# Patient Record
Sex: Male | Born: 1970 | Race: White | Hispanic: No | Marital: Married | Smoking: Never smoker
Health system: Southern US, Community
[De-identification: ages and names within clinical notes are randomized; demographics above are authoritative.]

## PROBLEM LIST (undated history)

## (undated) DIAGNOSIS — F40240 Claustrophobia: Secondary | ICD-10-CM

## (undated) DIAGNOSIS — K8020 Calculus of gallbladder without cholecystitis without obstruction: Secondary | ICD-10-CM

## (undated) DIAGNOSIS — R55 Syncope and collapse: Secondary | ICD-10-CM

## (undated) DIAGNOSIS — Z87442 Personal history of urinary calculi: Secondary | ICD-10-CM

## (undated) DIAGNOSIS — G4700 Insomnia, unspecified: Secondary | ICD-10-CM

## (undated) DIAGNOSIS — R109 Unspecified abdominal pain: Secondary | ICD-10-CM

## (undated) DIAGNOSIS — M19019 Primary osteoarthritis, unspecified shoulder: Secondary | ICD-10-CM

## (undated) HISTORY — DX: Unspecified abdominal pain: R10.9

## (undated) HISTORY — DX: Syncope and collapse: R55

## (undated) HISTORY — DX: Calculus of gallbladder without cholecystitis without obstruction: K80.20

## (undated) HISTORY — PX: HERNIA REPAIR: SHX51

## (undated) HISTORY — PX: WISDOM TOOTH EXTRACTION: SHX21

## (undated) HISTORY — DX: Insomnia, unspecified: G47.00

---

## 2006-09-10 ENCOUNTER — Emergency Department (HOSPITAL_COMMUNITY): Admission: EM | Admit: 2006-09-10 | Discharge: 2006-09-10 | Payer: Self-pay | Admitting: Emergency Medicine

## 2008-11-28 ENCOUNTER — Encounter: Admission: RE | Admit: 2008-11-28 | Discharge: 2008-11-28 | Payer: Self-pay | Admitting: Orthopedic Surgery

## 2009-01-08 ENCOUNTER — Emergency Department (HOSPITAL_COMMUNITY): Admission: EM | Admit: 2009-01-08 | Discharge: 2009-01-08 | Payer: Self-pay | Admitting: Emergency Medicine

## 2010-10-29 LAB — CBC
HCT: 39.7 % (ref 39.0–52.0)
Hemoglobin: 13.4 g/dL (ref 13.0–17.0)
MCHC: 33.7 g/dL (ref 30.0–36.0)
MCV: 93.1 fL (ref 78.0–100.0)
Platelets: 154 10*3/uL (ref 150–400)
RBC: 4.26 MIL/uL (ref 4.22–5.81)

## 2010-10-29 LAB — COMPREHENSIVE METABOLIC PANEL
AST: 21 U/L (ref 0–37)
BUN: 13 mg/dL (ref 6–23)
Calcium: 9 mg/dL (ref 8.4–10.5)
GFR calc non Af Amer: >60 mL/min (ref >60)
Glucose, Bld: 140 mg/dL — ABNORMAL HIGH (ref 70–99)
Total Bilirubin: 0.8 mg/dL (ref 0.3–1.2)

## 2010-10-29 LAB — DIFFERENTIAL
Basophils Relative: 0 % (ref 0–1)
Eosinophils Absolute: 0.1 10*3/uL (ref 0.0–0.7)
Monocytes Absolute: 0.7 10*3/uL (ref 0.1–1.0)
Neutro Abs: 8 10*3/uL — ABNORMAL HIGH (ref 1.7–7.7)
Neutrophils Relative %: 78 % — ABNORMAL HIGH (ref 43–77)

## 2010-10-29 LAB — URINALYSIS, ROUTINE W REFLEX MICROSCOPIC
Glucose, UA: NEGATIVE mg/dL
Nitrite: NEGATIVE
Specific Gravity, Urine: 1.028 (ref 1.005–1.030)
Urobilinogen, UA: 1 mg/dL (ref 0.0–1.0)

## 2012-10-16 ENCOUNTER — Encounter (INDEPENDENT_AMBULATORY_CARE_PROVIDER_SITE_OTHER): Payer: Self-pay

## 2012-11-02 ENCOUNTER — Encounter (INDEPENDENT_AMBULATORY_CARE_PROVIDER_SITE_OTHER): Payer: Self-pay | Admitting: General Surgery

## 2012-11-02 ENCOUNTER — Ambulatory Visit (INDEPENDENT_AMBULATORY_CARE_PROVIDER_SITE_OTHER): Payer: 59 | Admitting: General Surgery

## 2012-11-02 ENCOUNTER — Encounter (INDEPENDENT_AMBULATORY_CARE_PROVIDER_SITE_OTHER): Payer: Self-pay

## 2012-11-02 ENCOUNTER — Telehealth (INDEPENDENT_AMBULATORY_CARE_PROVIDER_SITE_OTHER): Payer: Self-pay

## 2012-11-02 VITALS — BP 118/72 | HR 45 | Temp 97.4°F | Resp 16 | Ht 70.0 in | Wt 208.4 lb

## 2012-11-02 DIAGNOSIS — K8020 Calculus of gallbladder without cholecystitis without obstruction: Secondary | ICD-10-CM

## 2012-11-02 DIAGNOSIS — K810 Acute cholecystitis: Secondary | ICD-10-CM | POA: Insufficient documentation

## 2012-11-02 NOTE — Telephone Encounter (Signed)
Pt has appt today for GB eval with Dr Abbey Chatters. Pt was seen at [facility] for GB attack last night. They are being released. U/S and labs were done an pt will bring these results with him to todays appt. Family just wanted TR to know. I advised pts daughter I will sent this msg to TR and his assistant.

## 2012-11-02 NOTE — Progress Notes (Signed)
Patient ID: Paul [name], male   DOB: 03-24-1971, 42 y.o.   MRN: [id number]  Chief Complaint  Patient presents with  . New Evaluation    eval GB    HPI Paul [name] is a 42 y.o. male.   HPI He is referred by Dr. Renne Crigler for further evaluation and treatment of symptomatic cholelithiasis.  He has had known gallstones for many years and has been seen by a surgeon in the past.  He did not want to have surgery at that time.  He has had multiple gallbladder attacks since Thanksgiving. He had a severe attack last night after eating a steak, baked potato sour cream and powder, and a salad with ranch dressing. He was in the ED in [facility].  Ultrasound demonstrated cholelithiasis. Liver function tests were normal. White blood cell count was slightly elevated at 12,900. He's feeling a little better now.  Past Medical History  Diagnosis Date  . Abdominal pain   . Cholelithiasis   . Insomnia     Past Surgical History  Procedure Laterality Date  . Hernia repair      Childhood.    Family History  Problem Relation Age of Onset  . Cancer Father     lung    Social History History  Substance Use Topics  . Smoking status: Never Smoker   . Smokeless tobacco: Never Used  . Alcohol Use: Yes     Comment: 1-2 drinks/week    Allergies  Allergen Reactions  . Dilaudid (Hydromorphone Hcl) Anaphylaxis  . Lunesta (Eszopiclone) Other (See Comments)    Bad taste    Current Outpatient Prescriptions  Medication Sig Dispense Refill  . HYDROcodone-acetaminophen (NORCO/VICODIN) 5-325 MG per tablet Take 1 tablet by mouth every 8 (eight) hours as needed for pain.      Marland Kitchen oxyCODONE-acetaminophen (PERCOCET/ROXICET) 5-325 MG per tablet Take 1 tablet by mouth every 4 (four) hours as needed for pain.      Marland Kitchen zolpidem (AMBIEN) 10 MG tablet Take 10 mg by mouth at bedtime as needed for sleep. 1/2-1 tablet as needed      . Ascorbic Acid 500 MG CHEW Chew by mouth.      . flunisolide (NASALIDE) 25 MCG/ACT (0.025%)  SOLN Inhale 2 sprays into the lungs as needed. 2 drops      . ondansetron (ZOFRAN) 4 MG tablet Take 4 mg by mouth every 8 (eight) hours as needed for nausea.       No current facility-administered medications for this visit.    Review of Systems Review of Systems  Constitutional: Positive for fatigue.  Respiratory: Negative.   Cardiovascular: Negative.   Gastrointestinal: Positive for nausea and vomiting.  Endocrine: Negative.   Genitourinary: Negative.   Neurological: Negative.   Hematological: Negative.     Blood pressure 118/72, pulse 45, temperature 97.4 F (36.3 C), temperature source Temporal, resp. rate 16, height 5\' 10"  (1.778 m), weight 208 lb 6.4 oz (94.53 kg).  Physical Exam Physical Exam  Constitutional: No distress.  Tired appearing.  HENT:  Head: Normocephalic and atraumatic.  Eyes: No scleral icterus.  Cardiovascular: Normal rate and regular rhythm.   Pulmonary/Chest: Effort normal and breath sounds normal.  Abdominal: Soft. He exhibits no distension and no mass. There is no tenderness.  Skin: Skin is warm and dry.    Data Reviewed Dr. Carolee Rota note.  Notes from [facility].  Assessment    Symptomatic cholelithiasis with repeated episodes.     Plan  I recommended laparoscopic cholecystectomy.  I have explained the procedure, risks, and aftercare of cholecystectomy.  Risks include but are not limited to bleeding, infection, wound problems, anesthesia, diarrhea, bile leak, injury to common bile duct/liver/intestine.  He seems to understand and agrees to proceed.         Juergen Hardenbrook J 11/02/2012, 2:54 PM

## 2012-11-02 NOTE — Patient Instructions (Signed)
Strict nonfat to lowfat diet.

## 2012-11-10 ENCOUNTER — Encounter (HOSPITAL_COMMUNITY): Payer: Self-pay | Admitting: Pharmacy Technician

## 2012-11-18 ENCOUNTER — Telehealth (INDEPENDENT_AMBULATORY_CARE_PROVIDER_SITE_OTHER): Payer: Self-pay | Admitting: *Deleted

## 2012-11-18 ENCOUNTER — Encounter (INDEPENDENT_AMBULATORY_CARE_PROVIDER_SITE_OTHER): Payer: Self-pay

## 2012-11-18 ENCOUNTER — Encounter (HOSPITAL_COMMUNITY): Payer: Self-pay | Admitting: *Deleted

## 2012-11-18 NOTE — Telephone Encounter (Signed)
Patient called today to state he is having more consistent and increased pain at his gallbladder site. Patient given two options which were possibly moving his surgery up to this Friday which Paul [name] will work on or going to the ED if pain is unbearable.  Patient states at this time he wants to try to move up his surgery.  Paul [name] will work on this and let patient know.

## 2012-11-20 ENCOUNTER — Inpatient Hospital Stay (HOSPITAL_COMMUNITY): Admission: RE | Admit: 2012-11-20 | Payer: Self-pay | Source: Ambulatory Visit

## 2012-11-20 ENCOUNTER — Encounter (HOSPITAL_COMMUNITY): Payer: Self-pay | Admitting: Anesthesiology

## 2012-11-20 ENCOUNTER — Encounter (HOSPITAL_COMMUNITY)
Admission: RE | Disposition: A | Discharge status: Home / Self Care | Payer: Self-pay | Source: Ambulatory Visit | Attending: General Surgery

## 2012-11-20 ENCOUNTER — Ambulatory Visit (HOSPITAL_COMMUNITY): Payer: 59 | Admitting: Anesthesiology

## 2012-11-20 ENCOUNTER — Inpatient Hospital Stay (HOSPITAL_COMMUNITY)
Admission: RE | Admit: 2012-11-20 | Discharge: 2012-11-26 | DRG: 415 | Disposition: A | Discharge status: Home / Self Care | Payer: 59 | Source: Ambulatory Visit | Attending: General Surgery | Admitting: General Surgery

## 2012-11-20 ENCOUNTER — Encounter (HOSPITAL_COMMUNITY): Payer: Self-pay | Admitting: *Deleted

## 2012-11-20 ENCOUNTER — Ambulatory Visit (HOSPITAL_COMMUNITY): Payer: 59

## 2012-11-20 DIAGNOSIS — Z5331 Laparoscopic surgical procedure converted to open procedure: Secondary | ICD-10-CM

## 2012-11-20 DIAGNOSIS — D62 Acute posthemorrhagic anemia: Secondary | ICD-10-CM | POA: Diagnosis not present

## 2012-11-20 DIAGNOSIS — K8010 Calculus of gallbladder with chronic cholecystitis without obstruction: Principal | ICD-10-CM | POA: Diagnosis present

## 2012-11-20 DIAGNOSIS — Z79899 Other long term (current) drug therapy: Secondary | ICD-10-CM

## 2012-11-20 DIAGNOSIS — K8000 Calculus of gallbladder with acute cholecystitis without obstruction: Secondary | ICD-10-CM

## 2012-11-20 DIAGNOSIS — G4700 Insomnia, unspecified: Secondary | ICD-10-CM | POA: Diagnosis present

## 2012-11-20 HISTORY — PX: CHOLECYSTECTOMY: SHX55

## 2012-11-20 LAB — CBC
Hemoglobin: 12.8 g/dL — ABNORMAL LOW (ref 13.0–17.0)
MCHC: 36.1 g/dL — ABNORMAL HIGH (ref 30.0–36.0)
WBC: 8.1 10*3/uL (ref 4.0–10.5)

## 2012-11-20 LAB — SURGICAL PCR SCREEN
MRSA, PCR: INVALID — AB
Staphylococcus aureus: INVALID — AB

## 2012-11-20 SURGERY — LAPAROSCOPIC CHOLECYSTECTOMY WITH INTRAOPERATIVE CHOLANGIOGRAM
Anesthesia: General | Wound class: Contaminated

## 2012-11-20 MED ORDER — CEFAZOLIN SODIUM-DEXTROSE 2-3 GM-% IV SOLR
2.0000 g | INTRAVENOUS | Status: AC
  Administered 2012-11-20: 2 g via INTRAVENOUS

## 2012-11-20 MED ORDER — GLYCOPYRROLATE 0.2 MG/ML IJ SOLN
INTRAMUSCULAR | Status: DC | PRN
  Administered 2012-11-20: .7 mg via INTRAVENOUS

## 2012-11-20 MED ORDER — SODIUM CHLORIDE 0.9 % IJ SOLN: 9.0000 mL | INTRAMUSCULAR | Status: DC | PRN

## 2012-11-20 MED ORDER — SODIUM CHLORIDE 0.9 % IV SOLN
INTRAVENOUS | Status: AC
  Filled 2012-11-20: qty 3

## 2012-11-20 MED ORDER — NALOXONE HCL 0.4 MG/ML IJ SOLN: 0.4000 mg | INTRAMUSCULAR | Status: DC | PRN

## 2012-11-20 MED ORDER — MUPIROCIN 2 % EX OINT
TOPICAL_OINTMENT | CUTANEOUS | Status: AC
  Filled 2012-11-20: qty 22

## 2012-11-20 MED ORDER — SODIUM CHLORIDE 0.9 % IV SOLN
3.0000 g | INTRAVENOUS | Status: DC | PRN
  Administered 2012-11-20: 3 g via INTRAVENOUS

## 2012-11-20 MED ORDER — LACTATED RINGERS IV SOLN
INTRAVENOUS | Status: DC | PRN
  Administered 2012-11-20 (×2): via INTRAVENOUS

## 2012-11-20 MED ORDER — LIDOCAINE HCL (CARDIAC) 20 MG/ML IV SOLN
INTRAVENOUS | Status: DC | PRN
  Administered 2012-11-20: 60 mg via INTRAVENOUS

## 2012-11-20 MED ORDER — LACTATED RINGERS IV SOLN
INTRAVENOUS | Status: DC | PRN
  Administered 2012-11-20: 1000 mL via INTRAVENOUS

## 2012-11-20 MED ORDER — KCL-LACTATED RINGERS-D5W 20 MEQ/L IV SOLN
INTRAVENOUS | Status: DC
  Administered 2012-11-20 – 2012-11-25 (×9): via INTRAVENOUS
  Filled 2012-11-20 (×15): qty 1000

## 2012-11-20 MED ORDER — BUPIVACAINE HCL (PF) 0.5 % IJ SOLN
INTRAMUSCULAR | Status: AC
  Filled 2012-11-20: qty 30

## 2012-11-20 MED ORDER — MIDAZOLAM HCL 5 MG/5ML IJ SOLN
INTRAMUSCULAR | Status: DC | PRN
  Administered 2012-11-20 (×2): 1 mg via INTRAVENOUS

## 2012-11-20 MED ORDER — SODIUM CHLORIDE 0.9 % IV SOLN
3.0000 g | Freq: Four times a day (QID) | INTRAVENOUS | Status: DC
  Administered 2012-11-20 – 2012-11-25 (×20): 3 g via INTRAVENOUS
  Filled 2012-11-20 (×21): qty 3

## 2012-11-20 MED ORDER — CEFAZOLIN SODIUM-DEXTROSE 2-3 GM-% IV SOLR
INTRAVENOUS | Status: AC
  Filled 2012-11-20: qty 50

## 2012-11-20 MED ORDER — ACETAMINOPHEN 10 MG/ML IV SOLN
INTRAVENOUS | Status: DC | PRN
  Administered 2012-11-20: 1000 mg via INTRAVENOUS

## 2012-11-20 MED ORDER — IOHEXOL 300 MG/ML  SOLN
INTRAMUSCULAR | Status: AC
  Filled 2012-11-20: qty 1

## 2012-11-20 MED ORDER — ONDANSETRON HCL 4 MG/2ML IJ SOLN: 4.0000 mg | INTRAMUSCULAR | Status: DC | PRN

## 2012-11-20 MED ORDER — PROPOFOL 10 MG/ML IV BOLUS
INTRAVENOUS | Status: DC | PRN
  Administered 2012-11-20: 150 mg via INTRAVENOUS

## 2012-11-20 MED ORDER — DIPHENHYDRAMINE HCL 12.5 MG/5ML PO ELIX: 12.5000 mg | ORAL_SOLUTION | Freq: Four times a day (QID) | ORAL | Status: DC | PRN

## 2012-11-20 MED ORDER — FENTANYL CITRATE 0.05 MG/ML IJ SOLN
INTRAMUSCULAR | Status: AC
  Filled 2012-11-20: qty 2

## 2012-11-20 MED ORDER — FENTANYL CITRATE 0.05 MG/ML IJ SOLN
25.0000 ug | INTRAMUSCULAR | Status: DC | PRN
  Administered 2012-11-20 (×4): 50 ug via INTRAVENOUS

## 2012-11-20 MED ORDER — MORPHINE SULFATE (PF) 1 MG/ML IV SOLN
INTRAVENOUS | Status: AC
  Administered 2012-11-20: 15 mg
  Filled 2012-11-20: qty 25

## 2012-11-20 MED ORDER — FENTANYL CITRATE 0.05 MG/ML IJ SOLN
INTRAMUSCULAR | Status: DC | PRN
  Administered 2012-11-20 (×2): 50 ug via INTRAVENOUS
  Administered 2012-11-20: 100 ug via INTRAVENOUS
  Administered 2012-11-20: 150 ug via INTRAVENOUS

## 2012-11-20 MED ORDER — DIPHENHYDRAMINE HCL 50 MG/ML IJ SOLN: 12.5000 mg | Freq: Four times a day (QID) | INTRAMUSCULAR | Status: DC | PRN

## 2012-11-20 MED ORDER — ROCURONIUM BROMIDE 100 MG/10ML IV SOLN
INTRAVENOUS | Status: DC | PRN
  Administered 2012-11-20: 5 mg via INTRAVENOUS
  Administered 2012-11-20: 50 mg via INTRAVENOUS
  Administered 2012-11-20 (×4): 5 mg via INTRAVENOUS

## 2012-11-20 MED ORDER — ONDANSETRON HCL 4 MG PO TABS: 4.0000 mg | ORAL_TABLET | Freq: Four times a day (QID) | ORAL | Status: DC | PRN

## 2012-11-20 MED ORDER — LACTATED RINGERS IV SOLN: INTRAVENOUS | Status: DC

## 2012-11-20 MED ORDER — ONDANSETRON HCL 4 MG/2ML IJ SOLN: 4.0000 mg | Freq: Four times a day (QID) | INTRAMUSCULAR | Status: DC | PRN

## 2012-11-20 MED ORDER — ENOXAPARIN SODIUM 40 MG/0.4ML ~~LOC~~ SOLN
40.0000 mg | SUBCUTANEOUS | Status: DC
  Administered 2012-11-21 – 2012-11-25 (×5): 40 mg via SUBCUTANEOUS
  Filled 2012-11-20 (×6): qty 0.4

## 2012-11-20 MED ORDER — FENTANYL CITRATE 0.05 MG/ML IJ SOLN: 25.0000 ug | INTRAMUSCULAR | Status: DC | PRN

## 2012-11-20 MED ORDER — BUPIVACAINE HCL 0.5 % IJ SOLN
INTRAMUSCULAR | Status: DC | PRN
  Administered 2012-11-20: 15 mL

## 2012-11-20 MED ORDER — ACETAMINOPHEN 10 MG/ML IV SOLN
INTRAVENOUS | Status: AC
  Filled 2012-11-20: qty 100

## 2012-11-20 MED ORDER — MORPHINE SULFATE 10 MG/ML IJ SOLN
INTRAMUSCULAR | Status: AC
  Administered 2012-11-20: 4 mg via INTRAVENOUS
  Administered 2012-11-20 (×3): 2 mg via INTRAVENOUS
  Filled 2012-11-20: qty 1

## 2012-11-20 MED ORDER — NEOSTIGMINE METHYLSULFATE 1 MG/ML IJ SOLN
INTRAMUSCULAR | Status: DC | PRN
  Administered 2012-11-20: 4 mg via INTRAVENOUS

## 2012-11-20 MED ORDER — ONDANSETRON HCL 4 MG/2ML IJ SOLN
INTRAMUSCULAR | Status: DC | PRN
  Administered 2012-11-20: 4 mg via INTRAVENOUS

## 2012-11-20 MED ORDER — MUPIROCIN 2 % EX OINT
TOPICAL_OINTMENT | Freq: Once | CUTANEOUS | Status: AC
  Administered 2012-11-20: 1 via NASAL
  Filled 2012-11-20: qty 22

## 2012-11-20 MED ORDER — MORPHINE SULFATE (PF) 1 MG/ML IV SOLN
INTRAVENOUS | Status: DC
  Administered 2012-11-20: 1 mg via INTRAVENOUS
  Administered 2012-11-20: 16 mg via INTRAVENOUS
  Administered 2012-11-20: 5.06 mg via INTRAVENOUS
  Administered 2012-11-21: 20 mg via INTRAVENOUS
  Administered 2012-11-21: 15 mg via INTRAVENOUS
  Administered 2012-11-21: 10 mg via INTRAVENOUS
  Administered 2012-11-21: 17:00:00 via INTRAVENOUS
  Administered 2012-11-21: 14.64 mg via INTRAVENOUS
  Administered 2012-11-21: 21.64 mg via INTRAVENOUS
  Administered 2012-11-22: 4.48 mg via INTRAVENOUS
  Administered 2012-11-22: 26.5 mg via INTRAVENOUS
  Administered 2012-11-22: 11.59 mg via INTRAVENOUS
  Administered 2012-11-22: 11 mg via INTRAVENOUS
  Administered 2012-11-23: 6 mg via INTRAVENOUS
  Administered 2012-11-23: 18 mg via INTRAVENOUS
  Administered 2012-11-23: 04:00:00 via INTRAVENOUS
  Administered 2012-11-23: 17 mg via INTRAVENOUS
  Administered 2012-11-23: 08:00:00 via INTRAVENOUS
  Administered 2012-11-23 (×2): 12 mg via INTRAVENOUS
  Administered 2012-11-23: 16:00:00 via INTRAVENOUS
  Administered 2012-11-23: 9 mg via INTRAVENOUS
  Administered 2012-11-24: 1.98 mg via INTRAVENOUS
  Administered 2012-11-24: 2 mg via INTRAVENOUS
  Filled 2012-11-20 (×10): qty 25

## 2012-11-20 MED ORDER — IOHEXOL 300 MG/ML  SOLN
INTRAMUSCULAR | Status: DC | PRN
  Administered 2012-11-20: 25 mL via INTRAVENOUS

## 2012-11-20 MED ORDER — PANTOPRAZOLE SODIUM 40 MG IV SOLR
40.0000 mg | INTRAVENOUS | Status: DC
  Administered 2012-11-20 – 2012-11-25 (×6): 40 mg via INTRAVENOUS
  Filled 2012-11-20 (×7): qty 40

## 2012-11-20 SURGICAL SUPPLY — 62 items
APPLIER CLIP 5 13 M/L LIGAMAX5 (MISCELLANEOUS) ×2
APPLIER CLIP ROT 10 11.4 M/L (STAPLE) ×2
BENZOIN TINCTURE PRP APPL 2/3 (GAUZE/BANDAGES/DRESSINGS) ×2 IMPLANT
BLADE EXTENDED COATED 6.5IN (ELECTRODE) ×2 IMPLANT
CANISTER SUCTION 2500CC (MISCELLANEOUS) ×2 IMPLANT
CATH REDDICK CHOLANGI 4FR 50CM (CATHETERS) ×2 IMPLANT
CHLORAPREP W/TINT 26ML (MISCELLANEOUS) ×2 IMPLANT
CLIP APPLIE 5 13 M/L LIGAMAX5 (MISCELLANEOUS) ×1 IMPLANT
CLIP APPLIE ROT 10 11.4 M/L (STAPLE) ×1 IMPLANT
CLIP TI LARGE 6 (CLIP) ×4 IMPLANT
CLIP TI MEDIUM 6 (CLIP) ×4 IMPLANT
CLOTH BEACON ORANGE TIMEOUT ST (SAFETY) ×2 IMPLANT
COVER MAYO STAND STRL (DRAPES) IMPLANT
COVER SURGICAL LIGHT HANDLE (MISCELLANEOUS) IMPLANT
DECANTER SPIKE VIAL GLASS SM (MISCELLANEOUS) ×2 IMPLANT
DISSECTOR BLUNT TIP ENDO 5MM (MISCELLANEOUS) ×2 IMPLANT
DRAIN CHANNEL RND F F (WOUND CARE) ×2 IMPLANT
DRAPE C-ARM 42X72 X-RAY (DRAPES) ×2 IMPLANT
DRAPE LAPAROSCOPIC ABDOMINAL (DRAPES) ×2 IMPLANT
DRAPE UTILITY XL STRL (DRAPES) ×2 IMPLANT
DRSG TEGADERM 2-3/8X2-3/4 SM (GAUZE/BANDAGES/DRESSINGS) ×8 IMPLANT
ELECT BLADE TIP CTD 4 INCH (ELECTRODE) ×2 IMPLANT
ELECT REM PT RETURN 9FT ADLT (ELECTROSURGICAL) ×2
ELECTRODE REM PT RTRN 9FT ADLT (ELECTROSURGICAL) ×1 IMPLANT
ENDOLOOP SUT PDS II  0 18 (SUTURE)
ENDOLOOP SUT PDS II 0 18 (SUTURE) IMPLANT
EVACUATOR SILICONE 100CC (DRAIN) ×2 IMPLANT
GLOVE BIOGEL PI IND STRL 7.0 (GLOVE) ×1 IMPLANT
GLOVE BIOGEL PI INDICATOR 7.0 (GLOVE) ×1
GLOVE ECLIPSE 8.0 STRL XLNG CF (GLOVE) ×4 IMPLANT
GLOVE INDICATOR 8.0 STRL GRN (GLOVE) ×6 IMPLANT
GOWN STRL NON-REIN LRG LVL3 (GOWN DISPOSABLE) ×2 IMPLANT
GOWN STRL REIN XL XLG (GOWN DISPOSABLE) ×4 IMPLANT
HEMOSTAT SNOW SURGICEL 2X4 (HEMOSTASIS) ×4 IMPLANT
HEMOSTAT SURGICEL 4X8 (HEMOSTASIS) IMPLANT
IV CATH 14GX2 1/4 (CATHETERS) ×4 IMPLANT
KIT BASIN OR (CUSTOM PROCEDURE TRAY) ×2 IMPLANT
NS IRRIG 1000ML POUR BTL (IV SOLUTION) IMPLANT
POUCH SPECIMEN RETRIEVAL 10MM (ENDOMECHANICALS) ×2 IMPLANT
SCISSORS ENDO CVD 5DCS (MISCELLANEOUS) ×2 IMPLANT
SET CHOLANGIOGRAPH MIX (MISCELLANEOUS) ×2 IMPLANT
SET IRRIG TUBING LAPAROSCOPIC (IRRIGATION / IRRIGATOR) ×2 IMPLANT
SLEEVE SURGEON STRL (DRAPES) ×4 IMPLANT
SOLUTION ANTI FOG 6CC (MISCELLANEOUS) ×2 IMPLANT
SPONGE GAUZE 4X4 12PLY (GAUZE/BANDAGES/DRESSINGS) ×4 IMPLANT
SPONGE LAP 18X18 X RAY DECT (DISPOSABLE) ×6 IMPLANT
STAPLER VISISTAT 35W (STAPLE) ×2 IMPLANT
STRIP CLOSURE SKIN 1/2X4 (GAUZE/BANDAGES/DRESSINGS) ×2 IMPLANT
SUCTION POOLE TIP (SUCTIONS) ×2 IMPLANT
SUT MNCRL AB 4-0 PS2 18 (SUTURE) ×2 IMPLANT
SUT NYLON 3 0 (SUTURE) ×2 IMPLANT
SUT PDS AB 1 CTX 36 (SUTURE) ×6 IMPLANT
SUT SILK 2 0 (SUTURE) ×1
SUT SILK 2-0 18XBRD TIE 12 (SUTURE) ×1 IMPLANT
SUT VIC AB 3-0 SH 18 (SUTURE) ×2 IMPLANT
TOWEL OR 17X26 10 PK STRL BLUE (TOWEL DISPOSABLE) ×2 IMPLANT
TRAY LAP CHOLE (CUSTOM PROCEDURE TRAY) ×2 IMPLANT
TROCAR BLADELESS OPT 5 75 (ENDOMECHANICALS) ×6 IMPLANT
TROCAR XCEL BLUNT TIP 100MML (ENDOMECHANICALS) ×2 IMPLANT
TROCAR XCEL NON-BLD 11X100MML (ENDOMECHANICALS) ×2 IMPLANT
TUBING INSUFFLATION 10FT LAP (TUBING) ×2 IMPLANT
YANKAUER SUCT BULB TIP 10FT TU (MISCELLANEOUS) ×2 IMPLANT

## 2012-11-20 NOTE — Interval H&P Note (Signed)
History and Physical Interval Note:  11/20/2012 12:51 PM  Paul [name]  has presented today for surgery, with the diagnosis of symtomatic cholelithiasis  The various methods of treatment have been discussed with the patient and family. After consideration of risks, benefits and other options for treatment, the patient has consented to  Procedure(s): LAPAROSCOPIC CHOLECYSTECTOMY WITH INTRAOPERATIVE CHOLANGIOGRAM (N/A) as a surgical intervention .  The patient's history has been reviewed, patient examined, no change in status, stable for surgery.  I have reviewed the patient's chart and labs.  Questions were answered to the patient's satisfaction.     Lynn Recendiz Shela Commons

## 2012-11-20 NOTE — Op Note (Signed)
Operative Note  Paul [name] male 42 y.o. 11/20/2012  PREOPERATIVE DX:   Symptomatic cholelithiasis  POSTOPERATIVE DX:  Subacute and chronic cholecystitis with cholelithiasis  PROCEDURE:  Laparoscopic converted to open cholecystectomy with cholangiogram         Surgeon: Adolph Pollack   Assistants: Lodema Pilot  Anesthesia: General endotracheal anesthesia and Marcaine local anesthetic  Indications: This is a 42 year old male whose had multiple episodes of biliary colic for the past 5 months. He was seen in the office 2-1/2-3 weeks ago after having another episode. He was recovering from that. He was scheduled for elective cholecystectomy and presents for that.    Procedure Detail:  He was seen in the holding room. He was brought to the operating room placed supine on the operating table and a general anesthetic was administered. The hair on the abdominal wall was clipped. The abdominal wall was widely sterilely prepped and draped.  Marcaine was infiltrated into the subcutaneous tissues in the subumbilical region. A small subumbilical incision was made through the skin, subcutaneous tissue, fascia, and peritoneum entering the peritoneal cavity under direct vision. A pursestring suture of 0 Vicryl was placed around the fascial edges.  A Hassan trocar was introduced into the peritoneal cavity and pneumoperitoneum created by insufflation of CO2 gas. The laparoscope was introduced and there is no evidence of bleeding or underlying organ injury. A 5 mm trocar was then placed in the epigastrium. A 5 mm trocar was placed in the lateral right upper quadrant and one was placed in the medial right upper quadrant.  He was placed in reverse Trendelenburg position and the right side tilted slightly up.  The omentum was densely adherent to a inflamed indurated and distended gallbladder. The gallbladder was decompressed. Using careful blunt dissection and electrocautery the omentum was dissected free  from the gallbladder. The fundus of the gallbladder was grasped and retracted toward the right shoulder. There was a thick, adherent inflammatory peel that was adherent to the body and infundibulum of the gallbladder.  This was carefully dissected free using blunt dissection and hydrodissection.  The cystic artery was identified. A window was created around it close to the gallbladder. It was clipped and divided. A hole was placed in the gallbladder.  The 5 mm epigastric trocar was removed and replaced by an 11 mm epigastric trocar.  Two stones were noted to at around the neck of the gallbladder. These were removed after they fell out of the gallbladder. I continued to dissect the thick inflammatory peel free from the gallbladder and created a large window around the distal gallbladder.  I placed a cholangiocatheter through this small hole in the gallbladder but was unable to cannulate the cystic duct through this. I then continued the careful dissection. I dissected the inflammatory peel further down the gallbladder onto what I felt was the cystic duct.  A small incision was made in the cystic duct. There is no significant bile reflux. I placed a cholangiocatheter through this small incision. I seal this off with a clamp. I attempted to perform a cholangiogram but leaked around the catheter. I subsequently readjusted the clamp and attempted a second cholangiogram. This demonstrated filling of the common bile duct but no filling of the common hepatic duct. Because the anatomy was not clear, I elected to convert to an open procedure.  The trocars were removed.  Beginning at the epigastric trocar incision and made a right upper quadrant incision through the skin, subcutaneous tissue, anterior fascia, rectus muscle,  posterior fascia and peritoneum into the peritoneal cavity. A Bookwalter retractor was used for exposure. I began dissecting the gallbladder free in a dome down fashion. A large posterior wall was entered  and I dissected it distally. I then visualized the area were performed the incomplete cholangiogram and this appeared to be consistent with a cystic duct. I was able to identify the common hepatic duct. I then subsequently introduced the cholangiocatheter into the cystic duct. A cholangiogram was performed.  Under real-time fluoroscopy dilute contrast was injected to the cystic duct which appeared to be of moderate length. The common hepatic, right hepatic and common bile ducts all filled.  Contrast drained into the duodenum without evidence of leak or obstruction.  The cholangiocatheter was removed, a right angle was placed on the cystic duct and it was cut on the gallbladder side. The cystic duct was then suture ligated with 3-0 Vicryl suture. A large clip was then applied to the cystic duct as well.  The gallbladder was then dissected free from the liver using electrocautery. Some clips were placed on thick inflammatory peel close to the gallbladder. The gallbladder was inspected and I was able to see only one exit out the cystic duct area. I was able to identify this through the initial hole in the gallbladder.   The gallbladder fossa was inspected and bleeding was controlled with electrocautery. The gallbladder fossa and right upper quadrant area were copiously irrigated. I inspected the area and saw no evidence of intestinal injury. No evidence of bile leak. No evidence bleeding. Surgicel was placed in the gallbladder fossa.  A 19 Blake round drain was then placed through the right upper quadrant lateral incision and positioned in the gallbladder fossa. It was anchored to the skin with nylon suture.  The subumbilical fascial defect was closed by tightening up and tying down the purse string suture.  The right upper quadrant fascia was closed in 2 layers. The posterior and anterior layer were closed with running #1 PDS suture. The subcutaneous tissue the right upper quadrant incision was irrigated  and the skin was closed with staples. The skin of the remaining trocar sites was closed with 4-0 Monocryl subcuticular stitches. Sterile dressings were applied. The drain was hooked up to closed suction.  He tolerated the procedure well without any apparent complications. He was taken to the recovery room in satisfactory condition. I did speak with him in the recovery room about the operation.  Findings:  Acute and chronic inflammatory changes   Estimated Blood Loss:  300 mL         Drains: JACKSON-PRATT (JP)  Blood Given: none          Specimens: Gallbladder        Complications:  * No complications entered in OR log *         Disposition: PACU - hemodynamically stable.         Condition: stable

## 2012-11-20 NOTE — Preoperative (Signed)
Beta Blockers   Reason not to administer Beta Blockers:Not Applicable 

## 2012-11-20 NOTE — H&P (View-Only) (Signed)
Patient ID: Paul [name], male   DOB: 12/22/1970, 42 y.o.   MRN: [id number]  Chief Complaint  Patient presents with  . New Evaluation    eval GB    HPI [name] is a 42 y.o. male.   HPI He is referred by Dr. Pharr for further evaluation and treatment of symptomatic cholelithiasis.  He has had known gallstones for many years and has been seen by a surgeon in the past.  He did not want to have surgery at that time.  He has had multiple gallbladder attacks since Thanksgiving. He had a severe attack last night after eating a steak, baked potato sour cream and powder, and a salad with ranch dressing. He was in the ED in [place].  Ultrasound demonstrated cholelithiasis. Liver function tests were normal. White blood cell count was slightly elevated at 12,900. He's feeling a little better now.  Past Medical History  Diagnosis Date  . Abdominal pain   . Cholelithiasis   . Insomnia     Past Surgical History  Procedure Laterality Date  . Hernia repair      Childhood.    Family History  Problem Relation Age of Onset  . Cancer Father     lung    Social History History  Substance Use Topics  . Smoking status: Never Smoker   . Smokeless tobacco: Never Used  . Alcohol Use: Yes     Comment: 1-2 drinks/week    Allergies  Allergen Reactions  . Dilaudid (Hydromorphone Hcl) Anaphylaxis  . Lunesta (Eszopiclone) Other (See Comments)    Bad taste    Current Outpatient Prescriptions  Medication Sig Dispense Refill  . HYDROcodone-acetaminophen (NORCO/VICODIN) 5-325 MG per tablet Take 1 tablet by mouth every 8 (eight) hours as needed for pain.      . oxyCODONE-acetaminophen (PERCOCET/ROXICET) 5-325 MG per tablet Take 1 tablet by mouth every 4 (four) hours as needed for pain.      . zolpidem (AMBIEN) 10 MG tablet Take 10 mg by mouth at bedtime as needed for sleep. 1/2-1 tablet as needed      . Ascorbic Acid 500 MG CHEW Chew by mouth.      . flunisolide (NASALIDE) 25 MCG/ACT (0.025%)  SOLN Inhale 2 sprays into the lungs as needed. 2 drops      . ondansetron (ZOFRAN) 4 MG tablet Take 4 mg by mouth every 8 (eight) hours as needed for nausea.       No current facility-administered medications for this visit.    Review of Systems Review of Systems  Constitutional: Positive for fatigue.  Respiratory: Negative.   Cardiovascular: Negative.   Gastrointestinal: Positive for nausea and vomiting.  Endocrine: Negative.   Genitourinary: Negative.   Neurological: Negative.   Hematological: Negative.     Blood pressure 118/72, pulse 45, temperature 97.4 F (36.3 C), temperature source Temporal, resp. rate 16, height 5' 10" (1.778 m), weight 208 lb 6.4 oz (94.53 kg).  Physical Exam Physical Exam  Constitutional: No distress.  Tired appearing.  HENT:  Head: Normocephalic and atraumatic.  Eyes: No scleral icterus.  Cardiovascular: Normal rate and regular rhythm.   Pulmonary/Chest: Effort normal and breath sounds normal.  Abdominal: Soft. He exhibits no distension and no mass. There is no tenderness.  Skin: Skin is warm and dry.    Data Reviewed Dr. Pharr's note.  Notes from [place] [facility].  Assessment    Symptomatic cholelithiasis with repeated episodes.     Plan      I recommended laparoscopic cholecystectomy.  I have explained the procedure, risks, and aftercare of cholecystectomy.  Risks include but are not limited to bleeding, infection, wound problems, anesthesia, diarrhea, bile leak, injury to common bile duct/liver/intestine.  He seems to understand and agrees to proceed.         Iver Miklas J 11/02/2012, 2:54 PM    

## 2012-11-20 NOTE — Anesthesia Postprocedure Evaluation (Signed)
  Anesthesia Post-op Note  Patient: Paul [name]  Procedure(s) Performed: Procedure(s) (LRB): LAPAROSCOPIC CHOLECYSTECTOMY converted to open cholecystectomy with  CHOLANGIOGRAM (N/A)  Patient Location: PACU  Anesthesia Type: General  Level of Consciousness: awake and alert   Airway and Oxygen Therapy: Patient Spontanous Breathing  Post-op Pain: mild  Post-op Assessment: Post-op Vital signs reviewed, Patient's Cardiovascular Status Stable, Respiratory Function Stable, Patent Airway and No signs of Nausea or vomiting  Last Vitals:  Filed Vitals:   11/20/12 1815  BP: 142/86  Pulse: 86  Temp:   Resp: 16    Post-op Vital Signs: stable   Complications: No apparent anesthesia complications

## 2012-11-20 NOTE — Anesthesia Preprocedure Evaluation (Addendum)
Anesthesia Evaluation  Patient identified by MRN, date of birth, ID band Patient awake    Reviewed: Allergy & Precautions, H&P , NPO status , Patient's Chart, lab work & pertinent test results  Airway Mallampati: II TM Distance: >3 FB Neck ROM: Full    Dental no notable dental hx.    Pulmonary neg pulmonary ROS,  breath sounds clear to auscultation  Pulmonary exam normal       Cardiovascular Exercise Tolerance: Good negative cardio ROS  Rhythm:Regular Rate:Normal     Neuro/Psych negative neurological ROS  negative psych ROS   GI/Hepatic negative GI ROS, Neg liver ROS,   Endo/Other  negative endocrine ROS  Renal/GU negative Renal ROS  negative genitourinary   Musculoskeletal negative musculoskeletal ROS (+)   Abdominal   Peds negative pediatric ROS (+)  Hematology negative hematology ROS (+)   Anesthesia Other Findings   Reproductive/Obstetrics negative OB ROS                           Anesthesia Physical Anesthesia Plan  ASA: II  Anesthesia Plan: General   Post-op Pain Management:    Induction: Intravenous  Airway Management Planned: Oral ETT  Additional Equipment:   Intra-op Plan:   Post-operative Plan: Extubation in OR  Informed Consent: I have reviewed the patients History and Physical, chart, labs and discussed the procedure including the risks, benefits and alternatives for the proposed anesthesia with the patient or authorized representative who has indicated his/her understanding and acceptance.   Dental advisory given  Plan Discussed with: CRNA  Anesthesia Plan Comments:         Anesthesia Quick Evaluation  

## 2012-11-20 NOTE — Transfer of Care (Signed)
Immediate Anesthesia Transfer of Care Note  Patient: Paul [name]  Procedure(s) Performed: Procedure(s) (LRB): LAPAROSCOPIC CHOLECYSTECTOMY converted to open cholecystectomy with  CHOLANGIOGRAM (N/A)  Patient Location: PACU  Anesthesia Type: General  Level of Consciousness: sedated, patient cooperative and responds to stimulaton  Airway & Oxygen Therapy: Patient Spontanous Breathing and Patient connected to face mask oxgen  Post-op Assessment: Report given to PACU RN and Post -op Vital signs reviewed and stable  Post vital signs: Reviewed and stable  Complications: No apparent anesthesia complications

## 2012-11-21 ENCOUNTER — Encounter (HOSPITAL_COMMUNITY): Payer: Self-pay | Admitting: Gastroenterology

## 2012-11-21 LAB — CBC
MCV: 86.7 fL (ref 78.0–100.0)
Platelets: 204 10*3/uL (ref 150–400)
RDW: 12.9 % (ref 11.5–15.5)
WBC: 11.1 10*3/uL — ABNORMAL HIGH (ref 4.0–10.5)

## 2012-11-21 MED ORDER — ACETAMINOPHEN 10 MG/ML IV SOLN
1000.0000 mg | Freq: Four times a day (QID) | INTRAVENOUS | Status: AC
  Administered 2012-11-21 – 2012-11-22 (×4): 1000 mg via INTRAVENOUS
  Filled 2012-11-21 (×4): qty 100

## 2012-11-21 MED ORDER — SODIUM CHLORIDE 0.9 % IV SOLN: INTRAVENOUS | Status: DC

## 2012-11-21 MED ORDER — BIOTENE DRY MOUTH MT LIQD
15.0000 mL | Freq: Two times a day (BID) | OROMUCOSAL | Status: DC
  Administered 2012-11-21 – 2012-11-24 (×7): 15 mL via OROMUCOSAL

## 2012-11-21 NOTE — Progress Notes (Signed)
Pt has temperature 101.4. Pt encouraged to continue to use Incentive Spirometer. Paged MD on call to make aware of temperature. No new orders received at this time. Will continue to monitor. Kimyatta Lecy M

## 2012-11-21 NOTE — Progress Notes (Signed)
1 Day Post-Op  Subjective: Feeling okay from his procedure.  Asking for some food or drink.  Pain controlled on PCA  Objective: Vital signs in last 24 hours: Temp:  [97.9 F (36.6 C)-102.1 F (38.9 C)] 102.1 F (38.9 C) (05/03 0954) Pulse Rate:  [64-117] 115 (05/03 0954) Resp:  [11-19] 18 (05/03 1130) BP: (120-151)/(76-89) 135/83 mmHg (05/03 0954) SpO2:  [96 %-100 %] 97 % (05/03 1130) Last BM Date: 11/15/12  Intake/Output from previous day: 05/02 0701 - 05/03 0700 In: 2831.3 [I.V.:2731.3; IV Piggyback:100] Out: 1210 [Urine:775; Drains:135; Blood:300] Intake/Output this shift: Total I/O In: 0  Out: 275 [Urine:200; Drains:75]  General appearance: alert, cooperative and no distress Resp: nonlabored Cardio: mild tachy, HR 102, regular GI: soft, ND, appropriate incisional tenderness, wound without infection, JP with dark output (looks bilious), no peritonitis  Lab Results:   Recent Labs  11/20/12 1010 11/21/12 0418  WBC 8.1 11.1*  HGB 12.8* 11.7*  HCT 35.5* 33.9*  PLT 182 204   BMET No results found for this basename: NA, K, CL, CO2, GLUCOSE, BUN, CREATININE, CALCIUM,  in the last 72 hours PT/INR No results found for this basename: LABPROT, INR,  in the last 72 hours ABG No results found for this basename: PHART, PCO2, PO2, HCO3,  in the last 72 hours  Studies/Results: Dg Cholangiogram Operative  11/20/2012  *RADIOLOGY REPORT*  Clinical Data:Laparoscopic cholecystectomy.  INTRAOPERATIVE CHOLANGIOGRAM  Technique:  Multiple fluoroscopic spot radiographs were obtained during intraoperative cholangiogram and are submitted for interpretation post-operatively.  Fluoroscopy Time: Technique:  C-arm fluoroscopic images were obtained intraoperatively and submitted for postoperative interpretation.  Please see the performing provider's procedural report for the fluoroscopy time utilized.  Comparison: Abdominal ultrasound 01/08/2009.  Findings: On the initial run, there is partial  opacification of the biliary system.  Access to the cystic duct remnant appears to have been lost during injection with resulting leakage into the peritoneal cavity.  A subsequent run demonstrates adequate opacification of the common bile duct and no evidence of retained calculus or definite leak.  IMPRESSION:  1.  No evidence of retained calculus or ductal obstruction. 2.  Apparent leakage from the injection site on the initial run, not seen on the subsequent injection.   Original Report Authenticated By: Carey Bullocks, M.D.     Anti-infectives: Anti-infectives   Start     Dose/Rate Route Frequency Ordered Stop   11/20/12 2000  Ampicillin-Sulbactam (UNASYN) 3 g in sodium chloride 0.9 % 100 mL IVPB     3 g 100 mL/hr over 60 Minutes Intravenous Every 6 hours 11/20/12 1852     11/20/12 0945  ceFAZolin (ANCEF) IVPB 2 g/50 mL premix     2 g 100 mL/hr over 30 Minutes Intravenous On call to O.R. 11/20/12 0945 11/20/12 1332      Assessment/Plan: s/p Procedure(s): LAPAROSCOPIC CHOLECYSTECTOMY converted to open cholecystectomy with  CHOLANGIOGRAM (N/A) Bilious drain output today.  will send fluid for bilirubin level but suspect cystic duct leak.  consult GI for possible ERCP and stent.  output seems to be controlled and no peritonitis.  His temps are likely related to the cholecystitis and to the bile leak.   urine is dark and only out.  Will bolus and leave foley catheter for urine output monitoring.   LOS: 1 day    Paul [name] 11/21/2012

## 2012-11-21 NOTE — Consult Note (Signed)
Reason for Consult: Bile leak Referring Physician: General surgery Dr. Bolivar Haw is an 42 y.o. male.  HPI: Patient seen at the request of the surgical team for a bile leak which currently seems to be decreasing and he seems to be doing fairly well postop and his Intra-Op cholangiogram was okay however his been having symptomatic gallstones for months but no previous GI workup and no other complaints  Past Medical History  Diagnosis Date  . Abdominal pain   . Cholelithiasis   . Insomnia     Past Surgical History  Procedure Laterality Date  . Hernia repair      Childhood./ inguinal    Family History  Problem Relation Age of Onset  . Cancer Father     lung    Social History:  reports that he has never smoked. He has never used smokeless tobacco. He reports that  drinks alcohol. He reports that he does not use illicit drugs.  Allergies:  Allergies  Allergen Reactions  . Dilaudid (Hydromorphone Hcl) Anaphylaxis  . Lunesta (Eszopiclone) Other (See Comments)    Bad taste    Medications: I have reviewed the patient's current medications.  Results for orders placed during the hospital encounter of 11/20/12 (from the past 48 hour(s))  CBC     Status: Abnormal   Collection Time    11/20/12 10:10 AM      Result Value Range   WBC 8.1  4.0 - 10.5 K/uL   RBC 4.16 (*) 4.22 - 5.81 MIL/uL   Hemoglobin 12.8 (*) 13.0 - 17.0 g/dL   HCT 40.9 (*) 81.1 - 91.4 %   MCV 85.3  78.0 - 100.0 fL   MCH 30.8  26.0 - 34.0 pg   MCHC 36.1 (*) 30.0 - 36.0 g/dL   RDW 78.2  95.6 - 21.3 %   Platelets 182  150 - 400 K/uL  SURGICAL PCR SCREEN     Status: Abnormal   Collection Time    11/20/12 10:10 AM      Result Value Range   MRSA, PCR INVALID RESULTS, SPECIMEN SENT FOR CULTURE (*) NEGATIVE   Staphylococcus aureus INVALID RESULTS, SPECIMEN SENT FOR CULTURE (*) NEGATIVE   Comment: P MENTLEY AT 1204 ON 05.02.2014 BY NBROOKS                The Xpert SA Assay (FDA     approved for NASAL  specimens     in patients over 70 years of age),     is one component of     a comprehensive surveillance     program.  Test performance has     been validated by [employer] for patients greater     than or equal to 21 year old.     It is not intended     to diagnose infection nor to     guide or monitor treatment.  CBC     Status: Abnormal   Collection Time    11/21/12  4:18 AM      Result Value Range   WBC 11.1 (*) 4.0 - 10.5 K/uL   RBC 3.91 (*) 4.22 - 5.81 MIL/uL   Hemoglobin 11.7 (*) 13.0 - 17.0 g/dL   HCT 08.6 (*) 57.8 - 46.9 %   MCV 86.7  78.0 - 100.0 fL   MCH 29.9  26.0 - 34.0 pg   MCHC 34.5  30.0 - 36.0 g/dL   RDW 12.9  11.5 - 15.5 %   Platelets 204  150 - 400 K/uL    Dg Cholangiogram Operative  11/20/2012  *RADIOLOGY REPORT*  Clinical Data:Laparoscopic cholecystectomy.  INTRAOPERATIVE CHOLANGIOGRAM  Technique:  Multiple fluoroscopic spot radiographs were obtained during intraoperative cholangiogram and are submitted for interpretation post-operatively.  Fluoroscopy Time: Technique:  C-arm fluoroscopic images were obtained intraoperatively and submitted for postoperative interpretation.  Please see the performing provider's procedural report for the fluoroscopy time utilized.  Comparison: Abdominal ultrasound 01/08/2009.  Findings: On the initial run, there is partial opacification of the biliary system.  Access to the cystic duct remnant appears to have been lost during injection with resulting leakage into the peritoneal cavity.  A subsequent run demonstrates adequate opacification of the common bile duct and no evidence of retained calculus or definite leak.  IMPRESSION:  1.  No evidence of retained calculus or ductal obstruction. 2.  Apparent leakage from the injection site on the initial run, not seen on the subsequent injection.   Original Report Authenticated By: Carey Bullocks, M.D.     ROS negative except above Blood pressure 114/73, pulse 87, temperature 99.6 F  (37.6 C), temperature source Oral, resp. rate 16, height 5\' 10"  (1.778 m), weight 85.957 kg (189 lb 8 oz), SpO2 98.00%. Physical Exam No acute distress lying comfortably in the bed abdomen is soft minimal discomfort throughout labs and Intra-Op cholangiogram reviewed Assessment/Plan: Probable bile leak seemingly decreasing Plan: The risks benefits methods of ERCP sphincterotomy and stenting was discussed including success rate was discussed with the patient and his sister and will proceed tomorrow a.m. unless no obvious leaking and please call me sooner if necessary and will allow clear liquids today but n.p.o. after midnight  Gram Siedlecki E 11/21/2012, 2:34 PM

## 2012-11-22 ENCOUNTER — Inpatient Hospital Stay (HOSPITAL_COMMUNITY): Payer: 59 | Admitting: Anesthesiology

## 2012-11-22 ENCOUNTER — Inpatient Hospital Stay (HOSPITAL_COMMUNITY): Payer: 59

## 2012-11-22 ENCOUNTER — Encounter (HOSPITAL_COMMUNITY)
Admission: RE | Disposition: A | Discharge status: Home / Self Care | Payer: Self-pay | Source: Ambulatory Visit | Attending: General Surgery

## 2012-11-22 ENCOUNTER — Encounter (HOSPITAL_COMMUNITY): Payer: Self-pay | Admitting: Gastroenterology

## 2012-11-22 ENCOUNTER — Encounter (HOSPITAL_COMMUNITY): Payer: Self-pay | Admitting: Anesthesiology

## 2012-11-22 HISTORY — PX: ERCP: SHX5425

## 2012-11-22 LAB — MRSA CULTURE

## 2012-11-22 LAB — CBC WITH DIFFERENTIAL/PLATELET
Basophils Absolute: 0 10*3/uL (ref 0.0–0.1)
Basophils Relative: 0 % (ref 0–1)
Lymphocytes Relative: 9 % — ABNORMAL LOW (ref 12–46)
MCHC: 34.3 g/dL (ref 30.0–36.0)
Neutro Abs: 7.9 10*3/uL — ABNORMAL HIGH (ref 1.7–7.7)
Neutrophils Relative %: 78 % — ABNORMAL HIGH (ref 43–77)
Platelets: 174 10*3/uL (ref 150–400)
RDW: 12.9 % (ref 11.5–15.5)
WBC: 10.2 10*3/uL (ref 4.0–10.5)

## 2012-11-22 LAB — COMPREHENSIVE METABOLIC PANEL
AST: 49 U/L — ABNORMAL HIGH (ref 0–37)
Albumin: 2.8 g/dL — ABNORMAL LOW (ref 3.5–5.2)
Chloride: 96 mEq/L (ref 96–112)
Creatinine, Ser: 0.87 mg/dL (ref 0.50–1.35)
Total Bilirubin: 0.6 mg/dL (ref 0.3–1.2)

## 2012-11-22 LAB — CBC
MCV: 87.5 fL (ref 78.0–100.0)
Platelets: 174 10*3/uL (ref 150–400)
RDW: 12.8 % (ref 11.5–15.5)
WBC: 9.7 10*3/uL (ref 4.0–10.5)

## 2012-11-22 LAB — TYPE AND SCREEN: Antibody Screen: NEGATIVE

## 2012-11-22 LAB — ABO/RH: ABO/RH(D): A NEG

## 2012-11-22 SURGERY — ERCP, WITH INTERVENTION IF INDICATED
Anesthesia: General

## 2012-11-22 MED ORDER — ROCURONIUM BROMIDE 100 MG/10ML IV SOLN
INTRAVENOUS | Status: DC | PRN
  Administered 2012-11-22: 20 mg via INTRAVENOUS

## 2012-11-22 MED ORDER — LACTATED RINGERS IV SOLN: INTRAVENOUS | Status: DC

## 2012-11-22 MED ORDER — SODIUM CHLORIDE 0.9 % IV SOLN
INTRAVENOUS | Status: DC | PRN
  Administered 2012-11-22: 10:00:00

## 2012-11-22 MED ORDER — LIDOCAINE HCL (CARDIAC) 20 MG/ML IV SOLN
INTRAVENOUS | Status: DC | PRN
  Administered 2012-11-22: 50 mg via INTRAVENOUS

## 2012-11-22 MED ORDER — PROPOFOL 10 MG/ML IV BOLUS
INTRAVENOUS | Status: DC | PRN
  Administered 2012-11-22: 150 mg via INTRAVENOUS

## 2012-11-22 MED ORDER — NEOSTIGMINE METHYLSULFATE 1 MG/ML IJ SOLN
INTRAMUSCULAR | Status: DC | PRN
  Administered 2012-11-22: 4 mg via INTRAVENOUS

## 2012-11-22 MED ORDER — GLYCOPYRROLATE 0.2 MG/ML IJ SOLN
INTRAMUSCULAR | Status: DC | PRN
  Administered 2012-11-22: .7 mg via INTRAVENOUS

## 2012-11-22 MED ORDER — SODIUM CHLORIDE 0.9 % IV BOLUS (SEPSIS)
1000.0000 mL | Freq: Once | INTRAVENOUS | Status: AC
  Administered 2012-11-22: 1000 mL via INTRAVENOUS

## 2012-11-22 MED ORDER — LACTATED RINGERS IV SOLN
INTRAVENOUS | Status: DC | PRN
  Administered 2012-11-22: 09:00:00 via INTRAVENOUS

## 2012-11-22 MED ORDER — FENTANYL CITRATE 0.05 MG/ML IJ SOLN
25.0000 ug | INTRAMUSCULAR | Status: DC | PRN
  Administered 2012-11-22 (×2): 50 ug via INTRAVENOUS

## 2012-11-22 MED ORDER — ONDANSETRON HCL 4 MG/2ML IJ SOLN
INTRAMUSCULAR | Status: DC | PRN
  Administered 2012-11-22: 4 mg via INTRAVENOUS

## 2012-11-22 MED ORDER — FENTANYL CITRATE 0.05 MG/ML IJ SOLN
INTRAMUSCULAR | Status: DC | PRN
  Administered 2012-11-22: 50 ug via INTRAVENOUS

## 2012-11-22 NOTE — Anesthesia Preprocedure Evaluation (Addendum)
Anesthesia Evaluation  Patient identified by MRN, date of birth, ID band Patient awake  General Assessment Comment:.  Abdominal pain     .  Cholelithiasis     .  Insomnia     Reviewed: Allergy & Precautions, H&P , NPO status , Patient's Chart, lab work & pertinent test results  Airway Mallampati: II TM Distance: >3 FB Neck ROM: Full    Dental no notable dental hx.    Pulmonary neg pulmonary ROS,  breath sounds clear to auscultation  Pulmonary exam normal       Cardiovascular negative cardio ROS  Rhythm:Regular Rate:Normal     Neuro/Psych negative neurological ROS  negative psych ROS   GI/Hepatic negative GI ROS, Neg liver ROS,   Endo/Other  negative endocrine ROS  Renal/GU Urine output has been low per Gunnar Fusi, the floor R.N.  Cr 0.87 this AM  negative genitourinary   Musculoskeletal negative musculoskeletal ROS (+)   Abdominal   Peds negative pediatric ROS (+)  Hematology negative hematology ROS (+)   Anesthesia Other Findings   Reproductive/Obstetrics negative OB ROS                         Anesthesia Physical Anesthesia Plan  ASA: II  Anesthesia Plan: General   Post-op Pain Management:    Induction: Intravenous  Airway Management Planned: Oral ETT  Additional Equipment:   Intra-op Plan:   Post-operative Plan: Extubation in OR  Informed Consent: I have reviewed the patients History and Physical, chart, labs and discussed the procedure including the risks, benefits and alternatives for the proposed anesthesia with the patient or authorized representative who has indicated his/her understanding and acceptance.   Dental advisory given  Plan Discussed with: CRNA  Anesthesia Plan Comments: (Foley bag has blood tinged urine. Discussed with Gunnar Fusi, the floor RN who will make sure Dr. Biagio Quint aware. )       Anesthesia Quick Evaluation

## 2012-11-22 NOTE — Progress Notes (Signed)
Dr. Biagio Quint aware via phone pt's foley recently emptied with 350 ml amber colored urine noted with clear, yellow urine in foley tube. MD also aware pt received bolus as ordered while in PACU. Status of JP drainage reported to MD. Fluid in JP bulb sanguineous. Continued pain in RT shoulder noted. No new orders received at present.

## 2012-11-22 NOTE — Anesthesia Postprocedure Evaluation (Signed)
  Anesthesia Post-op Note  Patient: Paul [name]  Procedure(s) Performed: Procedure(s) (LRB): ENDOSCOPIC RETROGRADE CHOLANGIOPANCREATOGRAPHY (ERCP) (N/A)  Patient Location: PACU  Anesthesia Type: General  Level of Consciousness: awake and alert   Airway and Oxygen Therapy: Patient Spontanous Breathing  Post-op Pain: mild  Post-op Assessment: Post-op Vital signs reviewed, Patient's Cardiovascular Status Stable, Respiratory Function Stable, Patent Airway and No signs of Nausea or vomiting  Last Vitals:  Filed Vitals:   11/22/12 1020  BP:   Pulse: 71  Temp:   Resp: 18    Post-op Vital Signs: stable   Complications: No apparent anesthesia complications

## 2012-11-22 NOTE — Transfer of Care (Signed)
Immediate Anesthesia Transfer of Care Note  Patient: Paul [name]  Procedure(s) Performed: Procedure(s) (LRB): ENDOSCOPIC RETROGRADE CHOLANGIOPANCREATOGRAPHY (ERCP) (N/A)  Patient Location: PACU  Anesthesia Type: General  Level of Consciousness: sedated, patient cooperative and responds to stimulaton  Airway & Oxygen Therapy: Patient Spontanous Breathing and Patient connected to face mask oxgen  Post-op Assessment: Report given to PACU RN and Post -op Vital signs reviewed and stable  Post vital signs: Reviewed and stable  Complications: No apparent anesthesia complications

## 2012-11-22 NOTE — Op Note (Signed)
[facility] [address] [place], [address]   ERCP PROCEDURE REPORT  PATIENT: Paul, [name].  MR# :[id number] BIRTHDATE: 1970-09-09  GENDER: Male ENDOSCOPIST: Vida Rigger, MD REFERRED BY: Lodema Pilot, D.O. PROCEDURE DATE:  11/22/2012 PROCEDURE:   ERCP with sphincterotomy/papillotomy and ERCP with stent placement ASA CLASS:    1 INDICATIONS: probable bile leak MEDICATIONS:    general anesthesia TOPICAL ANESTHETIC:  none  DESCRIPTION OF PROCEDURE:   After the risks benefits and alternatives of the procedure were thoroughly explained, informed consent was obtained.  The duodenoscope [id number]  endoscope was introduced through the mouth and advanced to the second portion of the duodenum .a slightly bulbous but overall normal appearing ampulla was brought into view and using the triple-lumen sphincterotome loaded with the JAG Jagwire and initially the wire seemed to go one time towards the pancreas and the sphincterotome was repositioned and deep selective cannulation was obtained and dye was inserted and a cholangiogram was obtained without obvious stone or bile leak but the duct was not overfilled and no cystic duct filling was obtained we then proceeded with a small sphincterotomy in the customary fashion until he had some biliary drainage andcould get the three quarters bowed sphincterotome easily in and out of the duct and the sphincterotome was removed and we placed a 10 French 5 cm stent in the customary fashion and it was confirmed both under fluoroscopy and endoscopic to be in the proper located and the introducer was removed and the wire was removed and the stent was draining appropriately the scope was removed no additional endoscopic findings were seen the patient tolerated the procedure well there was no obvious immediate complication      COMPLICATIONS:none  ENDOSCOPIC IMPRESSION:1 slightly bulbous ampulla otherwise no other endoscopic  findings 2. One wire advancement towards the pancreas without dye injection 3. Normal CBD and intrahepatics not overfilled but no cystic duct filling or obvious bile leak status post small sphincterotomy and stent placement  RECOMMENDATIONS:customary post-ERCP observation and if no delayed complications Will slowly advance dietand will check on tomorrow     _______________________________ eSigned:  Vida Rigger, MD 11/22/2012 10:08 AM   AO:[id number], Arlys John DO

## 2012-11-22 NOTE — Plan of Care (Signed)
Problem: Phase II Progression Outcomes Goal: Foley discontinued Outcome: Not Applicable Date Met:  11/22/12 See new orders regarding foley per Dr. Biagio Quint.

## 2012-11-22 NOTE — Progress Notes (Signed)
Paul [name] 9:03 AM  Subjective: Patient feels worse today with more pain but no other complaints  Objective: Vital signs stable afebrile not as alert as yesterday abdomen more tender than yesterday drain slightly lighter but more fluid than when I saw him yesterday labs stable  Assessment: Probable bile leak  Plan: I rediscussed the procedure with the patient and the Dr. Biagio [name] general surgery and we have elected to continue to proceed with the ERCP and stent today  [facility] E

## 2012-11-22 NOTE — Progress Notes (Signed)
2 Days Post-Op  Subjective: No new issues.  Tolerated liquids. Drain output less last night.  Clearing   Objective: Vital signs in last 24 hours: Temp:  [98.5 F (36.9 C)-102.1 F (38.9 C)] 99.4 F (37.4 C) (05/04 0525) Pulse Rate:  [79-115] 79 (05/04 0525) Resp:  [11-19] 11 (05/04 0744) BP: (110-135)/(71-83) 110/71 mmHg (05/04 0525) SpO2:  [96 %-99 %] 96 % (05/04 0744) Last BM Date: 11/15/12  Intake/Output from previous day: 05/03 0701 - 05/04 0700 In: 3545.4 [P.O.:60; I.V.:2885.4; IV Piggyback:600] Out: 1127 [Urine:1025; Drains:102] Intake/Output this shift:    General appearance: alert, cooperative and no distress Resp: nonlabored Cardio: normal rate, regular GI: soft, RUQ tenderness, ND, no peritoneal signs Extremities: SCD's bilat  Lab Results:   Recent Labs  11/21/12 0418 11/22/12 0412  WBC 11.1* 9.7  HGB 11.7* 9.7*  HCT 33.9* 28.7*  PLT 204 174   BMET  Recent Labs  11/22/12 0412  NA 132*  K 4.1  CL 96  CO2 31  GLUCOSE 133*  BUN 5*  CREATININE 0.87  CALCIUM 8.6   PT/INR No results found for this basename: LABPROT, INR,  in the last 72 hours ABG No results found for this basename: PHART, PCO2, PO2, HCO3,  in the last 72 hours  Studies/Results: Dg Cholangiogram Operative  11/20/2012  *RADIOLOGY REPORT*  Clinical Data:Laparoscopic cholecystectomy.  INTRAOPERATIVE CHOLANGIOGRAM  Technique:  Multiple fluoroscopic spot radiographs were obtained during intraoperative cholangiogram and are submitted for interpretation post-operatively.  Fluoroscopy Time: Technique:  C-arm fluoroscopic images were obtained intraoperatively and submitted for postoperative interpretation.  Please see the performing provider's procedural report for the fluoroscopy time utilized.  Comparison: Abdominal ultrasound 01/08/2009.  Findings: On the initial run, there is partial opacification of the biliary system.  Access to the cystic duct remnant appears to have been lost during  injection with resulting leakage into the peritoneal cavity.  A subsequent run demonstrates adequate opacification of the common bile duct and no evidence of retained calculus or definite leak.  IMPRESSION:  1.  No evidence of retained calculus or ductal obstruction. 2.  Apparent leakage from the injection site on the initial run, not seen on the subsequent injection.   Original Report Authenticated By: Carey Bullocks, M.D.     Anti-infectives: Anti-infectives   Start     Dose/Rate Route Frequency Ordered Stop   11/20/12 2000  Ampicillin-Sulbactam (UNASYN) 3 g in sodium chloride 0.9 % 100 mL IVPB     3 g 100 mL/hr over 60 Minutes Intravenous Every 6 hours 11/20/12 1852     11/20/12 0945  ceFAZolin (ANCEF) IVPB 2 g/50 mL premix     2 g 100 mL/hr over 30 Minutes Intravenous On call to O.R. 11/20/12 0945 11/20/12 1332      Assessment/Plan: s/p Procedure(s): LAPAROSCOPIC CHOLECYSTECTOMY converted to open cholecystectomy with  CHOLANGIOGRAM (N/A) Drain with less output last night.  seems lighter today as well but could still be bilious.  drain bili still pending.  UOP still borderline but Cr okay.  bolus x1 today and if UOP okay this afternoon will remove foley.  Going down for ERCP today for bile leak but this may be self limiting.  advance diet as tolerated after ERCP per GI.  LOS: 2 days    Paul [name] 11/22/2012

## 2012-11-23 ENCOUNTER — Encounter (HOSPITAL_COMMUNITY): Payer: Self-pay | Admitting: Gastroenterology

## 2012-11-23 LAB — COMPREHENSIVE METABOLIC PANEL
AST: 32 U/L (ref 0–37)
Albumin: 2.5 g/dL — ABNORMAL LOW (ref 3.5–5.2)
Alkaline Phosphatase: 78 U/L (ref 39–117)
BUN: 4 mg/dL — ABNORMAL LOW (ref 6–23)
Chloride: 97 mEq/L (ref 96–112)
Potassium: 3.7 mEq/L (ref 3.5–5.1)
Total Bilirubin: 0.5 mg/dL (ref 0.3–1.2)
Total Protein: 5.5 g/dL — ABNORMAL LOW (ref 6.0–8.3)

## 2012-11-23 LAB — CBC WITH DIFFERENTIAL/PLATELET
Basophils Absolute: 0 10*3/uL (ref 0.0–0.1)
HCT: 26.8 % — ABNORMAL LOW (ref 39.0–52.0)
Lymphocytes Relative: 15 % (ref 12–46)
Lymphs Abs: 1.2 10*3/uL (ref 0.7–4.0)
MCV: 87.9 fL (ref 78.0–100.0)
Neutro Abs: 5.8 10*3/uL (ref 1.7–7.7)
Platelets: 170 10*3/uL (ref 150–400)
RBC: 3.05 MIL/uL — ABNORMAL LOW (ref 4.22–5.81)
RDW: 12.9 % (ref 11.5–15.5)
WBC: 8.3 10*3/uL (ref 4.0–10.5)

## 2012-11-23 NOTE — Progress Notes (Signed)
1 Day Post-Op  Subjective: Still with significant RUQ incisional soreness.  Tolerating clear liquids.  Passing some gas.  Objective: Vital signs in last 24 hours: Temp:  [98.3 F (36.8 C)-100.1 F (37.8 C)] 98.8 F (37.1 C) (05/05 0500) Pulse Rate:  [60-91] 71 (05/05 0500) Resp:  [12-24] 20 (05/05 0757) BP: (102-159)/(65-79) 111/73 mmHg (05/05 0500) SpO2:  [91 %-100 %] 95 % (05/05 0757) Last BM Date: 12/15/12  Intake/Output from previous day: 05/04 0701 - 05/05 0700 In: 2618.8 [I.V.:1618.8; IV Piggyback:1000] Out: 1780 [Urine:1560; Drains:220] Intake/Output this shift: Total I/O In: -  Out: 300 [Urine:300]  PE: General- In NAD Abdomen-Slightly firm and distended, incisions are clean and intact, hypoactive bowel sounds, serous drain output  Lab Results:   Recent Labs  11/22/12 1100 11/23/12 0410  WBC 10.2 8.3  HGB 9.4* 8.9*  HCT 27.4* 26.8*  PLT 174 170   BMET  Recent Labs  11/22/12 0412 11/23/12 0410  NA 132* 133*  K 4.1 3.7  CL 96 97  CO2 31 31  GLUCOSE 133* 119*  BUN 5* 4*  CREATININE 0.87 0.83  CALCIUM 8.6 8.5   PT/INR No results found for this basename: LABPROT, INR,  in the last 72 hours Comprehensive Metabolic Panel:    Component Value Date/Time   NA 133* 11/23/2012 0410   K 3.7 11/23/2012 0410   CL 97 11/23/2012 0410   CO2 31 11/23/2012 0410   BUN 4* 11/23/2012 0410   CREATININE 0.83 11/23/2012 0410   GLUCOSE 119* 11/23/2012 0410   CALCIUM 8.5 11/23/2012 0410   AST 32 11/23/2012 0410   ALT 47 11/23/2012 0410   ALKPHOS 78 11/23/2012 0410   BILITOT 0.5 11/23/2012 0410   PROT 5.5* 11/23/2012 0410   ALBUMIN 2.5* 11/23/2012 0410     Studies/Results: Dg Ercp Biliary & Pancreatic Ducts  11/22/2012  *RADIOLOGY REPORT*  Clinical Data:Common bile duct leak.  ERCP  Technique:  C-arm fluoroscopic images were obtained intraoperatively and submitted for postoperative interpretation. Please see the performing provider's procedural report for the fluoroscopy time utilized.   Comparison: Intraoperative cholangiogram 11/20/2012  Findings: Opacification and cannulization of the common bile duct. Wire was advanced into the intrahepatic ducts.  Placement of a non metallic common bile duct stent.  IMPRESSION: Placement of a biliary stent.   Original Report Authenticated By: Richarda Overlie, M.D.     Anti-infectives: Anti-infectives   Start     Dose/Rate Route Frequency Ordered Stop   11/20/12 2000  Ampicillin-Sulbactam (UNASYN) 3 g in sodium chloride 0.9 % 100 mL IVPB     3 g 100 mL/hr over 60 Minutes Intravenous Every 6 hours 11/20/12 1852     11/20/12 0945  ceFAZolin (ANCEF) IVPB 2 g/50 mL premix     2 g 100 mL/hr over 30 Minutes Intravenous On call to O.R. 11/20/12 0945 11/20/12 1332      Assessment Active Problems: S/p open cholecystectomy 11/20/12-slowly improving; ERCP negative for bile leak. ABL anemia    LOS: 3 days   Plan: Advance to full liquid diet.  Continue abxs and drain.  Recheck CBC tomorrow.   Paul [name] 11/23/2012

## 2012-11-23 NOTE — Progress Notes (Signed)
[name] 9:06 AM  Subjective: Patient doing better than yesterday tolerating clear liquids passing some gas no obvious post ERCP problem Objective: Vital signs stable afebrile no acute distress slightly decreased tenderness drain still with moderate fluid labs stable  Assessment: Status post ERCP stent for bile leak  Plan: We discussed stent removal in 6 weeks and I will check on tomorrow and please let me know if I can be of any further help  [facility] E

## 2012-11-24 LAB — CBC
HCT: 25.7 % — ABNORMAL LOW (ref 39.0–52.0)
Hemoglobin: 8.5 g/dL — ABNORMAL LOW (ref 13.0–17.0)
MCV: 87.1 fL (ref 78.0–100.0)
WBC: 6.2 10*3/uL (ref 4.0–10.5)

## 2012-11-24 LAB — TOTAL BILIRUBIN, BODY FLUID: Total bilirubin, fluid: 30.3 mg/dL

## 2012-11-24 MED ORDER — OXYCODONE HCL 5 MG PO TABS
5.0000 mg | ORAL_TABLET | ORAL | Status: DC | PRN
  Administered 2012-11-24 – 2012-11-26 (×4): 10 mg via ORAL
  Filled 2012-11-24 (×4): qty 2

## 2012-11-24 NOTE — Progress Notes (Signed)
[name] 10:19 AM  Subjective: Patient doing better tolerating regular food and no new complaint  Objective: Vital signs stable afebrile decreased drainage labs stable  Assessment: Status post laparoscopic cholecystectomy and ERCP and stent  Plan: Hopefully patient will be able to go home soon and I'm happy to see back when necessary or in 6 weeks to remove stent which he can setup as an outpatient with my nurse [facility] E

## 2012-11-24 NOTE — Progress Notes (Signed)
2 Days Post-Op  Subjective: Tolerated full liquids well.  Still passing gas.  No BM.  Voiding a lot.  Morphine makes him feel bad.  Objective: Vital signs in last 24 hours: Temp:  [98.3 F (36.8 C)-98.9 F (37.2 C)] 98.9 F (37.2 C) (05/06 0602) Pulse Rate:  [80-94] 80 (05/06 0602) Resp:  [12-20] 18 (05/06 0602) BP: (110-122)/(64-78) 110/64 mmHg (05/06 0602) SpO2:  [91 %-98 %] 97 % (05/06 0602) Last BM Date: 11/15/12  Intake/Output from previous day: 05/05 0701 - 05/06 0700 In: 1896.3 [P.O.:240; I.V.:1456.3; IV Piggyback:200] Out: 4540 [JWJXB:1478; Drains:55] Intake/Output this shift:    PE: General- In NAD Abdomen-Less firm and distended, incisions are clean and intact, more bowel sounds today, serous drain output  Lab Results:   Recent Labs  11/23/12 0410 11/24/12 0413  WBC 8.3 6.2  HGB 8.9* 8.5*  HCT 26.8* 25.7*  PLT 170 185   BMET  Recent Labs  11/22/12 0412 11/23/12 0410  NA 132* 133*  K 4.1 3.7  CL 96 97  CO2 31 31  GLUCOSE 133* 119*  BUN 5* 4*  CREATININE 0.87 0.83  CALCIUM 8.6 8.5   PT/INR No results found for this basename: LABPROT, INR,  in the last 72 hours Comprehensive Metabolic Panel:    Component Value Date/Time   NA 133* 11/23/2012 0410   K 3.7 11/23/2012 0410   CL 97 11/23/2012 0410   CO2 31 11/23/2012 0410   BUN 4* 11/23/2012 0410   CREATININE 0.83 11/23/2012 0410   GLUCOSE 119* 11/23/2012 0410   CALCIUM 8.5 11/23/2012 0410   AST 32 11/23/2012 0410   ALT 47 11/23/2012 0410   ALKPHOS 78 11/23/2012 0410   BILITOT 0.5 11/23/2012 0410   PROT 5.5* 11/23/2012 0410   ALBUMIN 2.5* 11/23/2012 0410     Studies/Results: Dg Ercp Biliary & Pancreatic Ducts  11/22/2012  *RADIOLOGY REPORT*  Clinical Data:Common bile duct leak.  ERCP  Technique:  C-arm fluoroscopic images were obtained intraoperatively and submitted for postoperative interpretation. Please see the performing provider's procedural report for the fluoroscopy time utilized.  Comparison: Intraoperative  cholangiogram 11/20/2012  Findings: Opacification and cannulization of the common bile duct. Wire was advanced into the intrahepatic ducts.  Placement of a non metallic common bile duct stent.  IMPRESSION: Placement of a biliary stent.   Original Report Authenticated By: Richarda Overlie, M.D.     Anti-infectives: Anti-infectives   Start     Dose/Rate Route Frequency Ordered Stop   11/20/12 2000  Ampicillin-Sulbactam (UNASYN) 3 g in sodium chloride 0.9 % 100 mL IVPB     3 g 100 mL/hr over 60 Minutes Intravenous Every 6 hours 11/20/12 1852     11/20/12 0945  ceFAZolin (ANCEF) IVPB 2 g/50 mL premix     2 g 100 mL/hr over 30 Minutes Intravenous On call to O.R. 11/20/12 0945 11/20/12 1332      Assessment Active Problems: S/p open cholecystectomy 11/20/12-continues to improve; ERCP negative for bile leak. ABL anemia-hemoglobin 8.5 today.    LOS: 4 days   Plan: Advance to bland diet.  Decrease IVF.  Stop PCA and start oral analgesic.  Continue abxs and drain.  Recheck CBC tomorrow.   Vern Prestia J 11/24/2012

## 2012-11-25 LAB — CBC
HCT: 25.9 % — ABNORMAL LOW (ref 39.0–52.0)
Hemoglobin: 8.8 g/dL — ABNORMAL LOW (ref 13.0–17.0)
MCH: 29.5 pg (ref 26.0–34.0)
MCHC: 34 g/dL (ref 30.0–36.0)
MCV: 86.9 fL (ref 78.0–100.0)
RBC: 2.98 MIL/uL — ABNORMAL LOW (ref 4.22–5.81)

## 2012-11-25 MED ORDER — OXYCODONE HCL 5 MG PO TABS: 5.0000 mg | ORAL_TABLET | ORAL | Status: DC | PRN

## 2012-11-25 MED ORDER — AMOXICILLIN-POT CLAVULANATE 875-125 MG PO TABS: 1.0000 | ORAL_TABLET | Freq: Two times a day (BID) | ORAL | Status: DC

## 2012-11-25 MED ORDER — SODIUM CHLORIDE 0.9 % IJ SOLN: 3.0000 mL | INTRAMUSCULAR | Status: DC | PRN

## 2012-11-25 MED ORDER — MAGNESIUM HYDROXIDE 400 MG/5ML PO SUSP
30.0000 mL | Freq: Two times a day (BID) | ORAL | Status: DC
  Administered 2012-11-25 – 2012-11-26 (×3): 30 mL via ORAL
  Filled 2012-11-25 (×4): qty 30

## 2012-11-25 MED ORDER — AMOXICILLIN-POT CLAVULANATE 875-125 MG PO TABS
1.0000 | ORAL_TABLET | Freq: Two times a day (BID) | ORAL | Status: DC
  Administered 2012-11-25 – 2012-11-26 (×2): 1 via ORAL
  Filled 2012-11-25 (×4): qty 1

## 2012-11-25 NOTE — Progress Notes (Signed)
3 Days Post-Op  Subjective: Feels weak.  No BM yet.  Tolerating diet.   Objective: Vital signs in last 24 hours: Temp:  [97.9 F (36.6 C)-99.6 F (37.6 C)] 97.9 F (36.6 C) (05/07 0625) Pulse Rate:  [68-86] 68 (05/07 0625) Resp:  [18] 18 (05/07 0625) BP: (106-125)/(65-70) 106/70 mmHg (05/07 0625) SpO2:  [93 %-97 %] 97 % (05/07 0625) Last BM Date: 11/15/12  Intake/Output from previous day: 05/06 0701 - 05/07 0700 In: 1855.4 [P.O.:480; I.V.:1175.4; IV Piggyback:200] Out: 5325 [Urine:5300; Drains:25] Intake/Output this shift:    PE: General- In NAD Abdomen-soft, not distended, incisions are clean and intact, active bowel sounds, minimal serous drain output  Lab Results:   Recent Labs  11/24/12 0413 11/25/12 0410  WBC 6.2 5.4  HGB 8.5* 8.8*  HCT 25.7* 25.9*  PLT 185 250   BMET  Recent Labs  11/23/12 0410  NA 133*  K 3.7  CL 97  CO2 31  GLUCOSE 119*  BUN 4*  CREATININE 0.83  CALCIUM 8.5   PT/INR No results found for this basename: LABPROT, INR,  in the last 72 hours Comprehensive Metabolic Panel:    Component Value Date/Time   NA 133* 11/23/2012 0410   K 3.7 11/23/2012 0410   CL 97 11/23/2012 0410   CO2 31 11/23/2012 0410   BUN 4* 11/23/2012 0410   CREATININE 0.83 11/23/2012 0410   GLUCOSE 119* 11/23/2012 0410   CALCIUM 8.5 11/23/2012 0410   AST 32 11/23/2012 0410   ALT 47 11/23/2012 0410   ALKPHOS 78 11/23/2012 0410   BILITOT 0.5 11/23/2012 0410   PROT 5.5* 11/23/2012 0410   ALBUMIN 2.5* 11/23/2012 0410     Studies/Results: No results found.  Anti-infectives: Anti-infectives   Start     Dose/Rate Route Frequency Ordered Stop   11/20/12 2000  Ampicillin-Sulbactam (UNASYN) 3 g in sodium chloride 0.9 % 100 mL IVPB     3 g 100 mL/hr over 60 Minutes Intravenous Every 6 hours 11/20/12 1852     11/20/12 0945  ceFAZolin (ANCEF) IVPB 2 g/50 mL premix     2 g 100 mL/hr over 30 Minutes Intravenous On call to O.R. 11/20/12 0945 11/20/12 1332      Assessment Active  Problems: S/p open cholecystectomy 11/20/12-somewhat weak and constipated; ERCP negative for bile leak. ABL anemia-hemoglobin 8.8 today.    LOS: 5 days   Plan:  Oral laxative.  Heplock IV.  Hopefully home tomorrow.   Paul [name] 11/25/2012

## 2012-11-25 NOTE — Care Management Note (Signed)
    Page 1 of 1   11/25/2012     10:27:35 AM   CARE MANAGEMENT NOTE 11/25/2012  Patient:  Paul [name],Paul [name]   Account Number:  [id number]  Date Initiated:  11/25/2012  Documentation initiated by:  Lorenda Ishihara  Subjective/Objective Assessment:   42 yo male admitted s/p open chole, s/p ERCP with stent. PTA lived at home with friends.     Action/Plan:   Home when stable   Anticipated DC Date:  11/26/2012   Anticipated DC Plan:  HOME/SELF CARE      DC Planning Services  CM consult      Choice offered to / List presented to:             Status of service:  Completed, signed off Medicare Important Message given?   (If response is "NO", the following Medicare IM given date fields will be blank) Date Medicare IM given:   Date Additional Medicare IM given:    Discharge Disposition:  HOME/SELF CARE  Per UR Regulation:  Reviewed for med. necessity/level of care/duration of stay  If discussed at Long Length of Stay Meetings, dates discussed:    Comments:

## 2012-11-25 NOTE — Progress Notes (Signed)
Paul [name] 2:00 PM  Subjective: Doing better other than a little hoarseness no new complaints  Objective: Vital signs stable afebrile decreased drainage labs are okay  Assessment: improved  Plan: Will be happy to see back when necessary otherwise rediscussed stent removal in approximately 6 weeks  Paul [name]

## 2012-11-26 NOTE — Progress Notes (Signed)
Assessment unchanged. Pt and significant other verbalized understanding of dc instructions. Gauze provided to change dressing at drain site. MD dc'd JP this am on rounds. No drainage noted at present on current gauze applied by MD. Pt understands and verbalized when to call MD post dc if problems occur. Aware of when to follow up with Dr. Abbey Chatters as well as Dr. Ewing Schlein post dc. Discharged via wc to front entrance to meet awaiting vehicle to carry home. Accompanied by NT, girlfriend and mother.

## 2012-11-26 NOTE — Progress Notes (Signed)
4 Days Post-Op  Subjective: Feeling better overall.  Had a BM.    Objective: Vital signs in last 24 hours: Temp:  [98.7 F (37.1 C)-99.2 F (37.3 C)] 99 F (37.2 C) (05/08 0543) Pulse Rate:  [72-76] 72 (05/08 0543) Resp:  [18-20] 18 (05/08 0543) BP: (121-128)/(68-70) 121/68 mmHg (05/08 0543) SpO2:  [96 %-97 %] 96 % (05/08 0543) Last BM Date: 11/15/12  Intake/Output from previous day: 05/07 0701 - 05/08 0700 In: 1438.3 [P.O.:1200; I.V.:238.3] Out: 2240 [Urine:2200; Drains:40] Intake/Output this shift:    PE: General- In NAD Abdomen-soft, not distended, incisions are clean and intact, active bowel sounds, minimal serous drain output-drain removed.  Lab Results:   Recent Labs  11/24/12 0413 11/25/12 0410  WBC 6.2 5.4  HGB 8.5* 8.8*  HCT 25.7* 25.9*  PLT 185 250   BMET No results found for this basename: NA, K, CL, CO2, GLUCOSE, BUN, CREATININE, CALCIUM,  in the last 72 hours PT/INR No results found for this basename: LABPROT, INR,  in the last 72 hours Comprehensive Metabolic Panel:    Component Value Date/Time   NA 133* 11/23/2012 0410   K 3.7 11/23/2012 0410   CL 97 11/23/2012 0410   CO2 31 11/23/2012 0410   BUN 4* 11/23/2012 0410   CREATININE 0.83 11/23/2012 0410   GLUCOSE 119* 11/23/2012 0410   CALCIUM 8.5 11/23/2012 0410   AST 32 11/23/2012 0410   ALT 47 11/23/2012 0410   ALKPHOS 78 11/23/2012 0410   BILITOT 0.5 11/23/2012 0410   PROT 5.5* 11/23/2012 0410   ALBUMIN 2.5* 11/23/2012 0410     Studies/Results: No results found.  Anti-infectives: Anti-infectives   Start     Dose/Rate Route Frequency Ordered Stop   11/25/12 2200  amoxicillin-clavulanate (AUGMENTIN) 875-125 MG per tablet 1 tablet     1 tablet Oral Every 12 hours 11/25/12 1555     11/25/12 0000  amoxicillin-clavulanate (AUGMENTIN) 875-125 MG per tablet     1 tablet Oral 2 times daily 11/25/12 0837     11/20/12 2000  Ampicillin-Sulbactam (UNASYN) 3 g in sodium chloride 0.9 % 100 mL IVPB  Status:  Discontinued      3 g 100 mL/hr over 60 Minutes Intravenous Every 6 hours 11/20/12 1852 11/25/12 1555   11/20/12 0945  ceFAZolin (ANCEF) IVPB 2 g/50 mL premix     2 g 100 mL/hr over 30 Minutes Intravenous On call to O.R. 11/20/12 0945 11/20/12 1332      Assessment Active Problems: S/p open cholecystectomy 11/20/12-feeling better overall; ERCP negative for bile leak, stent in place. ABL anemia-hemoglobin 8.8 yesterday.    LOS: 6 days   Plan:  Remove staples.  Discharge home.  Instructions given.  Will need to have stent removed by Dr. Ewing Schlein in 5-6 weeks.   Paul [name] J 11/26/2012

## 2012-12-04 NOTE — Discharge Summary (Signed)
Physician Discharge Summary  Patient ID: Paul [name] MRN: [id number] DOB/AGE: 04-05-1971 42 y.o.  Admit date: 11/20/2012 Discharge date: 11/26/2012  Admission Diagnoses:  Symptomatic cholelithiasis  Discharge Diagnoses: Acute and chronic cholecystitis    Discharged Condition: good  Hospital Course: He underwent a laparoscopic converted to open cholecystectomy 11/20/2012. Postoperatively he remained on antibiotics as acute inflammatory changes were noted intraoperatively. He also had a drain in. The first postoperative day there was suspicion of bilious drainage. He was seen by gastroenterology and on the second postoperative day he underwent an ERCP. No obvious bile leak was noted. Given the previous suspicion, a stent was left in. His drainage decreased significantly after this. He had a mild postoperative ileus which slowly improved. His diet was slowly advanced. His activity was slowly increased. He did have some acute blood loss anemia but did not require a transfusion.  By his sixth postoperative day he was eating, his bowels were moving, his hemoglobin was stable at 8.8, his drain was able to be removed, and he was able to be discharged. He was given discharge instructions. He'll need to see Dr. Ewing Schlein out in 4-6 weeks to discuss stent removal. He'll follow with me in the office in 2-3 weeks.  Consults: GI  Significant Diagnostic Studies: ERCP  Treatments: surgery: Laparoscopic converted to open cholecystectomy with cholangiogram 11/21/2038  Discharge Exam: Blood pressure 121/68, pulse 72, temperature 99 F (37.2 C), temperature source Oral, resp. rate 18, height 5\' 10"  (1.778 m), weight 189 lb 8 oz (85.957 kg), SpO2 96.00%.   Disposition: 01-Home or Self Care   Future Appointments Provider Department Dept Phone   12/16/2012 9:00 AM Adolph Pollack, MD [facility], [place] [phone]       Medication List    STOP taking these medications       HYDROcodone-acetaminophen 5-325 MG per tablet  Commonly known as:  NORCO/VICODIN     oxyCODONE-acetaminophen 5-325 MG per tablet  Commonly known as:  PERCOCET/ROXICET      TAKE these medications       acetaminophen 500 MG tablet  Commonly known as:  TYLENOL  Take 1,000 mg by mouth every 6 (six) hours as needed for pain.     amoxicillin-clavulanate 875-125 MG per tablet  Commonly known as:  AUGMENTIN  Take 1 tablet by mouth 2 (two) times daily.     ibuprofen 200 MG tablet  Commonly known as:  ADVIL,MOTRIN  Take 400-800 mg by mouth every 6 (six) hours as needed for pain.     oxyCODONE 5 MG immediate release tablet  Commonly known as:  Oxy IR/ROXICODONE  Take 1-2 tablets (5-10 mg total) by mouth every 4 (four) hours as needed.     zolpidem 10 MG tablet  Commonly known as:  AMBIEN  Take 5-10 mg by mouth at bedtime as needed for sleep.         Signed: Adolph Pollack 12/04/2012, 10:32 AM

## 2012-12-16 ENCOUNTER — Ambulatory Visit (INDEPENDENT_AMBULATORY_CARE_PROVIDER_SITE_OTHER): Payer: 59 | Admitting: General Surgery

## 2012-12-16 ENCOUNTER — Encounter (INDEPENDENT_AMBULATORY_CARE_PROVIDER_SITE_OTHER): Payer: Self-pay | Admitting: General Surgery

## 2012-12-16 VITALS — BP 112/68 | HR 74 | Resp 14 | Ht 70.0 in | Wt 196.8 lb

## 2012-12-16 DIAGNOSIS — Z9889 Other specified postprocedural states: Secondary | ICD-10-CM

## 2012-12-16 NOTE — Progress Notes (Signed)
Procedure:  Open cholecystectomy with cholangiogram  Date:  11/20/2012  Pathology:  Acute cholecystitis  History:  He is here for his first postoperative visit and is slowly getting better. He is doing some work from home. He is driving. He is tolerating a bland diet. He still has a stent in from his ERCP.  Exam: General- Is in NAD. Abdomen-soft, all incisions are clean, intact, and solid.  Assessment:  Making slow but steady progress postoperatively.  Plan:  May expand diet some and we discussed that. Continue activity restrictions. May go to the office and do some desk work. We'll need to set up an appointment with Dr. Ewing Schlein to arrange to have the stent removed.  Return visit 3 weeks.

## 2012-12-16 NOTE — Patient Instructions (Signed)
May expand diet as we discussed. May try desk work at the office. Continue activity restrictions.

## 2012-12-31 ENCOUNTER — Other Ambulatory Visit: Payer: Self-pay | Admitting: Gastroenterology

## 2012-12-31 NOTE — Addendum Note (Signed)
Addended by: Lollie Gunner on: 12/31/2012 10:00 AM   Modules accepted: Orders  

## 2013-01-01 ENCOUNTER — Ambulatory Visit (HOSPITAL_COMMUNITY)
Admission: RE | Admit: 2013-01-01 | Discharge: 2013-01-01 | Disposition: A | Discharge status: Home / Self Care | Payer: 59 | Source: Ambulatory Visit | Attending: Gastroenterology | Admitting: Gastroenterology

## 2013-01-01 ENCOUNTER — Encounter (HOSPITAL_COMMUNITY): Payer: Self-pay | Admitting: *Deleted

## 2013-01-01 ENCOUNTER — Encounter (INDEPENDENT_AMBULATORY_CARE_PROVIDER_SITE_OTHER): Payer: Self-pay | Admitting: General Surgery

## 2013-01-01 ENCOUNTER — Encounter (HOSPITAL_COMMUNITY)
Admission: RE | Disposition: A | Discharge status: Home / Self Care | Payer: Self-pay | Source: Ambulatory Visit | Attending: Gastroenterology

## 2013-01-01 ENCOUNTER — Ambulatory Visit (HOSPITAL_COMMUNITY): Admission: RE | Admit: 2013-01-01 | Payer: 59 | Source: Ambulatory Visit | Admitting: Gastroenterology

## 2013-01-01 ENCOUNTER — Ambulatory Visit (INDEPENDENT_AMBULATORY_CARE_PROVIDER_SITE_OTHER): Payer: 59 | Admitting: General Surgery

## 2013-01-01 ENCOUNTER — Encounter (HOSPITAL_COMMUNITY): Admission: RE | Payer: Self-pay | Source: Ambulatory Visit

## 2013-01-01 VITALS — BP 124/82 | HR 68 | Temp 97.7°F | Resp 15 | Ht 70.0 in | Wt 199.2 lb

## 2013-01-01 DIAGNOSIS — Z9889 Other specified postprocedural states: Secondary | ICD-10-CM

## 2013-01-01 DIAGNOSIS — Z4659 Encounter for fitting and adjustment of other gastrointestinal appliance and device: Secondary | ICD-10-CM | POA: Insufficient documentation

## 2013-01-01 HISTORY — PX: ESOPHAGOGASTRODUODENOSCOPY: SHX5428

## 2013-01-01 SURGERY — EGD (ESOPHAGOGASTRODUODENOSCOPY)
Anesthesia: Moderate Sedation

## 2013-01-01 SURGERY — ERCP, WITH INTERVENTION IF INDICATED
Anesthesia: Moderate Sedation

## 2013-01-01 MED ORDER — FENTANYL CITRATE 0.05 MG/ML IJ SOLN
INTRAMUSCULAR | Status: DC | PRN
  Administered 2013-01-01 (×2): 25 ug via INTRAVENOUS

## 2013-01-01 MED ORDER — MIDAZOLAM HCL 10 MG/2ML IJ SOLN
INTRAMUSCULAR | Status: DC | PRN
  Administered 2013-01-01 (×5): 2 mg via INTRAVENOUS

## 2013-01-01 MED ORDER — SODIUM CHLORIDE 0.9 % IV SOLN
INTRAVENOUS | Status: DC
  Administered 2013-01-01: 11:00:00 via INTRAVENOUS

## 2013-01-01 MED ORDER — BUTAMBEN-TETRACAINE-BENZOCAINE 2-2-14 % EX AERO
INHALATION_SPRAY | CUTANEOUS | Status: DC | PRN
  Administered 2013-01-01: 1 via TOPICAL

## 2013-01-01 MED ORDER — GLUCAGON HCL (RDNA) 1 MG IJ SOLR
INTRAMUSCULAR | Status: AC
  Filled 2013-01-01: qty 1

## 2013-01-01 MED ORDER — MIDAZOLAM HCL 5 MG/ML IJ SOLN
INTRAMUSCULAR | Status: AC
  Filled 2013-01-01: qty 4

## 2013-01-01 MED ORDER — DIPHENHYDRAMINE HCL 50 MG/ML IJ SOLN
INTRAMUSCULAR | Status: DC | PRN
  Administered 2013-01-01: 25 mg via INTRAVENOUS

## 2013-01-01 MED ORDER — DIPHENHYDRAMINE HCL 50 MG/ML IJ SOLN
INTRAMUSCULAR | Status: AC
  Filled 2013-01-01: qty 1

## 2013-01-01 MED ORDER — FENTANYL CITRATE 0.05 MG/ML IJ SOLN
INTRAMUSCULAR | Status: AC
  Filled 2013-01-01: qty 8

## 2013-01-01 NOTE — Progress Notes (Signed)
Procedure:  Open cholecystectomy with cholangiogram  Date:  11/20/2012  Pathology:  Acute cholecystitis  History:  He is here for his second postoperative visit and is significantly better.  He is scheduled to have his stent removed later today.  He has minimal incisional discomfort.  Exam: General- Is in NAD. Abdomen-soft, all incisions are clean, intact, and solid.  Assessment:  Doing much better.  Plan:  Activities as tolerated. Low-fat diet recommended. Return visit as needed.

## 2013-01-01 NOTE — Op Note (Signed)
[name] [address] [place], [address]   ENDOSCOPY PROCEDURE REPORT  PATIENT: Paul [name], Paul [name]  MR#: [id number] BIRTHDATE: [birth year] , 42  yrs. old GENDER: Male  ENDOSCOPIST: Vida Rigger, MD REFERRED XB:JYNW Abbey Chatters, M.D.  PROCEDURE DATE:  01/01/2013 PROCEDURE:   EGD w/ fb removal ASA CLASS:   Class I INDICATIONS:bile leak.  MEDICATIONS: Fentanyl 50 mcg IV and Versed 10 mg IV   25 mg Benadryl  TOPICAL ANESTHETIC:used  DESCRIPTION OF PROCEDURE:   After the risks benefits and alternatives of the procedure were thoroughly explained, informed consent was obtained.  The Pentax Gastroscope [id number]  endoscope was introduced through the mouth and advanced to the second portion of the duodenum , limited by Without limitations. other than an abnormal J-shaped stomach no abnormalities were seen  The instrument was slowly withdrawn as the mucosa was fully examined.and the stent was seen in the ampulla and snared in the customary fashion and the standard snare and scope were removed and the stent was recovered and the procedure was terminated and the patient tolerated the procedure well         FINDINGS:1. J-shaped stomach otherwise normal EGD 2. Status post stent removal using the snare in the customary fashion  COMPLICATIONS: none  ENDOSCOPIC IMPRESSION: above   RECOMMENDATIONS:happy to see back when necessary otherwise: Screening at age 82   REPEAT EXAM: as needed   _______________________________ Vida Rigger, MD eSigned:  Vida Rigger, MD 01/01/2013 11:25 AM    GN:FAOZ Abbey Chatters, MD

## 2013-01-01 NOTE — Patient Instructions (Signed)
Resume normal activities as tolerated, as discussed. Low-fat diet.

## 2013-04-19 ENCOUNTER — Encounter (HOSPITAL_COMMUNITY): Payer: Self-pay

## 2013-04-19 ENCOUNTER — Inpatient Hospital Stay (HOSPITAL_COMMUNITY)
Admission: EM | Admit: 2013-04-19 | Discharge: 2013-04-21 | DRG: 557 | Disposition: A | Discharge status: Home / Self Care | Payer: BC Managed Care – PPO | Attending: Internal Medicine | Admitting: Internal Medicine

## 2013-04-19 ENCOUNTER — Emergency Department (HOSPITAL_COMMUNITY): Payer: BC Managed Care – PPO

## 2013-04-19 ENCOUNTER — Inpatient Hospital Stay (HOSPITAL_COMMUNITY): Payer: BC Managed Care – PPO

## 2013-04-19 DIAGNOSIS — K810 Acute cholecystitis: Secondary | ICD-10-CM

## 2013-04-19 DIAGNOSIS — J189 Pneumonia, unspecified organism: Secondary | ICD-10-CM

## 2013-04-19 DIAGNOSIS — Z79899 Other long term (current) drug therapy: Secondary | ICD-10-CM

## 2013-04-19 DIAGNOSIS — K8051 Calculus of bile duct without cholangitis or cholecystitis with obstruction: Principal | ICD-10-CM | POA: Diagnosis present

## 2013-04-19 DIAGNOSIS — Z791 Long term (current) use of non-steroidal anti-inflammatories (NSAID): Secondary | ICD-10-CM

## 2013-04-19 DIAGNOSIS — K8050 Calculus of bile duct without cholangitis or cholecystitis without obstruction: Secondary | ICD-10-CM | POA: Diagnosis present

## 2013-04-19 DIAGNOSIS — E860 Dehydration: Secondary | ICD-10-CM

## 2013-04-19 DIAGNOSIS — G4700 Insomnia, unspecified: Secondary | ICD-10-CM | POA: Diagnosis present

## 2013-04-19 LAB — PROTIME-INR: Prothrombin Time: 13.1 seconds (ref 11.6–15.2)

## 2013-04-19 LAB — CBC
HCT: 38.5 % — ABNORMAL LOW (ref 39.0–52.0)
Hemoglobin: 13.5 g/dL (ref 13.0–17.0)
MCH: 30.3 pg (ref 26.0–34.0)
MCHC: 35.1 g/dL (ref 30.0–36.0)
MCV: 86.3 fL (ref 78.0–100.0)
Platelets: 168 10*3/uL (ref 150–400)
RBC: 4.46 MIL/uL (ref 4.22–5.81)
RDW: 12.7 % (ref 11.5–15.5)

## 2013-04-19 LAB — COMPREHENSIVE METABOLIC PANEL
ALT: 36 U/L (ref 0–53)
AST: 34 U/L (ref 0–37)
CO2: 27 mEq/L (ref 19–32)
Chloride: 101 mEq/L (ref 96–112)
Creatinine, Ser: 1.02 mg/dL (ref 0.50–1.35)
GFR calc Af Amer: >90 mL/min (ref >90)
GFR calc non Af Amer: 90 mL/min — ABNORMAL LOW (ref >90)
Glucose, Bld: 127 mg/dL — ABNORMAL HIGH (ref 70–99)
Sodium: 139 mEq/L (ref 135–145)
Total Bilirubin: 0.4 mg/dL (ref 0.3–1.2)

## 2013-04-19 LAB — CBC WITH DIFFERENTIAL/PLATELET
Basophils Absolute: 0 10*3/uL (ref 0.0–0.1)
HCT: 39.6 % (ref 39.0–52.0)
Lymphocytes Relative: 23 % (ref 12–46)
Lymphs Abs: 1.7 10*3/uL (ref 0.7–4.0)
MCV: 86.3 fL (ref 78.0–100.0)
Monocytes Absolute: 1.1 10*3/uL — ABNORMAL HIGH (ref 0.1–1.0)
Neutro Abs: 4.5 10*3/uL (ref 1.7–7.7)
RBC: 4.59 MIL/uL (ref 4.22–5.81)
RDW: 12.8 % (ref 11.5–15.5)
WBC: 7.4 10*3/uL (ref 4.0–10.5)

## 2013-04-19 LAB — URINALYSIS, ROUTINE W REFLEX MICROSCOPIC
Bilirubin Urine: NEGATIVE
Glucose, UA: NEGATIVE mg/dL
Hgb urine dipstick: NEGATIVE
Ketones, ur: NEGATIVE mg/dL
Protein, ur: NEGATIVE mg/dL
Urobilinogen, UA: 1 mg/dL (ref 0.0–1.0)

## 2013-04-19 LAB — CREATININE, SERUM
Creatinine, Ser: 0.92 mg/dL (ref 0.50–1.35)
GFR calc non Af Amer: >90 mL/min (ref >90)

## 2013-04-19 LAB — MAGNESIUM: Magnesium: 2.2 mg/dL (ref 1.5–2.5)

## 2013-04-19 LAB — APTT: aPTT: 25 seconds (ref 24–37)

## 2013-04-19 MED ORDER — PIPERACILLIN-TAZOBACTAM 3.375 G IVPB
3.3750 g | Freq: Three times a day (TID) | INTRAVENOUS | Status: DC
  Administered 2013-04-19 – 2013-04-21 (×6): 3.375 g via INTRAVENOUS
  Filled 2013-04-19 (×7): qty 50

## 2013-04-19 MED ORDER — ENOXAPARIN SODIUM 40 MG/0.4ML ~~LOC~~ SOLN
40.0000 mg | SUBCUTANEOUS | Status: DC
  Administered 2013-04-19 – 2013-04-20 (×2): 40 mg via SUBCUTANEOUS
  Filled 2013-04-19 (×3): qty 0.4

## 2013-04-19 MED ORDER — PIPERACILLIN-TAZOBACTAM 3.375 G IVPB 30 MIN
3.3750 g | Freq: Once | INTRAVENOUS | Status: AC
  Administered 2013-04-19: 3.375 g via INTRAVENOUS
  Filled 2013-04-19 (×2): qty 50

## 2013-04-19 MED ORDER — ONDANSETRON HCL 4 MG PO TABS: 4.0000 mg | ORAL_TABLET | Freq: Four times a day (QID) | ORAL | Status: DC | PRN

## 2013-04-19 MED ORDER — DOCUSATE SODIUM 100 MG PO CAPS
100.0000 mg | ORAL_CAPSULE | Freq: Two times a day (BID) | ORAL | Status: DC
  Administered 2013-04-19 – 2013-04-21 (×4): 100 mg via ORAL
  Filled 2013-04-19 (×5): qty 1

## 2013-04-19 MED ORDER — SORBITOL 70 % SOLN: 30.0000 mL | Freq: Every day | Status: DC | PRN

## 2013-04-19 MED ORDER — IOHEXOL 300 MG/ML  SOLN
100.0000 mL | Freq: Once | INTRAMUSCULAR | Status: AC | PRN
  Administered 2013-04-19: 100 mL via INTRAVENOUS

## 2013-04-19 MED ORDER — PANTOPRAZOLE SODIUM 40 MG PO TBEC
40.0000 mg | DELAYED_RELEASE_TABLET | Freq: Every day | ORAL | Status: DC
  Administered 2013-04-20: 40 mg via ORAL
  Filled 2013-04-19 (×3): qty 1

## 2013-04-19 MED ORDER — ONDANSETRON HCL 4 MG/2ML IJ SOLN
4.0000 mg | Freq: Once | INTRAMUSCULAR | Status: AC
  Administered 2013-04-19: 4 mg via INTRAVENOUS
  Filled 2013-04-19: qty 2

## 2013-04-19 MED ORDER — ZOLPIDEM TARTRATE 5 MG PO TABS: 5.0000 mg | ORAL_TABLET | Freq: Every evening | ORAL | Status: DC | PRN

## 2013-04-19 MED ORDER — LEVOFLOXACIN IN D5W 750 MG/150ML IV SOLN
750.0000 mg | Freq: Once | INTRAVENOUS | Status: DC
  Filled 2013-04-19: qty 150

## 2013-04-19 MED ORDER — ACETAMINOPHEN 325 MG PO TABS
650.0000 mg | ORAL_TABLET | Freq: Four times a day (QID) | ORAL | Status: DC | PRN
  Administered 2013-04-20 (×2): 650 mg via ORAL
  Filled 2013-04-19 (×2): qty 2

## 2013-04-19 MED ORDER — IOHEXOL 300 MG/ML  SOLN
50.0000 mL | Freq: Once | INTRAMUSCULAR | Status: AC | PRN
  Administered 2013-04-19: 50 mL via ORAL

## 2013-04-19 MED ORDER — MAGNESIUM CITRATE PO SOLN: 1.0000 | Freq: Once | ORAL | Status: AC | PRN

## 2013-04-19 MED ORDER — POLYETHYLENE GLYCOL 3350 17 G PO PACK
17.0000 g | PACK | Freq: Every day | ORAL | Status: DC | PRN
  Filled 2013-04-19: qty 1

## 2013-04-19 MED ORDER — SODIUM CHLORIDE 0.9 % IV BOLUS (SEPSIS)
1000.0000 mL | Freq: Once | INTRAVENOUS | Status: AC
  Administered 2013-04-19: 1000 mL via INTRAVENOUS

## 2013-04-19 MED ORDER — FENTANYL CITRATE 0.05 MG/ML IJ SOLN
100.0000 ug | Freq: Once | INTRAMUSCULAR | Status: AC
  Administered 2013-04-19: 100 ug via INTRAVENOUS
  Filled 2013-04-19: qty 2

## 2013-04-19 MED ORDER — ALUM & MAG HYDROXIDE-SIMETH 200-200-20 MG/5ML PO SUSP
30.0000 mL | Freq: Four times a day (QID) | ORAL | Status: DC | PRN
Start: 2013-04-19 — End: 2013-04-21

## 2013-04-19 MED ORDER — ACETAMINOPHEN 650 MG RE SUPP: 650.0000 mg | Freq: Four times a day (QID) | RECTAL | Status: DC | PRN

## 2013-04-19 MED ORDER — ALBUTEROL SULFATE (5 MG/ML) 0.5% IN NEBU: 2.5000 mg | INHALATION_SOLUTION | RESPIRATORY_TRACT | Status: DC | PRN

## 2013-04-19 MED ORDER — ONDANSETRON HCL 4 MG/2ML IJ SOLN
4.0000 mg | Freq: Four times a day (QID) | INTRAMUSCULAR | Status: DC | PRN
  Administered 2013-04-20: 4 mg via INTRAVENOUS
  Filled 2013-04-19 (×2): qty 2

## 2013-04-19 MED ORDER — OXYCODONE HCL 5 MG PO TABS
5.0000 mg | ORAL_TABLET | ORAL | Status: DC | PRN
  Administered 2013-04-19 – 2013-04-21 (×6): 5 mg via ORAL
  Filled 2013-04-19 (×6): qty 1

## 2013-04-19 MED ORDER — SODIUM CHLORIDE 0.9 % IV SOLN
INTRAVENOUS | Status: DC
  Administered 2013-04-19 – 2013-04-21 (×5): via INTRAVENOUS

## 2013-04-19 MED ORDER — MORPHINE SULFATE 2 MG/ML IJ SOLN
2.0000 mg | INTRAMUSCULAR | Status: DC | PRN
  Administered 2013-04-19: 4 mg via INTRAVENOUS
  Administered 2013-04-19: 2 mg via INTRAVENOUS
  Administered 2013-04-20: 4 mg via INTRAVENOUS
  Administered 2013-04-20: 2 mg via INTRAVENOUS
  Filled 2013-04-19: qty 2
  Filled 2013-04-19: qty 1
  Filled 2013-04-19 (×2): qty 2

## 2013-04-19 NOTE — Consult Note (Signed)
Referring Provider: Ramiro Harvest, MD ([facility]) Primary Care Physician:  Londell Moh, MD Primary Gastroenterologist:  Dr. Vida Rigger  Reason for Consultation:  Apparent common bile duct stone on CT scan  HPI: Paul [name] is a 42 y.o. male who is 4 months status post a laparoscopic cholecystectomy complicated by a bile leak, prompting ERCP, sphincterotomy, and stent placement by Dr. Vida Rigger in May of this year, with stent retrieval in June of this year.   He presented to the emergency room today with excruciating right upper quadrant pain with radiation to the back, reminiscent of his previous gallbladder pain, which forced him to leave work.  In the emergency room, although liver chemistries and lipase were normal, the patient had a CT of the abdomen which showed a 5 mm density in the distal common bile duct consistent with a stone, along with mild to moderate biliary ductal dilatation. His pain has improved on morphine.  The patient indicates that he had been having nonspecific, low-grade upper abdominal pain for the past week, not sufficient to make him miss work or to interfere with his sleep, and not clearly related to meals. There was no problem with any associated nausea and vomiting, nor any fevers.  From the time of his cholecystectomy up until the present time, his only GI symptom had been a tendency for postprandial diarrhea, possibly, but not clearly, related to fatty meals.  His CT showed an incidental finding of some possible consolidation in the left lower lobe, and the patient indicates he has had a nonproductive cough in the past couple of days. Note that he is afebrile and without an elevated white count, however.   Past Medical History  Diagnosis Date  . Abdominal pain   . Cholelithiasis   . Insomnia     Past Surgical History  Procedure Laterality Date  . Hernia repair      Childhood./ inguinal  . Ercp N/A 11/22/2012    Procedure: ENDOSCOPIC  RETROGRADE CHOLANGIOPANCREATOGRAPHY (ERCP);  Surgeon: Petra Kuba, MD;  Location: [facility] ORS;  Service: Endoscopy;  Laterality: N/A;  . Cholecystectomy N/A 11/20/2012    Procedure: LAPAROSCOPIC CHOLECYSTECTOMY converted to open cholecystectomy with  CHOLANGIOGRAM;  Surgeon: Adolph Pollack, MD;  Location: [facility] ORS;  Service: General;  Laterality: N/A;  . Wisdom tooth extraction    . Esophagogastroduodenoscopy N/A 01/01/2013    Procedure: ESOPHAGOGASTRODUODENOSCOPY (EGD);  Surgeon: Petra Kuba, MD;  Location: [facility] ENDOSCOPY;  Service: Endoscopy;  Laterality: N/A;    Prior to Admission medications   Medication Sig Start Date End Date Taking? Authorizing Provider  acetaminophen (TYLENOL) 500 MG tablet Take 1,000 mg by mouth every 6 (six) hours as needed for pain.   Yes Historical Provider, MD  ibuprofen (ADVIL,MOTRIN) 200 MG tablet Take 400-800 mg by mouth every 6 (six) hours as needed for pain.   Yes Historical Provider, MD  zolpidem (AMBIEN) 10 MG tablet Take 5-10 mg by mouth at bedtime as needed for sleep.    Yes Historical Provider, MD    Current Facility-Administered Medications  Medication Dose Route Frequency Provider Last Rate Last Dose  . 0.9 %  sodium chloride infusion   Intravenous Continuous Rodolph Bong, MD 125 mL/hr at 04/19/13 1608    . acetaminophen (TYLENOL) tablet 650 mg  650 mg Oral Q6H PRN Rodolph Bong, MD       Or  . acetaminophen (TYLENOL) suppository 650 mg  650 mg Rectal Q6H PRN Rodolph Bong, MD      .  albuterol (PROVENTIL) (5 MG/ML) 0.5% nebulizer solution 2.5 mg  2.5 mg Nebulization Q2H PRN Rodolph Bong, MD      . alum & mag hydroxide-simeth (MAALOX/MYLANTA) 200-200-20 MG/5ML suspension 30 mL  30 mL Oral Q6H PRN Rodolph Bong, MD      . docusate sodium (COLACE) capsule 100 mg  100 mg Oral BID Rodolph Bong, MD      . enoxaparin (LOVENOX) injection 40 mg  40 mg Subcutaneous Q24H Rodolph Bong, MD      . magnesium citrate solution 1 Bottle  1  Bottle Oral Once PRN Rodolph Bong, MD      . morphine 2 MG/ML injection 2-4 mg  2-4 mg Intravenous Q4H PRN Rodolph Bong, MD   4 mg at 04/19/13 2010  . ondansetron (ZOFRAN) tablet 4 mg  4 mg Oral Q6H PRN Rodolph Bong, MD       Or  . ondansetron [facility]) injection 4 mg  4 mg Intravenous Q6H PRN Rodolph Bong, MD      . oxyCODONE (Oxy IR/ROXICODONE) immediate release tablet 5 mg  5 mg Oral Q4H PRN Rodolph Bong, MD      . Melene Muller ON 04/20/2013] pantoprazole (PROTONIX) EC tablet 40 mg  40 mg Oral Q0600 Rodolph Bong, MD      . piperacillin-tazobactam (ZOSYN) IVPB 3.375 g  3.375 g Intravenous Q8H Amanda M Runyon, RPH      . polyethylene glycol (MIRALAX / GLYCOLAX) packet 17 g  17 g Oral Daily PRN Rodolph Bong, MD      . sorbitol 70 % solution 30 mL  30 mL Oral Daily PRN Rodolph Bong, MD      . zolpidem [facility]) tablet 5-10 mg  5-10 mg Oral QHS PRN Rodolph Bong, MD        Allergies as of 04/19/2013 - Review Complete 04/19/2013  Allergen Reaction Noted  . Dilaudid [hydromorphone hcl] Anaphylaxis 10/16/2012  . Lunesta [eszopiclone] Other (See Comments) 10/16/2012    Family History  Problem Relation Age of Onset  . Cancer Father     lung    History   Social History  . Marital Status: Widowed    Spouse Name: N/A    Number of Children: N/A  . Years of Education: N/A   Occupational History  . Not on file.   Social History Main Topics  . Smoking status: Never Smoker   . Smokeless tobacco: Never Used  . Alcohol Use: Yes     Comment: 1-2 drinks/week  . Drug Use: No  . Sexual Activity: No   Other Topics Concern  . Not on file   Social History Narrative  . No narrative on file    Review of Systems: See history of present illness  Physical Exam: Vital signs in last 24 hours: Temp:  [98.2 F (36.8 C)-99 F (37.2 C)] 99 F (37.2 C) (09/29 1438) Pulse Rate:  [60-70] 70 (09/29 1438) Resp:  [16-22] 18 (09/29 1438) BP: (107-148)/(68-85)  126/80 mmHg (09/29 1438) SpO2:  [97 %-100 %] 99 % (09/29 1438) Weight:  [90.266 kg (199 lb)] 90.266 kg (199 lb) (09/29 1438) Last BM Date: 04/19/13 General:   Alert,  Well-developed, well-nourished, pleasant and cooperative in NAD, minimally somulent following morphine administration earlier Head:  Normocephalic and atraumatic. Eyes:  Sclera clear, no icterus.   Conjunctiva pink. Mouth:   No ulcerations or lesions.  Oropharynx pink & moist. Lungs:  Clear throughout to  auscultation.   No wheezes, crackles, or rhonchi. No evident respiratory distress. In particular, I do not appreciate any rales in the left lower lobe region to correlate with the findings on his CT scan. Heart:   Regular rate and rhythm; no murmurs, clicks, rubs,  or gallops. Abdomen:  Soft, nontender, nontympanitic, and nondistended. No masses, hepatosplenomegaly or ventral hernias noted. Normal bowel sounds, without bruits, guarding, or rebound.  Specifically, no right upper quadrant tenderness present at this time. Msk:   Symmetrical without gross deformities. Extremities:   Without clubbing, cyanosis, or edema. Neurologic:  Alert and coherent;  grossly normal neurologically. Skin:  Intact without significant lesions or rashes. Psych:   Alert and cooperative. Normal mood and affect.  Intake/Output from previous day:   Intake/Output this shift:    Lab Results:  Recent Labs  04/19/13 1025 04/19/13 1605  WBC 7.4 8.3  HGB 14.0 13.5  HCT 39.6 38.5*  PLT 168 168   BMET  Recent Labs  04/19/13 1025 04/19/13 1605  NA 139  --   K 4.0  --   CL 101  --   CO2 27  --   GLUCOSE 127*  --   BUN 12  --   CREATININE 1.02 0.92  CALCIUM 9.7  --    LFT  Recent Labs  04/19/13 1025  PROT 7.7  ALBUMIN 4.6  AST 34  ALT 36  ALKPHOS 91  BILITOT 0.4   PT/INR  Recent Labs  04/19/13 1605  LABPROT 13.1  INR 1.01     Studies/Results: Dg Chest 2 View  04/19/2013   CLINICAL DATA:  Cough.  Left lower lobe  infiltrate.  EXAM: CHEST  2 VIEW  COMPARISON:  CT scan of the abdomen dated 04/19/2013  FINDINGS: There is a focal consolidative infiltrate and atelectasis in the posterior aspect of the left lower lobe which correlates with the abnormality seen on CT scan.  The heart size and pulmonary vascularity are normal and the lungs are otherwise clear. No osseous abnormality.  IMPRESSION: Left lower lobe infiltrate.   Electronically Signed   By: Geanie Cooley   On: 04/19/2013 17:59   Ct Abdomen Pelvis W Contrast  04/19/2013   CLINICAL DATA:  Upper abdominal pain, nausea and vomiting  EXAM: CT ABDOMEN AND PELVIS WITH CONTRAST  TECHNIQUE: Multidetector CT imaging of the abdomen and pelvis was performed using the standard protocol following bolus administration of intravenous contrast.  CONTRAST:  OMNIPAQUE IOHEXOL 300 MG/ML  SOLN  COMPARISON:  None.  FINDINGS: There is confluent left lower lobe superior segmental airspace consolidation with air bronchogram formation.  Mild central intrahepatic ductal prominence noted as well as tortuosity of the mildly dilated 7 mm common duct and a 5 mm hyperdense filling defect just proximal to the ampulla within the intrapancreatic portion of the common duct image 37. 3 mm too small to characterize hypodense lesion at the dome of the right hepatic lobe image 13. Several 3 and 4 mm too small to characterize hypodense lesions are noted in the right renal cortex. No hydronephrosis or enhancing mass. No radiopaque renal or ureteral calculus. Left kidney, adrenal glands, spleen, and pancreas otherwise unremarkable. No free air, free fluid, or lymphadenopathy.  The bowel is normal. Appendix is normal. Bladder is normal. No pelvic free fluid or lymphadenopathy.  No acute osseous abnormality.  IMPRESSION: 5 mm common duct stone within the intrapancreatic portion of the common duct, with mild intrahepatic ductal dilatation and common duct dilation to 7  mm. This could represent symptomatic  choledocholithiasis.  Left lower lobe superior segmental consolidation with an appearance suggestive of pneumonia or aspiration.  These results were called by telephone at the time of interpretation on 04/19/2013 at 12:42 PM to Dr. Jaci Carrel , who verbally acknowledged these results.   Electronically Signed   By: Christiana Pellant M.D.   On: 04/19/2013 12:42    Impression: 1. Upper abdominal pain reminiscent of her previous gallbladder pain, status post cholecystectomy 4 months ago 2. Filling defect in the common bile duct suggestive of a common duct stone 3. Radiographic evidence of pneumonia without leukocytosis, productive cough, or fever  Discussion: It is unusual for symptomatic cholelithiasis not to be associated with a bump in liver chemistries, but at the moment, I don't have any other good alternative explanation for his clinical presentation and his radiographic findings. At the moment, I tend to doubt that his infiltrate in the left lung on chest x-ray is related to his acute clinical presentation and severe right upper quadrant abdominal pain  Plan: 1. Recheck liver chemistries tomorrow 2. Tentatively, we are planning to proceed with ERCP and possible stone extraction on Wednesday. The case has been discussed with his gastroenterologist, Dr. Ewing Schlein, and the patient is aware that he will be the one performing the procedure. 3. I would favor preprocedural assessment by anesthesiology to make sure that they have no misgivings about providing anesthesia in this patient in the setting of a pulmonary infiltrate.  Florencia Reasons, M.D. [phone]     LOS: 0 days   Florencia Reasons  04/19/2013, 9:14 PM

## 2013-04-19 NOTE — H&P (Signed)
[facility] History and Physical  Paul [name] JXB:[id number] DOB: 1971/06/09 DOA: 04/19/2013  Referring physician: Dr Blinda Leatherwood PCP: Londell Moh, MD  Specialists: GI: Dr Ewing Schlein  Chief Complaint: abdominal pain  HPI: Paul [name] is a 42 y.o. male  History of acute ascites status post open cholecystectomy with cholangiogram 11/20/2012, status post ERCP with sphincterotomy/papillotomy ERCP with stent placement 11/22/2012 per Dr. Ewing Schlein, and insomnia who presents to the ED with a one-week history of worsening right upper quadrant abdominal pain. Patient stated that right upper quadrant abdominal pain worsened on the morning of admission pain was excruciating and 2 he subsequently presented to the ED. Patient endorses some subjective fevers. Patient also endorses some congestion and a cough that has been ongoing for the past 3-4 days. Patient denies any nausea, no vomiting, no chills, no chest pain, no diarrhea, no constipation, no melena, no hematemesis, no hematochezia, no diarrhea. Patient stated that he had some weakness had not felt good and some shortness of breath with this pain. Patient with no other associated symptoms. Patient was seen in the ED comprehensive metabolic profile which was done was unremarkable. CBC which was done was negative. UA negative. CT abd/pelvis  Was done which showed a 5 mm common duct stone within intrapancreatic portion of the common duct with mild intrahepatic ductal dilatation and common duct dilatation to 7 mm. This could represent a symptomatic choledocholithiasis. Left lower lobe superior segmental consolidation with an appearance suggestive of pneumonia or aspiration. We will consulted to admit the patient for further evaluation and management. Patient was started on IV Zosyn in the ED. The ED physician he had spoke with Dr. Matthias Hughs of [facility] who will consult on the patient.   Review of Systems: The patient denies anorexia, fever,  weight loss,, vision loss, decreased hearing, hoarseness, chest pain, syncope, dyspnea on exertion, peripheral edema, balance deficits, hemoptysis, abdominal pain, melena, hematochezia, severe indigestion/heartburn, hematuria, incontinence, genital sores, muscle weakness, suspicious skin lesions, transient blindness, difficulty walking, depression, unusual weight change, abnormal bleeding, enlarged lymph nodes, angioedema, and breast masses.   Past Medical History  Diagnosis Date  . Abdominal pain   . Cholelithiasis   . Insomnia    Past Surgical History  Procedure Laterality Date  . Hernia repair      Childhood./ inguinal  . Ercp N/A 11/22/2012    Procedure: ENDOSCOPIC RETROGRADE CHOLANGIOPANCREATOGRAPHY (ERCP);  Surgeon: Petra Kuba, MD;  Location: [facility];  Service: Endoscopy;  Laterality: N/A;  . Cholecystectomy N/A 11/20/2012    Procedure: LAPAROSCOPIC CHOLECYSTECTOMY converted to open cholecystectomy with  CHOLANGIOGRAM;  Surgeon: Adolph Pollack, MD;  Location: [facility];  Service: General;  Laterality: N/A;  . Wisdom tooth extraction    . Esophagogastroduodenoscopy N/A 01/01/2013    Procedure: ESOPHAGOGASTRODUODENOSCOPY (EGD);  Surgeon: Petra Kuba, MD;  [facility] ENDOSCOPY;  Service: Endoscopy;  Laterality: N/A;   Social History:  reports that he has never smoked. He has never used smokeless tobacco. He reports that  drinks alcohol. He reports that he does not use illicit drugs.  Allergies  Allergen Reactions  . Dilaudid [Hydromorphone Hcl] Anaphylaxis  . Lunesta [Eszopiclone] Other (See Comments)    Bad taste    Family History  Problem Relation Age of Onset  . Cancer Father     lung     Prior to Admission medications   Medication Sig Start Date End Date Taking? Authorizing Provider  acetaminophen (TYLENOL) 500 MG tablet Take 1,000 mg by mouth every 6 (  six) hours as needed for pain.   Yes Historical Provider, MD  ibuprofen (ADVIL,MOTRIN) 200 MG tablet Take 400-800 mg by  mouth every 6 (six) hours as needed for pain.   Yes Historical Provider, MD  zolpidem (AMBIEN) 10 MG tablet Take 5-10 mg by mouth at bedtime as needed for sleep.    Yes Historical Provider, MD   Physical Exam: Filed Vitals:   04/19/13 1438  BP: 126/80  Pulse: 70  Temp: 99 F (37.2 C)  Resp: 18     General:  WDWN in NAD  Eyes: PERRLA, EOMI  ENT: Oropharynx clear, no lesions no exudates. Dry mucous membranes.  Neck: Supple with no lymphadenopathy. No JVD.  Cardiovascular: Regular rate rhythm no murmurs rubs or gallops.  Respiratory: Clear to auscultation bilaterally no wheezes, no crackles, no rhonchi.  Abdomen: Soft, nondistended, positive bowel sounds, some tenderness to palpation in the right upper quadrant. Scar noted in the right upper quadrant.  Skin: No rashes or lesions. Cholecystectomy scar noted.  Musculoskeletal: 5/5 BUE strength, 5/5 BLE strength  Psychiatric: Normal mood. Normal affect. Good insight. Good judgment.  Neurologic: Alert and oriented x3. Cranial nerves II through XII are grossly intact. No focal deficits. Sensation is intact. Visual fields are intact. Gait not tested secondary to safety.  Labs on Admission:  Basic Metabolic Panel:  Recent Labs Lab 04/19/13 1025  NA 139  K 4.0  CL 101  CO2 27  GLUCOSE 127*  BUN 12  CREATININE 1.02  CALCIUM 9.7   Liver Function Tests:  Recent Labs Lab 04/19/13 1025  AST 34  ALT 36  ALKPHOS 91  BILITOT 0.4  PROT 7.7  ALBUMIN 4.6    Recent Labs Lab 04/19/13 1025  LIPASE 35   No results found for this basename: AMMONIA,  in the last 168 hours CBC:  Recent Labs Lab 04/19/13 1025  WBC 7.4  NEUTROABS 4.5  HGB 14.0  HCT 39.6  MCV 86.3  PLT 168   Cardiac Enzymes: No results found for this basename: CKTOTAL, CKMB, CKMBINDEX, TROPONINI,  in the last 168 hours  BNP (last 3 results) No results found for this basename: PROBNP,  in the last 8760 hours CBG: No results found for this  basename: GLUCAP,  in the last 168 hours  Radiological Exams on Admission: Ct Abdomen Pelvis W Contrast  04/19/2013   CLINICAL DATA:  Upper abdominal pain, nausea and vomiting  EXAM: CT ABDOMEN AND PELVIS WITH CONTRAST  TECHNIQUE: Multidetector CT imaging of the abdomen and pelvis was performed using the standard protocol following bolus administration of intravenous contrast.  CONTRAST:  OMNIPAQUE IOHEXOL 300 MG/ML  SOLN  COMPARISON:  None.  FINDINGS: There is confluent left lower lobe superior segmental airspace consolidation with air bronchogram formation.  Mild central intrahepatic ductal prominence noted as well as tortuosity of the mildly dilated 7 mm common duct and a 5 mm hyperdense filling defect just proximal to the ampulla within the intrapancreatic portion of the common duct image 37. 3 mm too small to characterize hypodense lesion at the dome of the right hepatic lobe image 13. Several 3 and 4 mm too small to characterize hypodense lesions are noted in the right renal cortex. No hydronephrosis or enhancing mass. No radiopaque renal or ureteral calculus. Left kidney, adrenal glands, spleen, and pancreas otherwise unremarkable. No free air, free fluid, or lymphadenopathy.  The bowel is normal. Appendix is normal. Bladder is normal. No pelvic free fluid or lymphadenopathy.  No acute osseous  abnormality.  IMPRESSION: 5 mm common duct stone within the intrapancreatic portion of the common duct, with mild intrahepatic ductal dilatation and common duct dilation to 7 mm. This could represent symptomatic choledocholithiasis.  Left lower lobe superior segmental consolidation with an appearance suggestive of pneumonia or aspiration.  These results were called by telephone at the time of interpretation on 04/19/2013 at 12:42 PM to Dr. Jaci Carrel , who verbally acknowledged these results.   Electronically Signed   By: Christiana Pellant M.D.   On: 04/19/2013 12:42    EKG: Independently reviewed.  Normal sinus rhythm  Assessment/Plan Principal Problem:   Choledocholithiasis Active Problems:   CAP (community acquired pneumonia)   Dehydration  #1 choledocholithiasis Patient is status post cholecystectomy May of 2014. Patient present with abdominal pain right-sided. CT of the abdomen and pelvis consistent with choledocholithiasis. Patient is currently afebrile. Will place patient on clear liquids. IV fluids. Pain management. Empiric IV Zosyn. GI has been consulted and will be seeing the patient. Patient will likely need an ERCP. Follow.  #2 community acquired pneumonia Per CT scan of the abdomen and pelvis. Patient had a three-day history of cough. Will treat empirically with IV Zosyn. Follow.  #3 dehydration IV fluids.  #4 prophylaxis PPI for GI prophylaxis. Lovenox for DVT prophylaxis.  Code Status: Full Family Communication: updated patient and fiancee at bedside Disposition Plan: Admit to med surg.  Time spent: 65 mins  [facility] Pager [phone]  If 7PM-7AM, please contact night-coverage [email or web address] Password [facility] 04/19/2013, 3:24 PM

## 2013-04-19 NOTE — Progress Notes (Signed)
ANTIBIOTIC CONSULT NOTE - INITIAL  Pharmacy Consult for Zosyn Indication: pneumonia  Allergies  Allergen Reactions  . Dilaudid [Hydromorphone Hcl] Anaphylaxis  . Lunesta [Eszopiclone] Other (See Comments)    Bad taste    Patient Measurements: Height: 5\' 11"  (180.3 cm) Weight: 199 lb (90.266 kg) IBW/kg (Calculated) : 75.3   Vital Signs: Temp: 99 F (37.2 C) (09/29 1438) Temp src: Oral (09/29 1438) BP: 126/80 mmHg (09/29 1438) Pulse Rate: 70 (09/29 1438) Intake/Output from previous day:   Intake/Output from this shift: Total I/O In: -  Out: 450 [Urine:450]  Labs:  Recent Labs  04/19/13 1025  WBC 7.4  HGB 14.0  PLT 168  CREATININE 1.02   Estimated Creatinine Clearance: 101.5 ml/min (by C-G formula based on Cr of 1.02). No results found for this basename: VANCOTROUGH, VANCOPEAK, VANCORANDOM, GENTTROUGH, GENTPEAK, GENTRANDOM, TOBRATROUGH, TOBRAPEAK, TOBRARND, AMIKACINPEAK, AMIKACINTROU, AMIKACIN,  in the last 72 hours   Microbiology: No results found for this or any previous visit (from the past 720 hour(s)).  Medical History: Past Medical History  Diagnosis Date  . Abdominal pain   . Cholelithiasis   . Insomnia     Assessment: 2 yoM presents with choledocholithiasis and pneumonia to be admitted for likely ERCP tomorrow.  CT abdomen also showed left lower lobe superior segmental consolidation with an appearance suggestive of pneumonia or aspiration.  Pharmacy consulted for dosing of Zosyn.  SCr 1.02 , CrCl>100 ml/min  WBC 7.4, Tmax 99  Blood cultures ordered.  Goal of Therapy:  Eradication of infection  Plan:  Zosyn 3.375g IV q8h (4 hour infusion time).   [name] 04/19/2013,3:41 PM

## 2013-04-19 NOTE — ED Notes (Signed)
Pollina, MD at bedside 

## 2013-04-19 NOTE — ED Notes (Signed)
Bed: WA01 Expected date:  Expected time:  Means of arrival:  Comments: 

## 2013-04-19 NOTE — ED Notes (Signed)
Pt c/o RUQ abdominal pain and R side rib cage pain x 3 days.  Pain score 9/10.  Sts diarrhea x 2 days ago and resolved.

## 2013-04-19 NOTE — Progress Notes (Signed)
Attempted to call and receive report from ED RN. Left number with secretary for RN to call back.

## 2013-04-19 NOTE — ED Provider Notes (Signed)
CSN: [id number]     Arrival date & time 04/19/13  2440 History   First MD Initiated Contact with Patient 04/19/13 1105     Chief Complaint  Patient presents with  . Abdominal Pain  . Rib cage pain    (Consider location/radiation/quality/duration/timing/severity/associated sxs/prior Treatment) HPI Comments: Patient presents to the ER for evaluation of abdominal pain. Patient reports that he had an episode of upper abdominal pain approximately 10 days ago that resolved. He started having pain again in 3 or 4 days ago and it has been persistent and progressively worsening. He was in the upper abdomen more on the right side. Patient reports sharp, stabbing and crampy pain. He has had nausea but no vomiting. He did have diarrhea 2 days ago but that has resolved.  Patient is a 42 y.o. male presenting with abdominal pain.  Abdominal Pain Associated symptoms: cough and diarrhea   Associated symptoms: no fever     Past Medical History  Diagnosis Date  . Abdominal pain   . Cholelithiasis   . Insomnia    Past Surgical History  Procedure Laterality Date  . Hernia repair      Childhood./ inguinal  . Ercp N/A 11/22/2012    Procedure: ENDOSCOPIC RETROGRADE CHOLANGIOPANCREATOGRAPHY (ERCP);  Surgeon: Petra Kuba, MD;  Location: [facility];  Service: Endoscopy;  Laterality: N/A;  . Cholecystectomy N/A 11/20/2012    Procedure: LAPAROSCOPIC CHOLECYSTECTOMY converted to open cholecystectomy with  CHOLANGIOGRAM;  Surgeon: Adolph Pollack, MD;  Location: [facility];  Service: General;  Laterality: N/A;  . Wisdom tooth extraction    . Esophagogastroduodenoscopy N/A 01/01/2013    Procedure: ESOPHAGOGASTRODUODENOSCOPY (EGD);  Surgeon: Petra Kuba, MD;  Location: [facility] ENDOSCOPY;  Service: Endoscopy;  Laterality: N/A;   Family History  Problem Relation Age of Onset  . Cancer Father     lung   History  Substance Use Topics  . Smoking status: Never Smoker   . Smokeless tobacco: Never Used  . Alcohol Use: Yes      Comment: 1-2 drinks/week    Review of Systems  Constitutional: Negative for fever.  Respiratory: Positive for cough.   Gastrointestinal: Positive for abdominal pain and diarrhea.  All other systems reviewed and are negative.    Allergies  Dilaudid and Lunesta  Home Medications   Current Outpatient Rx  Name  Route  Sig  Dispense  Refill  . acetaminophen (TYLENOL) 500 MG tablet   Oral   Take 1,000 mg by mouth every 6 (six) hours as needed for pain.         Marland Kitchen ibuprofen (ADVIL,MOTRIN) 200 MG tablet   Oral   Take 400-800 mg by mouth every 6 (six) hours as needed for pain.         Marland Kitchen zolpidem (AMBIEN) 10 MG tablet   Oral   Take 5-10 mg by mouth at bedtime as needed for sleep.           BP 148/85  Pulse 65  Resp 22  SpO2 100% Physical Exam  Constitutional: He is oriented to person, place, and time. He appears well-developed and well-nourished. No distress.  HENT:  Head: Normocephalic and atraumatic.  Right Ear: Hearing normal.  Left Ear: Hearing normal.  Nose: Nose normal.  Mouth/Throat: Oropharynx is clear and moist and mucous membranes are normal.  Eyes: Conjunctivae and EOM are normal. Pupils are equal, round, and reactive to light.  Neck: Normal range of motion. Neck supple.  Cardiovascular: Regular rhythm, S1 normal and  S2 normal.  Exam reveals no gallop and no friction rub.   No murmur heard. Pulmonary/Chest: Effort normal and breath sounds normal. No respiratory distress. He exhibits no tenderness.  Abdominal: Soft. Normal appearance and bowel sounds are normal. There is no hepatosplenomegaly. There is generalized tenderness. There is no rebound, no guarding, no tenderness at McBurney's point and negative Murphy's sign. No hernia.  Musculoskeletal: Normal range of motion.  Neurological: He is alert and oriented to person, place, and time. He has normal strength. No cranial nerve deficit or sensory deficit. Coordination normal. GCS eye subscore is 4. GCS verbal  subscore is 5. GCS motor subscore is 6.  Skin: Skin is warm, dry and intact. No rash noted. No cyanosis.  Psychiatric: He has a normal mood and affect. His speech is normal and behavior is normal. Thought content normal.    ED Course  Procedures (including critical care time) Labs Review Labs Reviewed  CBC WITH DIFFERENTIAL - Abnormal; Notable for the following:    Monocytes Relative 15 (*)    Monocytes Absolute 1.1 (*)    All other components within normal limits  COMPREHENSIVE METABOLIC PANEL - Abnormal; Notable for the following:    Glucose, Bld 127 (*)    GFR calc non Af Amer 90 (*)    All other components within normal limits  CULTURE, BLOOD (ROUTINE X 2)  CULTURE, BLOOD (ROUTINE X 2)  LIPASE, BLOOD  URINALYSIS, ROUTINE W REFLEX MICROSCOPIC  POCT I-STAT TROPONIN I   Imaging Review Ct Abdomen Pelvis W Contrast  04/19/2013   CLINICAL DATA:  Upper abdominal pain, nausea and vomiting  EXAM: CT ABDOMEN AND PELVIS WITH CONTRAST  TECHNIQUE: Multidetector CT imaging of the abdomen and pelvis was performed using the standard protocol following bolus administration of intravenous contrast.  CONTRAST:  OMNIPAQUE IOHEXOL 300 MG/ML  SOLN  COMPARISON:  None.  FINDINGS: There is confluent left lower lobe superior segmental airspace consolidation with air bronchogram formation.  Mild central intrahepatic ductal prominence noted as well as tortuosity of the mildly dilated 7 mm common duct and a 5 mm hyperdense filling defect just proximal to the ampulla within the intrapancreatic portion of the common duct image 37. 3 mm too small to characterize hypodense lesion at the dome of the right hepatic lobe image 13. Several 3 and 4 mm too small to characterize hypodense lesions are noted in the right renal cortex. No hydronephrosis or enhancing mass. No radiopaque renal or ureteral calculus. Left kidney, adrenal glands, spleen, and pancreas otherwise unremarkable. No free air, free fluid, or  lymphadenopathy.  The bowel is normal. Appendix is normal. Bladder is normal. No pelvic free fluid or lymphadenopathy.  No acute osseous abnormality.  IMPRESSION: 5 mm common duct stone within the intrapancreatic portion of the common duct, with mild intrahepatic ductal dilatation and common duct dilation to 7 mm. This could represent symptomatic choledocholithiasis.  Left lower lobe superior segmental consolidation with an appearance suggestive of pneumonia or aspiration.  These results were called by telephone at the time of interpretation on 04/19/2013 at 12:42 PM to Dr. Jaci Carrel , who verbally acknowledged these results.   Electronically Signed   By: Christiana Pellant M.D.   On: 04/19/2013 12:42    MDM  Diagnosis: 1. Choledocholithiasis 2. Community-acquired pneumonia  Patient presents with abdominal pain. Patient reports severe upper abdominal pain, more right sided, for several days. He reports that he had an initial episode of the pain about 10 days ago but resolved.  The last several days the pain has been persistent and progressively worsened. Patient reports that felt similar to when he had his gall bladder issues. Patient had cholecystectomy followed by ERCP and common bile duct stenting in June of this year.  Patient was unremarkable. He did have improvement of his pain with fentanyl. Patient underwent CT scan for further evaluation and it does show evidence of left-sided pneumonia. Further discussion with patient reveals that he has had a cough over the weekend, but hadn't thought much about it and upon initial evaluation. Additionally, this does seem to have a 5 mm common duct stone.  Case discussed with Doctor Buccini. He recommended admission to the hospitalist service, will consult. Likely ERCP tomorrow.    Gilda Crease, MD 04/19/13 1331

## 2013-04-19 NOTE — ED Notes (Signed)
Pt states he is currently unable to urinate at this time.

## 2013-04-20 LAB — CBC
HCT: 35.1 % — ABNORMAL LOW (ref 39.0–52.0)
MCHC: 35.3 g/dL (ref 30.0–36.0)
MCV: 86.5 fL (ref 78.0–100.0)
Platelets: 149 10*3/uL — ABNORMAL LOW (ref 150–400)
RBC: 4.06 MIL/uL — ABNORMAL LOW (ref 4.22–5.81)
RDW: 12.8 % (ref 11.5–15.5)
WBC: 6.7 10*3/uL (ref 4.0–10.5)

## 2013-04-20 LAB — COMPREHENSIVE METABOLIC PANEL
ALT: 494 U/L — ABNORMAL HIGH (ref 0–53)
Albumin: 3.9 g/dL (ref 3.5–5.2)
Alkaline Phosphatase: 176 U/L — ABNORMAL HIGH (ref 39–117)
BUN: 9 mg/dL (ref 6–23)
CO2: 26 mEq/L (ref 19–32)
Calcium: 9.1 mg/dL (ref 8.4–10.5)
Chloride: 100 mEq/L (ref 96–112)
Creatinine, Ser: 1.07 mg/dL (ref 0.50–1.35)
GFR calc Af Amer: >90 mL/min (ref >90)
GFR calc non Af Amer: 85 mL/min — ABNORMAL LOW (ref >90)
Glucose, Bld: 113 mg/dL — ABNORMAL HIGH (ref 70–99)
Potassium: 4 mEq/L (ref 3.5–5.1)
Sodium: 137 mEq/L (ref 135–145)
Total Bilirubin: 2 mg/dL — ABNORMAL HIGH (ref 0.3–1.2)
Total Protein: 6.5 g/dL (ref 6.0–8.3)

## 2013-04-20 LAB — PROTIME-INR: INR: 1.03 (ref 0.00–1.49)

## 2013-04-20 MED ORDER — MORPHINE SULFATE 2 MG/ML IJ SOLN
2.0000 mg | INTRAMUSCULAR | Status: DC | PRN
  Administered 2013-04-20 – 2013-04-21 (×3): 4 mg via INTRAVENOUS
  Filled 2013-04-20 (×3): qty 2

## 2013-04-20 MED ORDER — PROMETHAZINE HCL 25 MG/ML IJ SOLN: 25.0000 mg | INTRAMUSCULAR | Status: DC | PRN

## 2013-04-20 MED ORDER — ONDANSETRON HCL 4 MG PO TABS: 4.0000 mg | ORAL_TABLET | ORAL | Status: DC | PRN

## 2013-04-20 MED ORDER — MORPHINE SULFATE 4 MG/ML IJ SOLN
INTRAMUSCULAR | Status: AC
  Administered 2013-04-20: 4 mg
  Filled 2013-04-20: qty 1

## 2013-04-20 MED ORDER — ONDANSETRON HCL 4 MG/2ML IJ SOLN
4.0000 mg | INTRAMUSCULAR | Status: DC | PRN
  Administered 2013-04-20: 4 mg via INTRAVENOUS

## 2013-04-20 NOTE — Care Management Note (Signed)
   CARE MANAGEMENT NOTE 04/20/2013  Patient:  Altadonna,Jaeson L   Account Number:  [id number]  Date Initiated:  04/20/2013  Documentation initiated by:  Ryden Wainer  Subjective/Objective Assessment:   42 yo male admitted with choledocholithiasis. PCP: Londell Moh, MD .     Action/Plan:   Home when stable   Anticipated DC Date:     Anticipated DC Plan:  HOME/SELF CARE      DC Planning Services  CM consult      Choice offered to / List presented to:  NA   DME arranged  NA      DME agency  NA     HH arranged  NA      HH agency  NA   Status of service:  In process, will continue to follow Medicare Important Message given?   (If response is "NO", the following Medicare IM given date fields will be blank) Date Medicare IM given:   Date Additional Medicare IM given:    Discharge Disposition:    Per UR Regulation:  Reviewed for med. necessity/level of care/duration of stay  If discussed at Long Length of Stay Meetings, dates discussed:    Comments:  04/20/13 1023 Koltan Portocarrero,RN,MSN [phone] Chart reviewed for utilization of services. Scheduled surgical intervention 04/21/13. Will continue to monitor for [facility] needs.

## 2013-04-20 NOTE — Progress Notes (Signed)
[name] 12:59 PM  Subjective: Patient is actually doing better today than yesterday and wants something to eat and has no new complaints  Objective: Vital signs stable afebrile no acute distress abdomen is soft nontender white count okay liver tests increased today  Assessment: CBD stone  Plan: We rediscussed ERCP and increased sphincterotomy and attempts at removing the stone including the risks with he and his mom and will proceed tomorrow morning with further workup and plans pending those findings  [facility] E

## 2013-04-20 NOTE — Progress Notes (Signed)
[facility] PROGRESS NOTE  Paul [name] ZOX:[id number] DOB: Dec 10, 1970 DOA: 04/19/2013 PCP: Londell Moh, MD  Brief narrative: Paul [name] is an 42 y.o. male who is 4 months status post a laparoscopic cholecystectomy complicated by a bile leak, prompting ERCP, sphincterotomy, and stent placement by Dr. Vida Rigger in May of this year, with stent retrieval in June of this year who presented to the ED 04/19/2013 with excruciating right upper quadrant pain with radiation to the back, reminiscent of his previous gallbladder pain.  Evaluation in the ED showed normal liver chemistries and lipase but CT of the abdomen showed a 5 mm density in the distal common bile duct consistent with stone.  Assessment/Plan: Principal Problem:   Choledocholithiasis -Seen by Dr. Matthias Hughs  on 04/19/2013 with tentative plans to proceed with an ERCP and possible stone extraction on 04/21/2013. -Significant bump in liver chemistries noted, consistent with common bile duct obstruction. Active Problems:   CAP (community acquired pneumonia) -Put on empiric Zosyn.   -CT scan and plain radiographs personally reviewed. Left lower lobe infiltrate apparent. Had a cough a few days prior to admission but otherwise no clinically significant pneumonia. When discharged, consider a few days of azithromycin, but may not need any further antibiotics.   Dehydration -Continue IV fluids.  Code Status: Full. Family Communication: Mother updated at the bedside. Disposition Plan: Home when stable.   Medical Consultants:  Dr. Bernette Redbird, Gastroenterology  Other Consultants:  None.  Anti-infectives:  Levaquin 04/19/2013---> 04/19/2013  Zosyn 04/19/2013 --->  HPI/Subjective: Paul [name] continues to have some abdominal soreness and pain. No nausea or vomiting. Tolerating clear liquids. No diarrhea. Low-grade fever noted.  Objective: Filed Vitals:   04/19/13 1417 04/19/13 1438 04/19/13 2213 04/20/13 0530  BP:  107/68 126/80 125/70 103/67  Pulse: 60 70 52 55  Temp: 98.2 F (36.8 C) 99 F (37.2 C) 99.9 F (37.7 C) 99 F (37.2 C)  TempSrc: Oral Oral Oral Oral  Resp: 16 18 16 18   Height:  5\' 11"  (1.803 m)    Weight:  90.266 kg (199 lb)  92.987 kg (205 lb)  SpO2: 97% 99% 96% 97%    Intake/Output Summary (Last 24 hours) at 04/20/13 0719 Last data filed at 04/20/13 8119  Gross per 24 hour  Intake 2160.83 ml  Output    450 ml  Net 1710.83 ml    Exam: Gen:  NAD Cardiovascular:  RRR, No M/R/G Respiratory:  Lungs CTAB Gastrointestinal:  Abdomen soft, tender, + BS Extremities:  No C/E/C  Data Reviewed: Basic Metabolic Panel:  Recent Labs Lab 04/19/13 1025 04/19/13 1605 04/20/13 0345  NA 139  --  137  K 4.0  --  4.0  CL 101  --  100  CO2 27  --  26  GLUCOSE 127*  --  113*  BUN 12  --  9  CREATININE 1.02 0.92 1.07  CALCIUM 9.7  --  9.1  MG  --  2.2  --    GFR Estimated Creatinine Clearance: 105.9 ml/min (by C-G formula based on Cr of 1.07). Liver Function Tests:  Recent Labs Lab 04/19/13 1025 04/20/13 0345  AST 34 428*  ALT 36 494*  ALKPHOS 91 176*  BILITOT 0.4 2.0*  PROT 7.7 6.5  ALBUMIN 4.6 3.9    Recent Labs Lab 04/19/13 1025  LIPASE 35   Coagulation profile  Recent Labs Lab 04/19/13 1605 04/20/13 0345  INR 1.01 1.03    CBC:  Recent Labs Lab 04/19/13 1025  04/19/13 1605 04/20/13 0345  WBC 7.4 8.3 6.7  NEUTROABS 4.5  --   --   HGB 14.0 13.5 12.4*  HCT 39.6 38.5* 35.1*  MCV 86.3 86.3 86.5  PLT 168 168 149*   Microbiology No results found for this or any previous visit (from the past 240 hour(s)).   Procedures and Diagnostic Studies: Dg Chest 2 View  04/19/2013   CLINICAL DATA:  Cough.  Left lower lobe infiltrate.  EXAM: CHEST  2 VIEW  COMPARISON:  CT scan of the abdomen dated 04/19/2013  FINDINGS: There is a focal consolidative infiltrate and atelectasis in the posterior aspect of the left lower lobe which correlates with the abnormality  seen on CT scan.  The heart size and pulmonary vascularity are normal and the lungs are otherwise clear. No osseous abnormality.  IMPRESSION: Left lower lobe infiltrate.   Electronically Signed   By: Geanie Cooley   On: 04/19/2013 17:59   Ct Abdomen Pelvis W Contrast  04/19/2013   CLINICAL DATA:  Upper abdominal pain, nausea and vomiting  EXAM: CT ABDOMEN AND PELVIS WITH CONTRAST  TECHNIQUE: Multidetector CT imaging of the abdomen and pelvis was performed using the standard protocol following bolus administration of intravenous contrast.  CONTRAST:  OMNIPAQUE IOHEXOL 300 MG/ML  SOLN  COMPARISON:  None.  FINDINGS: There is confluent left lower lobe superior segmental airspace consolidation with air bronchogram formation.  Mild central intrahepatic ductal prominence noted as well as tortuosity of the mildly dilated 7 mm common duct and a 5 mm hyperdense filling defect just proximal to the ampulla within the intrapancreatic portion of the common duct image 37. 3 mm too small to characterize hypodense lesion at the dome of the right hepatic lobe image 13. Several 3 and 4 mm too small to characterize hypodense lesions are noted in the right renal cortex. No hydronephrosis or enhancing mass. No radiopaque renal or ureteral calculus. Left kidney, adrenal glands, spleen, and pancreas otherwise unremarkable. No free air, free fluid, or lymphadenopathy.  The bowel is normal. Appendix is normal. Bladder is normal. No pelvic free fluid or lymphadenopathy.  No acute osseous abnormality.  IMPRESSION: 5 mm common duct stone within the intrapancreatic portion of the common duct, with mild intrahepatic ductal dilatation and common duct dilation to 7 mm. This could represent symptomatic choledocholithiasis.  Left lower lobe superior segmental consolidation with an appearance suggestive of pneumonia or aspiration.  These results were called by telephone at the time of interpretation on 04/19/2013 at 12:42 PM to Dr. Jaci Carrel , who verbally acknowledged these results.   Electronically Signed   By: Christiana Pellant M.D.   On: 04/19/2013 12:42    Scheduled Meds: . docusate sodium  100 mg Oral BID  . enoxaparin (LOVENOX) injection  40 mg Subcutaneous Q24H  . pantoprazole  40 mg Oral Q0600  . piperacillin-tazobactam (ZOSYN)  IV  3.375 g Intravenous Q8H   Continuous Infusions: . sodium chloride 125 mL/hr at 04/19/13 2151    Time spent: 35 minutes with > 50% of time discussing current diagnostic test results, clinical impression and plan of care.    LOS: 1 day   RAMA,CHRISTINA  [facility] Pager [phone].   *Please note that the hospitalists switch teams on Wednesdays. Please call the flow manager at [phone] if you are having difficulty reaching the hospitalist taking care of this patient as she can update you and provide the most up-to-date pager number of provider caring for the patient. If  8PM-8AM, please contact night-coverage at [email or web address], password [facility]  04/20/2013, 7:19 AM

## 2013-04-21 ENCOUNTER — Encounter (HOSPITAL_COMMUNITY)
Admission: EM | Disposition: A | Discharge status: Home / Self Care | Payer: Self-pay | Source: Home / Self Care | Attending: Internal Medicine

## 2013-04-21 ENCOUNTER — Encounter (HOSPITAL_COMMUNITY): Payer: Self-pay | Admitting: Anesthesiology

## 2013-04-21 ENCOUNTER — Inpatient Hospital Stay (HOSPITAL_COMMUNITY): Payer: BC Managed Care – PPO | Admitting: Anesthesiology

## 2013-04-21 ENCOUNTER — Encounter (HOSPITAL_COMMUNITY): Payer: Self-pay | Admitting: *Deleted

## 2013-04-21 ENCOUNTER — Inpatient Hospital Stay (HOSPITAL_COMMUNITY): Payer: BC Managed Care – PPO

## 2013-04-21 DIAGNOSIS — K8050 Calculus of bile duct without cholangitis or cholecystitis without obstruction: Secondary | ICD-10-CM

## 2013-04-21 DIAGNOSIS — J189 Pneumonia, unspecified organism: Secondary | ICD-10-CM

## 2013-04-21 DIAGNOSIS — E860 Dehydration: Secondary | ICD-10-CM

## 2013-04-21 HISTORY — PX: ERCP: SHX5425

## 2013-04-21 LAB — COMPREHENSIVE METABOLIC PANEL
ALT: 449 U/L — ABNORMAL HIGH (ref 0–53)
AST: 208 U/L — ABNORMAL HIGH (ref 0–37)
Albumin: 4 g/dL (ref 3.5–5.2)
CO2: 29 mEq/L (ref 19–32)
Calcium: 9.2 mg/dL (ref 8.4–10.5)
Creatinine, Ser: 1.04 mg/dL (ref 0.50–1.35)
GFR calc non Af Amer: 88 mL/min — ABNORMAL LOW (ref >90)
Total Protein: 6.8 g/dL (ref 6.0–8.3)

## 2013-04-21 LAB — TYPE AND SCREEN
ABO/RH(D): A NEG
Antibody Screen: NEGATIVE

## 2013-04-21 LAB — CBC
MCH: 30.4 pg (ref 26.0–34.0)
MCV: 87.7 fL (ref 78.0–100.0)
Platelets: 157 10*3/uL (ref 150–400)
RDW: 13.1 % (ref 11.5–15.5)

## 2013-04-21 SURGERY — ERCP, WITH INTERVENTION IF INDICATED
Anesthesia: General

## 2013-04-21 MED ORDER — PROPOFOL 10 MG/ML IV BOLUS
INTRAVENOUS | Status: DC | PRN
  Administered 2013-04-21: 150 mg via INTRAVENOUS

## 2013-04-21 MED ORDER — METOCLOPRAMIDE HCL 5 MG/ML IJ SOLN
INTRAMUSCULAR | Status: DC | PRN
  Administered 2013-04-21: 10 mg via INTRAVENOUS

## 2013-04-21 MED ORDER — PROMETHAZINE HCL 25 MG/ML IJ SOLN: 6.2500 mg | INTRAMUSCULAR | Status: DC | PRN

## 2013-04-21 MED ORDER — AMOXICILLIN-POT CLAVULANATE 875-125 MG PO TABS: 1.0000 | ORAL_TABLET | Freq: Two times a day (BID) | ORAL | Status: DC

## 2013-04-21 MED ORDER — KETOROLAC TROMETHAMINE 30 MG/ML IJ SOLN
INTRAMUSCULAR | Status: DC | PRN
  Administered 2013-04-21: 30 mg via INTRAVENOUS

## 2013-04-21 MED ORDER — SUCCINYLCHOLINE CHLORIDE 20 MG/ML IJ SOLN
INTRAMUSCULAR | Status: DC | PRN
  Administered 2013-04-21: 100 mg via INTRAVENOUS

## 2013-04-21 MED ORDER — OXYCODONE HCL 5 MG PO TABS: 5.0000 mg | ORAL_TABLET | ORAL | Status: DC | PRN

## 2013-04-21 MED ORDER — KETAMINE HCL 10 MG/ML IJ SOLN
INTRAMUSCULAR | Status: DC | PRN
  Administered 2013-04-21: 50 mg via INTRAVENOUS

## 2013-04-21 MED ORDER — DEXAMETHASONE SODIUM PHOSPHATE 4 MG/ML IJ SOLN
INTRAMUSCULAR | Status: DC | PRN
  Administered 2013-04-21: 10 mg via INTRAVENOUS

## 2013-04-21 MED ORDER — SODIUM CHLORIDE 0.9 % IV SOLN: INTRAVENOUS | Status: DC

## 2013-04-21 MED ORDER — PANTOPRAZOLE SODIUM 40 MG PO TBEC: 40.0000 mg | DELAYED_RELEASE_TABLET | Freq: Every day | ORAL | Status: DC

## 2013-04-21 MED ORDER — SODIUM CHLORIDE 0.9 % IV SOLN
INTRAVENOUS | Status: DC | PRN
  Administered 2013-04-21: 10:00:00

## 2013-04-21 MED ORDER — MIDAZOLAM HCL 5 MG/5ML IJ SOLN
INTRAMUSCULAR | Status: DC | PRN
  Administered 2013-04-21 (×2): 1 mg via INTRAVENOUS

## 2013-04-21 MED ORDER — PANTOPRAZOLE SODIUM 40 MG IV SOLR
40.0000 mg | Freq: Once | INTRAVENOUS | Status: AC
  Administered 2013-04-21: 40 mg via INTRAVENOUS
  Filled 2013-04-21: qty 40

## 2013-04-21 NOTE — Anesthesia Postprocedure Evaluation (Signed)
  Anesthesia Post-op Note  Patient: Paul [name]  Procedure(s) Performed: Procedure(s) (LRB): ENDOSCOPIC RETROGRADE CHOLANGIOPANCREATOGRAPHY (ERCP) (N/A)  Patient Location: PACU  Anesthesia Type: General  Level of Consciousness: awake and alert   Airway and Oxygen Therapy: Patient Spontanous Breathing  Post-op Pain: mild  Post-op Assessment: Post-op Vital signs reviewed, Patient's Cardiovascular Status Stable, Respiratory Function Stable, Patent Airway and No signs of Nausea or vomiting  Last Vitals:  Filed Vitals:   04/21/13 1110  BP: 121/73  Pulse: 84  Temp: 36.6 C  Resp: 14    Post-op Vital Signs: stable   Complications: No apparent anesthesia complications

## 2013-04-21 NOTE — Progress Notes (Signed)
Patient resting prone extremities pressure point pads in place,chest rolls,face cradle in place

## 2013-04-21 NOTE — Transfer of Care (Signed)
Immediate Anesthesia Transfer of Care Note  Patient: Paul [name]  Procedure(s) Performed: Procedure(s): ENDOSCOPIC RETROGRADE CHOLANGIOPANCREATOGRAPHY (ERCP) (N/A)  Patient Location: PACU, endo  Anesthesia Type:General  Level of Consciousness: Patient easily awoken, comfortable, cooperative, following commands, responds to stimulation.   Airway & Oxygen Therapy: Patient spontaneously breathing, ventilating well, oxygen via simple oxygen mask.  Post-op Assessment: Report given to PACU RN, vital signs reviewed and stable, moving all extremities.   Post vital signs: Reviewed and stable.  Complications: No apparent anesthesia complications

## 2013-04-21 NOTE — Anesthesia Preprocedure Evaluation (Addendum)

## 2013-04-21 NOTE — Progress Notes (Signed)
Patient received discharge instructions with fiance at bedside. All verbalized understanding without further questions. Patient follow up appointments and prescriptions discussed. Patient belongings packed. Patient to be taken downstairs via wheelchair to be discharged home.

## 2013-04-21 NOTE — Discharge Summary (Signed)
Physician Discharge Summary  ECHO ALLSBROOK WUJ:[id number] DOB: 05-24-1971 DOA: 04/19/2013  PCP: Londell Moh, MD  Admit date: 04/19/2013 Discharge date: 04/21/2013  Recommendations for Outpatient Follow-up:  1. Continue Augmentin for next 7 days on discharge. 2. May take protonix daily for indigestion/acid reflux  Discharge Diagnoses:  Principal Problem:   Choledocholithiasis Active Problems:   CAP (community acquired pneumonia)   Dehydration    Discharge Condition: medically stable stable for discharge home today  Diet recommendation: as tolerated  History of present illness:  42 y.o. male who is 4 months status post a laparoscopic cholecystectomy complicated by a bile leak, prompting ERCP, sphincterotomy, and stent placement by Dr. Vida Rigger in 11/2012, with stent retrieval in 12/2012 who presented to the ED 04/19/2013 with excruciating right upper quadrant pain with radiation to the back, reminiscent of his previous gallbladder pain. Evaluation in the ED showed normal liver chemistries and lipase but CT of the abdomen showed a 5 mm density in the distal common bile duct consistent with stone. Pt underwent ERCp with stone extraction 04/21/2013.   Assessment/Plan:   Principal Problem:  Choledocholithiasis  - Seen by Dr. Matthias Hughs on 04/19/2013 and pt now s/p ERCP and stone extraction on 04/21/2013.  - initially normal LFT's but then significant elevation. Abnormal LFT's due to common bile duct obstruction.  Active Problems:  CAP (community acquired pneumonia)  - Put on empiric Zosyn. Will continue Augmentin on discharge which will cover for PNA - CT scan and plain radiographs personally reviewed. Left lower lobe infiltrate apparent. Had a cough a few days prior to admission but otherwise no clinically significant pneumonia.  Dehydration  - resolved with IV fluids   Code Status: Full.  Family Communication: Mother updated at the bedside.   Medical Consultants:  Dr. Bernette Redbird, Gastroenterology Other Consultants:  None. Anti-infectives:  Levaquin 04/19/2013---> 04/19/2013  Zosyn 04/19/2013 ---> 04/21/2013    Signed:  Manson Passey, MD  [facility] 04/21/2013, 2:42 PM  Pager #: [phone]   Discharge Exam: Filed Vitals:   04/21/13 1357  BP: 101/65  Pulse: 72  Temp: 98.3 F (36.8 C)  Resp: 16   Filed Vitals:   04/21/13 1050 04/21/13 1100 04/21/13 1110 04/21/13 1357  BP: 125/86 121/85 121/73 101/65  Pulse:   84 72  Temp:   97.8 F (36.6 C) 98.3 F (36.8 C)  TempSrc:      Resp: 14 7 14 16   Height:      Weight:      SpO2: 100% 98%  97%    General: Pt is alert, follows commands appropriately, not in acute distress Cardiovascular: Regular rate and rhythm, S1/S2 +, no murmurs, no rubs, no gallops Respiratory: Clear to auscultation bilaterally, no wheezing, no crackles, no rhonchi Abdominal: Soft, non tender, non distended, bowel sounds +, no guarding Extremities: no edema, no cyanosis, pulses palpable bilaterally DP and PT Neuro: Grossly nonfocal  Discharge Instructions  Discharge Orders   Future Orders Complete By Expires   Call MD for:  difficulty breathing, headache or visual disturbances  As directed    Call MD for:  persistant dizziness or light-headedness  As directed    Call MD for:  persistant nausea and vomiting  As directed    Call MD for:  severe uncontrolled pain  As directed    Diet - low sodium heart healthy  As directed    Discharge instructions  As directed    Comments:     1. Continue Augmentin for next  7 days on discharge. 2. May take protonix daily for indigestion/acid reflux   Increase activity slowly  As directed        Medication List    STOP taking these medications       ibuprofen 200 MG tablet  Commonly known as:  ADVIL,MOTRIN      TAKE these medications       acetaminophen 500 MG tablet  Commonly known as:  TYLENOL  Take 1,000 mg by mouth every 6 (six) hours as needed for pain.      amoxicillin-clavulanate 875-125 MG per tablet  Commonly known as:  AUGMENTIN  Take 1 tablet by mouth 2 (two) times daily.     oxyCODONE 5 MG immediate release tablet  Commonly known as:  Oxy IR/ROXICODONE  Take 1 tablet (5 mg total) by mouth every 4 (four) hours as needed.     pantoprazole 40 MG tablet  Commonly known as:  PROTONIX  Take 1 tablet (40 mg total) by mouth daily at 6 (six) AM.     zolpidem 10 MG tablet  Commonly known as:  AMBIEN  Take 5-10 mg by mouth at bedtime as needed for sleep.           Follow-up Information   Follow up with Londell Moh, MD In 1 week.   Specialty:  Internal Medicine   Contact information:   [address] [place] [address] [phone]        The results of significant diagnostics from this hospitalization (including imaging, microbiology, ancillary and laboratory) are listed below for reference.    Significant Diagnostic Studies: Dg Chest 2 View  04/19/2013   CLINICAL DATA:  Cough.  Left lower lobe infiltrate.  EXAM: CHEST  2 VIEW  COMPARISON:  CT scan of the abdomen dated 04/19/2013  FINDINGS: There is a focal consolidative infiltrate and atelectasis in the posterior aspect of the left lower lobe which correlates with the abnormality seen on CT scan.  The heart size and pulmonary vascularity are normal and the lungs are otherwise clear. No osseous abnormality.  IMPRESSION: Left lower lobe infiltrate.   Electronically Signed   By: Geanie Cooley   On: 04/19/2013 17:59   Ct Abdomen Pelvis W Contrast  04/19/2013   CLINICAL DATA:  Upper abdominal pain, nausea and vomiting  EXAM: CT ABDOMEN AND PELVIS WITH CONTRAST  TECHNIQUE: Multidetector CT imaging of the abdomen and pelvis was performed using the standard protocol following bolus administration of intravenous contrast.  CONTRAST:  OMNIPAQUE IOHEXOL 300 MG/ML  SOLN  COMPARISON:  None.  FINDINGS: There is confluent left lower lobe superior segmental airspace  consolidation with air bronchogram formation.  Mild central intrahepatic ductal prominence noted as well as tortuosity of the mildly dilated 7 mm common duct and a 5 mm hyperdense filling defect just proximal to the ampulla within the intrapancreatic portion of the common duct image 37. 3 mm too small to characterize hypodense lesion at the dome of the right hepatic lobe image 13. Several 3 and 4 mm too small to characterize hypodense lesions are noted in the right renal cortex. No hydronephrosis or enhancing mass. No radiopaque renal or ureteral calculus. Left kidney, adrenal glands, spleen, and pancreas otherwise unremarkable. No free air, free fluid, or lymphadenopathy.  The bowel is normal. Appendix is normal. Bladder is normal. No pelvic free fluid or lymphadenopathy.  No acute osseous abnormality.  IMPRESSION: 5 mm common duct stone within the intrapancreatic portion of the common duct,  with mild intrahepatic ductal dilatation and common duct dilation to 7 mm. This could represent symptomatic choledocholithiasis.  Left lower lobe superior segmental consolidation with an appearance suggestive of pneumonia or aspiration.  These results were called by telephone at the time of interpretation on 04/19/2013 at 12:42 PM to Dr. Jaci Carrel , who verbally acknowledged these results.   Electronically Signed   By: Christiana Pellant M.D.   On: 04/19/2013 12:42   Dg Ercp Biliary & Pancreatic Ducts  04/21/2013   *RADIOLOGY REPORT*  Clinical Data: CBD stones, gallstone removal with sphincterotomy  ERCP  Comparison: Intraoperative cholangiographic images during laparoscopic cholecystectomy - 11/20/2012; ERCP - 11/22/2012; CT abdomen pelvis - 04/19/2013  Findings:  Three spot radiographic images during ERCP are provided for review.  Initial radiograph demonstrates an ERCP probe overlying the right upper abdominal quadrant.  There is selective cannulation and opacification of the mid and distal aspect of the common bile  duct which appears nondilated.  There are several punctate filling defects within the distal aspect of the common bile duct, one of which may represent the CBD stone seen on preceding abdominal CT. Additional filling defects may represent air bubbles.  Subsequent images demonstrated installation of a biliary sphincterotomy balloon within the distal aspect of the CBD.  Cholecystectomy clips overlie the gallbladder fossa.  There is minimal opacification of the residual cystic duct remnant.  There is mild dilatation of the intrahepatic biliary tree with apparent mild dilatation of the central aspect of the left intrahepatic biliary system, similar to prior abdominal CT.  IMPRESSION: ERCP with biliary sweeping and sphincterotomy as above.   Original Report Authenticated By: Tacey Ruiz, MD    Microbiology: Recent Results (from the past 240 hour(s))  CULTURE, BLOOD (ROUTINE X 2)     Status: None   Collection Time    04/19/13 12:49 PM      Result Value Range Status   Specimen Description BLOOD RIGHT ARM   Final   Special Requests BOTTLES DRAWN AEROBIC ONLY 3CC   Final   Culture  Setup Time     Final   Value: 04/19/2013 16:21     Performed at [facility]   Culture     Final   Value:        BLOOD CULTURE RECEIVED NO GROWTH TO DATE CULTURE WILL BE HELD FOR 5 DAYS BEFORE ISSUING A FINAL NEGATIVE REPORT     Performed at [facility]   Report Status PENDING   Incomplete  CULTURE, BLOOD (ROUTINE X 2)     Status: None   Collection Time    04/19/13 12:55 PM      Result Value Range Status   Specimen Description BLOOD RIGHT HAND   Final   Special Requests BOTTLES DRAWN AEROBIC ONLY 3CC   Final   Culture  Setup Time     Final   Value: 04/19/2013 16:20     Performed at [facility]   Culture     Final   Value:        BLOOD CULTURE RECEIVED NO GROWTH TO DATE CULTURE WILL BE HELD FOR 5 DAYS BEFORE ISSUING A FINAL NEGATIVE REPORT     Performed at [facility]   Report Status  PENDING   Incomplete     Labs: Basic Metabolic Panel:  Recent Labs Lab 04/19/13 1025 04/19/13 1605 04/20/13 0345 04/21/13 0355  NA 139  --  137 137  K 4.0  --  4.0 4.0  CL 101  --  100 101  CO2 27  --  26 29  GLUCOSE 127*  --  113* 97  BUN 12  --  9 5*  CREATININE 1.02 0.92 1.07 1.04  CALCIUM 9.7  --  9.1 9.2  MG  --  2.2  --   --    Liver Function Tests:  Recent Labs Lab 04/19/13 1025 04/20/13 0345 04/21/13 0355  AST 34 428* 208*  ALT 36 494* 449*  ALKPHOS 91 176* 195*  BILITOT 0.4 2.0* 4.0*  PROT 7.7 6.5 6.8  ALBUMIN 4.6 3.9 4.0    Recent Labs Lab 04/19/13 1025  LIPASE 35   No results found for this basename: AMMONIA,  in the last 168 hours CBC:  Recent Labs Lab 04/19/13 1025 04/19/13 1605 04/20/13 0345 04/21/13 0355  WBC 7.4 8.3 6.7 5.1  NEUTROABS 4.5  --   --   --   HGB 14.0 13.5 12.4* 12.4*  HCT 39.6 38.5* 35.1* 35.8*  MCV 86.3 86.3 86.5 87.7  PLT 168 168 149* 157   Cardiac Enzymes: No results found for this basename: CKTOTAL, CKMB, CKMBINDEX, TROPONINI,  in the last 168 hours BNP: BNP (last 3 results) No results found for this basename: PROBNP,  in the last 8760 hours CBG: No results found for this basename: GLUCAP,  in the last 168 hours  Time coordinating discharge: Over 30 minutes

## 2013-04-21 NOTE — Op Note (Signed)
[facility] [address] [place], [address]   ERCP PROCEDURE REPORT  PATIENT: Paul [name], Paul [name].  MR# :[id number] BIRTHDATE: [birth year]  GENDER: Male ENDOSCOPIST: Vida Rigger, MD REFERRED BY: PROCEDURE DATE:  04/21/2013 PROCEDURE:   ERCP with removal of calculus/calculi and ERCP with sphincterotomy/papillotomy ASA CLASS: INDICATIONS: MEDICATIONS: TOPICAL ANESTHETIC:  DESCRIPTION OF PROCEDURE:   After the risks benefits and alternatives of the procedure were thoroughly explained, informed consent was obtained.  The Pentax ERCP [id number]  endoscope was introduced through the mouth and advanced to the second portion of the duodenum .A slightly bulbous ampulla was brought into view and on the first 3 cannulation attempts the wire went towards the pancreas and we left a wire in the pancreas and then noticed there was a second orifice and deep selective CBD cannulation was obtained and the wire was removed from the pancreas and there was no pancreatic duct injections. On initial cholangiogram we thought we saw a small stone and we proceeded with a moderately large sphincterotomy in the customary fashion until we had adequate biliary drainage and could easily get the fully bowed sphincterotome in and out of the duct and proceeded with multiple balloon pull-throughs and two occlusion cholangiograms and although no stone was seen being delivered we did not see any stone on the 2 final cholangiograms and there was excellent biliary drainage and no signs of any leak and we elected to stop this procedure at this point and the scope was removed the patient tolerated the procedure well there was no obvious immediate complication                 COMPLICATIONS:  none  ENDOSCOPIC IMPRESSION:1. Questionable stone on initial cholangiogram status post moderately large sphincterotomy and multiple balloon pull-through and negative occlusion cholangiogram x2 at the end  of the procedure 2. 3 wire advancements towards the pancreas without injection3. Unfortunately no stones seen delivered but none at the end of the procedure  RECOMMENDATIONS:observe for delayed complications if none slowly advance diet and home later today or tomorrow if doing well and follow up in the office in one to 2 weeks to recheck symptoms liver tests and make sure no further workup and plans are needed     _______________________________ eSigned:  Vida Rigger, MD 04/21/2013 9:45 AM   CC:  PATIENT NAME:  Paul [name], Paul [name] MR#: [id number]

## 2013-04-21 NOTE — Preoperative (Signed)
Beta Blockers   Reason not to administer Beta Blockers:Not Applicable, not on home BB 

## 2013-04-21 NOTE — Progress Notes (Signed)
Paul [name] 8:39 AM  Subjective: Patient without any new complaints and no pain this morning and we rediscussed the procedure with he and his fianc and he has no other complaints  Objective: Vital signs stable afebrile no acute distress physical exam please see pre-assessment evaluation LFTs increased white count normal  Assessment: CBD stone  Plan: Okay to proceed with ERCP and stone extraction and anesthesia this morning  Aamir Mclinden E

## 2013-04-22 ENCOUNTER — Encounter (HOSPITAL_COMMUNITY): Payer: Self-pay | Admitting: Gastroenterology

## 2013-04-25 LAB — CULTURE, BLOOD (ROUTINE X 2): Culture: NO GROWTH

## 2016-01-11 ENCOUNTER — Emergency Department (HOSPITAL_COMMUNITY)
Admission: EM | Admit: 2016-01-11 | Discharge: 2016-01-11 | Disposition: A | Discharge status: Home / Self Care | Payer: BLUE CROSS/BLUE SHIELD | Attending: Emergency Medicine | Admitting: Emergency Medicine

## 2016-01-11 ENCOUNTER — Encounter (HOSPITAL_COMMUNITY): Payer: Self-pay | Admitting: Family Medicine

## 2016-01-11 ENCOUNTER — Other Ambulatory Visit: Payer: Self-pay

## 2016-01-11 ENCOUNTER — Emergency Department (HOSPITAL_COMMUNITY): Payer: BLUE CROSS/BLUE SHIELD

## 2016-01-11 DIAGNOSIS — N200 Calculus of kidney: Secondary | ICD-10-CM

## 2016-01-11 DIAGNOSIS — N132 Hydronephrosis with renal and ureteral calculous obstruction: Secondary | ICD-10-CM | POA: Diagnosis not present

## 2016-01-11 DIAGNOSIS — R1032 Left lower quadrant pain: Secondary | ICD-10-CM | POA: Diagnosis present

## 2016-01-11 DIAGNOSIS — N202 Calculus of kidney with calculus of ureter: Secondary | ICD-10-CM | POA: Insufficient documentation

## 2016-01-11 LAB — COMPREHENSIVE METABOLIC PANEL
ALBUMIN: 4.9 g/dL (ref 3.5–5.0)
ALT: 34 U/L (ref 17–63)
ANION GAP: 10 (ref 5–15)
AST: 27 U/L (ref 15–41)
Alkaline Phosphatase: 52 U/L (ref 38–126)
BILIRUBIN TOTAL: 0.9 mg/dL (ref 0.3–1.2)
BUN: 18 mg/dL (ref 6–20)
CO2: 24 mmol/L (ref 22–32)
Calcium: 9.8 mg/dL (ref 8.9–10.3)
Chloride: 105 mmol/L (ref 101–111)
Creatinine, Ser: 1.28 mg/dL — ABNORMAL HIGH (ref 0.61–1.24)
GFR calc non Af Amer: >60 mL/min (ref >60)
GLUCOSE: 138 mg/dL — AB (ref 65–99)
POTASSIUM: 4 mmol/L (ref 3.5–5.1)
Sodium: 139 mmol/L (ref 135–145)
TOTAL PROTEIN: 7.3 g/dL (ref 6.5–8.1)

## 2016-01-11 LAB — URINALYSIS, ROUTINE W REFLEX MICROSCOPIC
Bilirubin Urine: NEGATIVE
Glucose, UA: NEGATIVE mg/dL
Ketones, ur: 15 mg/dL — AB
NITRITE: NEGATIVE
Protein, ur: 30 mg/dL — AB
SPECIFIC GRAVITY, URINE: 1.023 (ref 1.005–1.030)
pH: 6 (ref 5.0–8.0)

## 2016-01-11 LAB — URINE MICROSCOPIC-ADD ON

## 2016-01-11 LAB — CBC
HCT: 39.1 % (ref 39.0–52.0)
HEMOGLOBIN: 13.1 g/dL (ref 13.0–17.0)
MCH: 29.3 pg (ref 26.0–34.0)
MCHC: 33.5 g/dL (ref 30.0–36.0)
MCV: 87.5 fL (ref 78.0–100.0)
Platelets: 154 10*3/uL (ref 150–400)
RBC: 4.47 MIL/uL (ref 4.22–5.81)
RDW: 13.2 % (ref 11.5–15.5)
WBC: 7 10*3/uL (ref 4.0–10.5)

## 2016-01-11 MED ORDER — ONDANSETRON 4 MG PO TBDP
ORAL_TABLET | ORAL | Status: DC
Start: 2016-01-11 — End: 2020-11-28

## 2016-01-11 MED ORDER — ONDANSETRON HCL 4 MG/2ML IJ SOLN
4.0000 mg | Freq: Once | INTRAMUSCULAR | Status: AC
  Administered 2016-01-11: 4 mg via INTRAVENOUS
  Filled 2016-01-11: qty 2

## 2016-01-11 MED ORDER — HYDROMORPHONE HCL 1 MG/ML IJ SOLN: 1.0000 mg | Freq: Once | INTRAMUSCULAR | Status: DC

## 2016-01-11 MED ORDER — TAMSULOSIN HCL 0.4 MG PO CAPS: 0.4000 mg | ORAL_CAPSULE | Freq: Every day | ORAL | Status: DC

## 2016-01-11 MED ORDER — KETOROLAC TROMETHAMINE 30 MG/ML IJ SOLN
30.0000 mg | Freq: Once | INTRAMUSCULAR | Status: AC
  Administered 2016-01-11: 30 mg via INTRAVENOUS
  Filled 2016-01-11: qty 1

## 2016-01-11 MED ORDER — ONDANSETRON HCL 4 MG/2ML IJ SOLN: 4.0000 mg | Freq: Once | INTRAMUSCULAR | Status: DC | PRN

## 2016-01-11 MED ORDER — OXYCODONE-ACETAMINOPHEN 5-325 MG PO TABS: 1.0000 | ORAL_TABLET | ORAL | Status: DC | PRN

## 2016-01-11 NOTE — ED Notes (Signed)
Pt ambulates independently and with steady gait at time of discharge. Discharge instructions and follow up information reviewed with patient. No other questions or concerns voiced at this time.  

## 2016-01-11 NOTE — ED Provider Notes (Signed)
CSN: [id number]     Arrival date & time 01/11/16  0902 History   First MD Initiated Contact with Patient 01/11/16 [phone]     Chief Complaint  Patient presents with  . Abdominal Pain     (Consider location/radiation/quality/duration/timing/severity/associated sxs/prior Treatment) Patient is a 45 y.o. male presenting with abdominal pain. The history is provided by the patient (Patient complains of left lower quadrant abdominal pain).  Abdominal Pain Pain location:  LLQ Pain quality: aching   Pain radiation: Radiates left flank. Pain severity:  Moderate Onset quality:  Sudden Timing:  Constant Progression:  Unchanged Chronicity:  New Context: not alcohol use   Associated symptoms: no chest pain, no cough, no diarrhea, no fatigue and no hematuria     Past Medical History  Diagnosis Date  . Abdominal pain   . Cholelithiasis   . Insomnia    Past Surgical History  Procedure Laterality Date  . Hernia repair      Childhood./ inguinal  . Ercp N/A 11/22/2012    Procedure: ENDOSCOPIC RETROGRADE CHOLANGIOPANCREATOGRAPHY (ERCP);  Surgeon: Jeryl Columbia, MD;  Location: [facility];  Service: Endoscopy;  Laterality: N/A;  . Cholecystectomy N/A 11/20/2012    Procedure: LAPAROSCOPIC CHOLECYSTECTOMY converted to open cholecystectomy with  CHOLANGIOGRAM;  Surgeon: Odis Hollingshead, MD;  Location: [facility];  Service: General;  Laterality: N/A;  . Wisdom tooth extraction    . Esophagogastroduodenoscopy N/A 01/01/2013    Procedure: ESOPHAGOGASTRODUODENOSCOPY (EGD);  Surgeon: Jeryl Columbia, MD;  Location: [facility] ENDOSCOPY;  Service: Endoscopy;  Laterality: N/A;  . Ercp N/A 04/21/2013    Procedure: ENDOSCOPIC RETROGRADE CHOLANGIOPANCREATOGRAPHY (ERCP);  Surgeon: Jeryl Columbia, MD;  Location: [facility];  Service: Endoscopy;  Laterality: N/A;   Family History  Problem Relation Age of Onset  . Cancer Father     lung   Social History  Substance Use Topics  . Smoking status: Never Smoker   . Smokeless tobacco:  Never Used  . Alcohol Use: Yes     Comment: 1-2 drinks/week    Review of Systems  Constitutional: Negative for appetite change and fatigue.  HENT: Negative for congestion, ear discharge and sinus pressure.   Eyes: Negative for discharge.  Respiratory: Negative for cough.   Cardiovascular: Negative for chest pain.  Gastrointestinal: Positive for abdominal pain. Negative for diarrhea.  Genitourinary: Negative for frequency and hematuria.  Musculoskeletal: Negative for back pain.  Skin: Negative for rash.  Neurological: Negative for seizures and headaches.  Psychiatric/Behavioral: Negative for hallucinations.      Allergies  Dilaudid and Lunesta  Home Medications   Prior to Admission medications   Medication Sig Start Date End Date Taking? Authorizing Provider  acetaminophen (TYLENOL) 500 MG tablet Take 1,000 mg by mouth every 6 (six) hours as needed for pain.   Yes Historical Provider, MD  amoxicillin-clavulanate (AUGMENTIN) 875-125 MG per tablet Take 1 tablet by mouth 2 (two) times daily. Patient not taking: Reported on 01/11/2016 04/21/13   Robbie Lis, MD  ondansetron [facility] ODT) 4 MG disintegrating tablet 23m ODT q4 hours prn nausea/vomit 01/11/16   JMilton Ferguson MD  oxyCODONE (OXY IR/ROXICODONE) 5 MG immediate release tablet Take 1 tablet (5 mg total) by mouth every 4 (four) hours as needed. Patient not taking: Reported on 01/11/2016 04/21/13   ARobbie Lis MD  oxyCODONE-acetaminophen (PERCOCET/ROXICET) 5-325 MG tablet Take 1 tablet by mouth every 4 (four) hours as needed for severe pain. 01/11/16   JMilton Ferguson MD  pantoprazole (PROTONIX) 40 MG tablet  Take 1 tablet (40 mg total) by mouth daily at 6 (six) AM. Patient not taking: Reported on 01/11/2016 04/21/13   Robbie Lis, MD  tamsulosin (FLOMAX) 0.4 MG CAPS capsule Take 1 capsule (0.4 mg total) by mouth daily. 01/11/16   Milton Ferguson, MD   BP 101/65 mmHg  Pulse 66  Resp 15  SpO2 96% Physical Exam  Constitutional:  He is oriented to person, place, and time. He appears well-developed.  HENT:  Head: Normocephalic.  Eyes: Conjunctivae and EOM are normal. No scleral icterus.  Neck: Neck supple. No thyromegaly present.  Cardiovascular: Normal rate and regular rhythm.  Exam reveals no gallop and no friction rub.   No murmur heard. Pulmonary/Chest: No stridor. He has no wheezes. He has no rales. He exhibits no tenderness.  Abdominal: He exhibits no distension. There is tenderness. There is no rebound.  Genitourinary:  Tender left flank  Musculoskeletal: Normal range of motion. He exhibits no edema.  Lymphadenopathy:    He has no cervical adenopathy.  Neurological: He is oriented to person, place, and time. He exhibits normal muscle tone. Coordination normal.  Skin: No rash noted. No erythema.  Psychiatric: He has a normal mood and affect. His behavior is normal.    ED Course  Procedures (including critical care time) Labs Review Labs Reviewed  COMPREHENSIVE METABOLIC PANEL - Abnormal; Notable for the following:    Glucose, Bld 138 (*)    Creatinine, Ser 1.28 (*)    All other components within normal limits  URINALYSIS, ROUTINE W REFLEX MICROSCOPIC (NOT AT [facility]) - Abnormal; Notable for the following:    APPearance HAZY (*)    Hgb urine dipstick LARGE (*)    Ketones, ur 15 (*)    Protein, ur 30 (*)    Leukocytes, UA TRACE (*)    All other components within normal limits  URINE MICROSCOPIC-ADD ON - Abnormal; Notable for the following:    Squamous Epithelial / LPF 0-5 (*)    Bacteria, UA FEW (*)    All other components within normal limits  CBC    Imaging Review Ct Renal Stone Study  01/11/2016  CLINICAL DATA:  Left side abdominal pain starting 7: 45 a.m., history of renal stones EXAM: CT ABDOMEN AND PELVIS WITHOUT CONTRAST TECHNIQUE: Multidetector CT imaging of the abdomen and pelvis was performed following the standard protocol without IV contrast. COMPARISON:  04/19/2013 FINDINGS: Lower  chest:  The lung bases are unremarkable.  Tiny hiatal hernia. Hepatobiliary: Unenhanced liver shows no biliary ductal dilatation. Status postcholecystectomy. Pancreas: Unenhanced pancreas is unremarkable. Spleen: Unenhanced spleen is unremarkable. Adrenals/Urinary Tract: No adrenal gland mass. There is a punctate nonobstructive calcification in lower pole of the right kidney measures 1.5 mm. Nonobstructive punctate calculus in upper pole of the left kidney measures 1.4 mm. There is mild left hydronephrosis and proximal left hydroureter. Axial image 52 there is 3 mm calcified obstructive calculus in proximal left ureter about the level of lower endplate of L3 vertebral body. Bilateral distal ureter is unremarkable. Urinary bladder is empty. No calcified calculi are noted within urinary bladder. Stomach/Bowel: There is no small bowel obstruction. No gastric outlet obstruction. No pericecal inflammation. Normal appendix is noted in axial image 56. Moderate stool noted in distal sigmoid colon. No distal colitis or diverticulitis. No colonic obstruction. Vascular/Lymphatic: No aortic aneurysm. No retroperitoneal or mesenteric adenopathy. Reproductive: Prostate gland and seminal vesicles are unremarkable. Other: No ascites or free abdominal air.  No inguinal adenopathy. Musculoskeletal: No destructive bony lesions  are noted. There is significant disc space flattening with vacuum disc phenomenon and mild anterior spurring at L4-L5 level. 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 Minimal anterolisthesis about 3 mm L5 on S1 vertebral body. Degenerative changes bilateral SI joints. IMPRESSION: 1. There is bilateral nonobstructive nephrolithiasis. 2. Mild left hydronephrosis and proximal left hydroureter. 3. Axial image 52 there is 3 mm calcified obstructive calculus in proximal left ureter at the level of lower endplate of L3 vertebral body. 4. Normal appendix. No small bowel obstruction. No pericecal inflammation. 5.  Bilateral pars defect at L5 level. Degenerative changes at L5-S1 level. There is about 3 mm anterolisthesis L5 on S1 vertebral body. Electronically Signed   By: Lahoma Crocker M.D.   On: 01/11/2016 11:01   I have personally reviewed and evaluated these images and lab results as part of my medical decision-making.   EKG Interpretation None      MDM   Final diagnoses:  Kidney stone    Patient with 3 mm left ureteral stone. Patient sent home with pain medicine and nausea medicine Flomax will follow-up with urology next week    Milton Ferguson, MD 01/11/16 1241

## 2016-01-11 NOTE — ED Notes (Signed)
Pt here for lower abd pain radiating into back. sts started about 7:45. sts some nausea. Pt HR 45 at triage. Performing EKG. Denies any hematuria or urinary retention.

## 2016-01-11 NOTE — Discharge Instructions (Signed)
Follow up with [facility] next week

## 2016-03-01 DIAGNOSIS — Z0000 Encounter for general adult medical examination without abnormal findings: Secondary | ICD-10-CM | POA: Diagnosis not present

## 2016-03-04 DIAGNOSIS — Z0000 Encounter for general adult medical examination without abnormal findings: Secondary | ICD-10-CM | POA: Diagnosis not present

## 2016-03-04 DIAGNOSIS — Z8701 Personal history of pneumonia (recurrent): Secondary | ICD-10-CM | POA: Diagnosis not present

## 2016-03-04 DIAGNOSIS — Z1212 Encounter for screening for malignant neoplasm of rectum: Secondary | ICD-10-CM | POA: Diagnosis not present

## 2016-03-04 DIAGNOSIS — J309 Allergic rhinitis, unspecified: Secondary | ICD-10-CM | POA: Diagnosis not present

## 2016-03-04 DIAGNOSIS — Z9049 Acquired absence of other specified parts of digestive tract: Secondary | ICD-10-CM | POA: Diagnosis not present

## 2016-03-06 DIAGNOSIS — N200 Calculus of kidney: Secondary | ICD-10-CM | POA: Diagnosis not present

## 2017-03-10 DIAGNOSIS — Z0000 Encounter for general adult medical examination without abnormal findings: Secondary | ICD-10-CM | POA: Diagnosis not present

## 2017-06-18 DIAGNOSIS — Z8701 Personal history of pneumonia (recurrent): Secondary | ICD-10-CM | POA: Diagnosis not present

## 2017-06-18 DIAGNOSIS — Z9049 Acquired absence of other specified parts of digestive tract: Secondary | ICD-10-CM | POA: Diagnosis not present

## 2017-06-18 DIAGNOSIS — Z0000 Encounter for general adult medical examination without abnormal findings: Secondary | ICD-10-CM | POA: Diagnosis not present

## 2017-06-18 DIAGNOSIS — J309 Allergic rhinitis, unspecified: Secondary | ICD-10-CM | POA: Diagnosis not present

## 2017-09-19 DIAGNOSIS — R1084 Generalized abdominal pain: Secondary | ICD-10-CM | POA: Diagnosis not present

## 2017-09-19 DIAGNOSIS — N200 Calculus of kidney: Secondary | ICD-10-CM | POA: Diagnosis not present

## 2017-10-16 DIAGNOSIS — G4700 Insomnia, unspecified: Secondary | ICD-10-CM | POA: Diagnosis not present

## 2017-10-20 DIAGNOSIS — N200 Calculus of kidney: Secondary | ICD-10-CM | POA: Diagnosis not present

## 2020-09-29 ENCOUNTER — Encounter: Payer: Self-pay | Admitting: Family Medicine

## 2020-10-21 LAB — COLOGUARD: COLOGUARD: NEGATIVE

## 2020-11-28 ENCOUNTER — Other Ambulatory Visit: Payer: Self-pay

## 2020-11-28 ENCOUNTER — Ambulatory Visit: Payer: 59 | Admitting: Neurology

## 2020-11-28 ENCOUNTER — Telehealth: Payer: Self-pay | Admitting: Neurology

## 2020-11-28 ENCOUNTER — Encounter: Payer: Self-pay | Admitting: Neurology

## 2020-11-28 VITALS — BP 113/74 | HR 58 | Ht 70.0 in | Wt 222.0 lb

## 2020-11-28 DIAGNOSIS — R410 Disorientation, unspecified: Secondary | ICD-10-CM | POA: Insufficient documentation

## 2020-11-28 DIAGNOSIS — G40209 Localization-related (focal) (partial) symptomatic epilepsy and epileptic syndromes with complex partial seizures, not intractable, without status epilepticus: Secondary | ICD-10-CM

## 2020-11-28 MED ORDER — LAMOTRIGINE 100 MG PO TABS: 100.0000 mg | ORAL_TABLET | Freq: Two times a day (BID) | ORAL | 11 refills | Status: DC

## 2020-11-28 MED ORDER — LAMOTRIGINE 25 MG PO TABS: ORAL_TABLET | ORAL | 0 refills | Status: DC

## 2020-11-28 NOTE — Telephone Encounter (Signed)
[employer] order sent to GI. They will reach out to the patient to scheduled and obtain the auth.

## 2020-11-28 NOTE — Progress Notes (Signed)
Chief Complaint  Patient presents with  . New Patient (Initial Visit)    Reports one blacking out episode. He started "feeling funny" while he was brushing his teeth than passed out in the bathroom floor. His wife was in the room with him. She told him his eye were open with facial twitching. He does not remember her calling his name. Estimates being out for around one minute. Afterwards, he was a little disoriented. He returned to baseline within 30-60 minutes. He has had similar feelings of disorientation while working but no further events of loss of consciousness.      ASSESSMENT AND PLAN  Paul [name] is a 50 y.o. male   Complex partial seizure  MRI of the brain with without contrast  Brain laboratory evaluation next follow-up visit  EEG  No driving until episode free for 6 months,  After discussed with patient, will proceed with lamotrigine 25 mg titrating to 100 mg twice a day, call clinic for recurrent spells   DIAGNOSTIC DATA (LABS, IMAGING, TESTING) - I reviewed patient records, labs, notes, testing and imaging myself where available.   HISTORICAL  Paul [name] is a 50 year old male, seen in request by his primary care physician Dr. Shelia Media, [facility] Jew for evaluation of passing out spells, initial evaluation was on Nov 28, 2020  I reviewed and summarized the referring note.  He was previously healthy, no family history of seizure, works Network engineer job, on [employer], lives at home with his wife, and 3 children at age 25, 78, 57-month  In February 2022, while he and his wife was help kids with their daily routine, he was brushing teeth for his little one, he suddenly felt cloudy, dizziness, he tried to find a place to rest, suddenly fell to the ground on the right side, wife noticed he has facial twitching, he has transient loss of consciousness, postevent confusion, whole episode take about 30 minutes to clear  About 2 weeks later in March 2022, he experienced another event at work, he  was talking on the phone, he felt abnormal sensation over taking him, word finding difficulty, dizziness, trouble thinking lasting for couple minutes, then cleared up  He had a history of intermittent headaches, often right retro-orbital pressure, sometimes pounding headache, lasting couple hours, sometimes whole day, he used to take Sudafed, consider it is allergy related  He denies other persistent lateralized motor or sensory deficit no visual changes   REVIEW OF SYSTEMS:  Full 14 system review of systems performed and notable only for as above All other review of systems were negative.  PHYSICAL EXAM:   Vitals:   11/28/20 0837  BP: 113/74  Pulse: (!) 58  Weight: 222 lb (100.7 kg)  Height: 5' 10"  (1.778 m)   Not recorded     Body mass index is 31.85 kg/m.  PHYSICAL EXAMNIATION:  Gen: NAD, conversant, well nourised, well groomed                     Cardiovascular: Regular rate rhythm, no peripheral edema, warm, nontender. Eyes: Conjunctivae clear without exudates or hemorrhage Neck: Supple, no carotid bruits. Pulmonary: Clear to auscultation bilaterally   NEUROLOGICAL EXAM:  MENTAL STATUS: Speech:    Speech is normal; fluent and spontaneous with normal comprehension.  Cognition:     Orientation to time, place and person     Normal recent and remote memory     Normal Attention span and concentration     Normal Language, naming,  repeating,spontaneous speech     Fund of knowledge   CRANIAL NERVES: CN II: Visual fields are full to confrontation. Pupils are round equal and briskly reactive to light. CN III, IV, VI: extraocular movement are normal. No ptosis. CN V: Facial sensation is intact to light touch CN VII: Face is symmetric with normal eye closure  CN VIII: Hearing is normal to causal conversation. CN IX, X: Phonation is normal. CN XI: Head turning and shoulder shrug are intact  MOTOR: There is no pronator drift of out-stretched arms. Muscle bulk and tone  are normal. Muscle strength is normal.  REFLEXES: Reflexes are 2+ and symmetric at the biceps, triceps, knees, and ankles. Plantar responses are flexor.  SENSORY: Intact to light touch, pinprick and vibratory sensation are intact in fingers and toes.  COORDINATION: There is no trunk or limb dysmetria noted.  GAIT/STANCE: Posture is normal. Gait is steady with normal steps, base, arm swing, and turning. Heel and toe walking are normal. Tandem gait is normal.  Romberg is absent.  ALLERGIES: Allergies  Allergen Reactions  . Dilaudid [Hydromorphone Hcl] Anaphylaxis    Appears to tolerate morphine and oxycodone.  Johnnye Sima [Eszopiclone] Other (See Comments)    Bad taste    HOME MEDICATIONS: Current Outpatient Medications  Medication Sig Dispense Refill  . acetaminophen (TYLENOL) 500 MG tablet Take 500 mg by mouth as needed for pain.     No current facility-administered medications for this visit.    PAST MEDICAL HISTORY: Past Medical History:  Diagnosis Date  . Abdominal pain   . Cholelithiasis   . Insomnia   . Syncope     PAST SURGICAL HISTORY: Past Surgical History:  Procedure Laterality Date  . CHOLECYSTECTOMY N/A 11/20/2012   Procedure: LAPAROSCOPIC CHOLECYSTECTOMY converted to open cholecystectomy with  CHOLANGIOGRAM;  Surgeon: Odis Hollingshead, MD;  Location: [facility];  Service: General;  Laterality: N/A;  . ERCP N/A 11/22/2012   Procedure: ENDOSCOPIC RETROGRADE CHOLANGIOPANCREATOGRAPHY (ERCP);  Surgeon: Jeryl Columbia, MD;  Location: [facility];  Service: Endoscopy;  Laterality: N/A;  . ERCP N/A 04/21/2013   Procedure: ENDOSCOPIC RETROGRADE CHOLANGIOPANCREATOGRAPHY (ERCP);  Surgeon: Jeryl Columbia, MD;  Location: [facility];  Service: Endoscopy;  Laterality: N/A;  . ESOPHAGOGASTRODUODENOSCOPY N/A 01/01/2013   Procedure: ESOPHAGOGASTRODUODENOSCOPY (EGD);  Surgeon: Jeryl Columbia, MD;  Location: [facility] END[facility]COPY;  Service: Endoscopy;  Laterality: N/A;  . HERNIA REPAIR      Childhood./ inguinal  . WISDOM TOOTH EXTRACTION      FAMILY HISTORY: Family History  Problem Relation Age of Onset  . Healthy Mother   . Lung cancer Father     SOCIAL HISTORY: Social History   Socioeconomic History  . Marital status: Married    Spouse name: Not on file  . Number of children: 3  . Years of education: college  . Highest education level: Not on file  Occupational History  . Occupation: Press photographer  Tobacco Use  . Smoking status: Never Smoker  . Smokeless tobacco: Never Used  Substance and Sexual Activity  . Alcohol use: Yes    Comment: 1-2 drinks/week  . Drug use: Never  . Sexual activity: Never  Other Topics Concern  . Not on file  Social History Narrative   Lives at home with family.   Right-handed.   Caffeine use: 2 cups per day.   Social Determinants of Health   Financial Resource Strain: Not on file  Food Insecurity: Not on file  Transportation Needs: Not on file  Physical  Activity: Not on file  Stress: Not on file  Social Connections: Not on file  Intimate Partner Violence: Not on file      Marcial Pacas, M.D. Ph.D.  [facility] [address], [place], [place] [address] Ph: [phone] Fax: [phone]  CC:  Deland Pretty, MD [place],  [place] [address]  Deland Pretty, MD

## 2021-06-07 ENCOUNTER — Ambulatory Visit: Payer: Self-pay | Admitting: Neurology

## 2021-06-07 ENCOUNTER — Encounter: Payer: Self-pay | Admitting: Neurology

## 2021-07-22 DIAGNOSIS — R569 Unspecified convulsions: Secondary | ICD-10-CM

## 2021-07-22 HISTORY — DX: Unspecified convulsions: R56.9

## 2021-09-24 DIAGNOSIS — Z125 Encounter for screening for malignant neoplasm of prostate: Secondary | ICD-10-CM | POA: Diagnosis not present

## 2021-09-24 DIAGNOSIS — R6882 Decreased libido: Secondary | ICD-10-CM | POA: Diagnosis not present

## 2021-09-24 DIAGNOSIS — Z6833 Body mass index (BMI) 33.0-33.9, adult: Secondary | ICD-10-CM | POA: Diagnosis not present

## 2021-09-24 DIAGNOSIS — F5104 Psychophysiologic insomnia: Secondary | ICD-10-CM | POA: Diagnosis not present

## 2021-09-24 DIAGNOSIS — Z0001 Encounter for general adult medical examination with abnormal findings: Secondary | ICD-10-CM | POA: Diagnosis not present

## 2021-09-27 DIAGNOSIS — Z6833 Body mass index (BMI) 33.0-33.9, adult: Secondary | ICD-10-CM | POA: Diagnosis not present

## 2021-09-27 DIAGNOSIS — Z23 Encounter for immunization: Secondary | ICD-10-CM | POA: Diagnosis not present

## 2021-09-27 DIAGNOSIS — E6609 Other obesity due to excess calories: Secondary | ICD-10-CM | POA: Diagnosis not present

## 2021-09-27 DIAGNOSIS — F5104 Psychophysiologic insomnia: Secondary | ICD-10-CM | POA: Diagnosis not present

## 2021-09-27 DIAGNOSIS — E291 Testicular hypofunction: Secondary | ICD-10-CM | POA: Diagnosis not present

## 2021-09-28 DIAGNOSIS — R5383 Other fatigue: Secondary | ICD-10-CM | POA: Diagnosis not present

## 2021-09-28 DIAGNOSIS — E291 Testicular hypofunction: Secondary | ICD-10-CM | POA: Diagnosis not present

## 2021-12-25 DIAGNOSIS — Z23 Encounter for immunization: Secondary | ICD-10-CM | POA: Diagnosis not present

## 2021-12-26 ENCOUNTER — Other Ambulatory Visit (HOSPITAL_BASED_OUTPATIENT_CLINIC_OR_DEPARTMENT_OTHER): Payer: Self-pay

## 2021-12-26 ENCOUNTER — Encounter (HOSPITAL_BASED_OUTPATIENT_CLINIC_OR_DEPARTMENT_OTHER): Payer: Self-pay

## 2021-12-26 ENCOUNTER — Emergency Department (HOSPITAL_BASED_OUTPATIENT_CLINIC_OR_DEPARTMENT_OTHER): Payer: BC Managed Care – PPO

## 2021-12-26 ENCOUNTER — Other Ambulatory Visit: Payer: Self-pay

## 2021-12-26 ENCOUNTER — Emergency Department (HOSPITAL_BASED_OUTPATIENT_CLINIC_OR_DEPARTMENT_OTHER)
Admission: EM | Admit: 2021-12-26 | Discharge: 2021-12-26 | Disposition: A | Discharge status: Home / Self Care | Payer: BC Managed Care – PPO | Attending: Emergency Medicine | Admitting: Emergency Medicine

## 2021-12-26 DIAGNOSIS — K8020 Calculus of gallbladder without cholecystitis without obstruction: Secondary | ICD-10-CM | POA: Diagnosis not present

## 2021-12-26 DIAGNOSIS — N23 Unspecified renal colic: Secondary | ICD-10-CM

## 2021-12-26 DIAGNOSIS — N132 Hydronephrosis with renal and ureteral calculous obstruction: Secondary | ICD-10-CM | POA: Diagnosis not present

## 2021-12-26 DIAGNOSIS — N3289 Other specified disorders of bladder: Secondary | ICD-10-CM | POA: Diagnosis not present

## 2021-12-26 DIAGNOSIS — R109 Unspecified abdominal pain: Secondary | ICD-10-CM | POA: Diagnosis not present

## 2021-12-26 DIAGNOSIS — N200 Calculus of kidney: Secondary | ICD-10-CM | POA: Diagnosis not present

## 2021-12-26 LAB — CBC
HCT: 35.7 % — ABNORMAL LOW (ref 39.0–52.0)
Hemoglobin: 12.5 g/dL — ABNORMAL LOW (ref 13.0–17.0)
MCH: 31.2 pg (ref 26.0–34.0)
MCHC: 35 g/dL (ref 30.0–36.0)
MCV: 89 fL (ref 80.0–100.0)
Platelets: 166 10*3/uL (ref 150–400)
RBC: 4.01 MIL/uL — ABNORMAL LOW (ref 4.22–5.81)
RDW: 13.2 % (ref 11.5–15.5)
WBC: 12.4 10*3/uL — ABNORMAL HIGH (ref 4.0–10.5)
nRBC: 0 % (ref 0.0–0.2)

## 2021-12-26 LAB — BASIC METABOLIC PANEL
Anion gap: 10 (ref 5–15)
BUN: 18 mg/dL (ref 6–20)
CO2: 23 mmol/L (ref 22–32)
Calcium: 10.1 mg/dL (ref 8.9–10.3)
Chloride: 107 mmol/L (ref 98–111)
Creatinine, Ser: 1.68 mg/dL — ABNORMAL HIGH (ref 0.61–1.24)
GFR, Estimated: 49 mL/min — ABNORMAL LOW (ref >60)
Glucose, Bld: 145 mg/dL — ABNORMAL HIGH (ref 70–99)
Potassium: 4 mmol/L (ref 3.5–5.1)
Sodium: 140 mmol/L (ref 135–145)

## 2021-12-26 LAB — URINALYSIS, ROUTINE W REFLEX MICROSCOPIC
Bilirubin Urine: NEGATIVE
Glucose, UA: NEGATIVE mg/dL
Ketones, ur: NEGATIVE mg/dL
Leukocytes,Ua: NEGATIVE
Nitrite: NEGATIVE
Protein, ur: 30 mg/dL — AB
Specific Gravity, Urine: 1.034 — ABNORMAL HIGH (ref 1.005–1.030)
pH: 7 (ref 5.0–8.0)

## 2021-12-26 MED ORDER — OXYCODONE-ACETAMINOPHEN 5-325 MG PO TABS
1.0000 | ORAL_TABLET | Freq: Four times a day (QID) | ORAL | 0 refills | Status: DC | PRN
  Filled 2021-12-26: qty 20, 3d supply, fill #0

## 2021-12-26 MED ORDER — TAMSULOSIN HCL 0.4 MG PO CAPS
0.4000 mg | ORAL_CAPSULE | Freq: Every day | ORAL | 0 refills | Status: DC
  Filled 2021-12-26: qty 30, 30d supply, fill #0

## 2021-12-26 MED ORDER — ONDANSETRON HCL 4 MG/2ML IJ SOLN
4.0000 mg | Freq: Once | INTRAMUSCULAR | Status: DC
  Filled 2021-12-26: qty 2

## 2021-12-26 MED ORDER — MORPHINE SULFATE (PF) 4 MG/ML IV SOLN
8.0000 mg | Freq: Once | INTRAVENOUS | Status: AC
  Administered 2021-12-26: 8 mg via INTRAVENOUS
  Filled 2021-12-26: qty 2

## 2021-12-26 MED ORDER — ONDANSETRON HCL 4 MG/2ML IJ SOLN
4.0000 mg | Freq: Once | INTRAMUSCULAR | Status: AC
  Administered 2021-12-26: 4 mg via INTRAVENOUS
  Filled 2021-12-26: qty 2

## 2021-12-26 NOTE — ED Provider Notes (Signed)
[facility] EMERGENCY DEPT Provider Note   CSN: [id number] Arrival date & time: 12/26/21  0539     History  Chief Complaint  Patient presents with   Flank Pain   Emesis    Paul [name] is a 51 y.o. male.  51 year old male with history of renal colic presents right-sided flank pain consistent with his prior kidney stones.  States that the pain has been colicky and sharp.  Used Flomax and Percocet with limited relief.  Denies any hematuria.  No testicular pain or swelling.  No fever or chills.  Nothing makes symptoms better.      Home Medications Prior to Admission medications   Medication Sig Start Date End Date Taking? Authorizing Provider  acetaminophen (TYLENOL) 500 MG tablet Take 500 mg by mouth as needed for pain.    [provider]  lamoTRIgine (LAMICTAL) 100 MG tablet Take 1 tablet (100 mg total) by mouth 2 (two) times daily. 11/28/20   Marcial Pacas, MD  lamoTRIgine (LAMICTAL) 25 MG tablet 1 tab bid x one week 2 tab bid x 2nd week 3 tab bid x 3rd week 11/28/20   Marcial Pacas, MD      Allergies    Dilaudid [hydromorphone hcl] and Lunesta [eszopiclone]    Review of Systems   Review of Systems  All other systems reviewed and are negative.  Physical Exam Updated Vital Signs BP 116/65 (BP Location: Right Arm)   Pulse (!) 50   Temp 99 F (37.2 C) (Oral)   Resp 13   Ht 1.778 m (5' 10" )   Wt 99.8 kg   SpO2 100%   BMI 31.57 kg/m  Physical Exam Vitals and nursing note reviewed.  Constitutional:      General: He is not in acute distress.    Appearance: Normal appearance. He is well-developed. He is not toxic-appearing.  HENT:     Head: Normocephalic and atraumatic.  Eyes:     General: Lids are normal.     Conjunctiva/sclera: Conjunctivae normal.     Pupils: Pupils are equal, round, and reactive to light.  Neck:     Thyroid: No thyroid mass.     Trachea: No tracheal deviation.  Cardiovascular:     Rate and Rhythm: Normal rate and regular  rhythm.     Heart sounds: Normal heart sounds. No murmur heard.   No gallop.  Pulmonary:     Effort: Pulmonary effort is normal. No respiratory distress.     Breath sounds: Normal breath sounds. No stridor. No decreased breath sounds, wheezing, rhonchi or rales.  Abdominal:     General: There is no distension.     Palpations: Abdomen is soft.     Tenderness: There is no abdominal tenderness. There is no rebound.  Musculoskeletal:        General: No tenderness. Normal range of motion.     Cervical back: Normal range of motion and neck supple.  Skin:    General: Skin is warm and dry.     Findings: No abrasion or rash.  Neurological:     Mental Status: He is alert and oriented to person, place, and time. Mental status is at baseline.     GCS: GCS eye subscore is 4. GCS verbal subscore is 5. GCS motor subscore is 6.     Cranial Nerves: No cranial nerve deficit.     Sensory: No sensory deficit.     Motor: Motor function is intact.  Psychiatric:  Attention and Perception: Attention normal.        Speech: Speech normal.        Behavior: Behavior normal.    ED Results / Procedures / Treatments   Labs (all labs ordered are listed, but only abnormal results are displayed) Labs Reviewed  URINALYSIS, ROUTINE W REFLEX MICROSCOPIC  CBC  BASIC METABOLIC PANEL    EKG None  Radiology No results found.  Procedures Procedures    Medications Ordered in ED Medications  morphine (PF) 4 MG/ML injection 8 mg (has no administration in time range)  ondansetron (ZOFRAN) injection 4 mg (has no administration in time range)    ED Course/ Medical Decision Making/ A&P                           Medical Decision Making Amount and/or Complexity of Data Reviewed Labs: ordered. Radiology: ordered.  Risk Prescription drug management.   Patient with flank pain concern for kidney stone.  Urinalysis shows increased red blood cells.  Renal CT performed and per my interpretation shows  evidence of 4 mm kidney stone.  Patient was treated with 8 mg of morphine and does feel better.  Of note, his electrolytes do show an an increasing creatinine with decreasing GFR.  Patient denies any history of kidney disease.  Patient and his wife are encouraged to follow-up with her doctor for this.  Plan will be for patient to go home and follow-up with his urologist and will prescribe analgesics        Final Clinical Impression(s) / ED Diagnoses Final diagnoses:  None    Rx / DC Orders ED Discharge Orders     None         Lacretia Leigh, MD 12/26/21 1146

## 2021-12-26 NOTE — ED Notes (Signed)
Patient placed on 2 L [place] per O2 sat of 87-90% on RA after pain medication administration; bedside RN aware. Patient comfortable at this time.

## 2021-12-26 NOTE — ED Notes (Signed)
Patient transported to CT 

## 2021-12-26 NOTE — ED Triage Notes (Signed)
Pt reports right flank pain, nausea, and vomiting. Pt has hx of kidney stones. States symptoms began Sunday.

## 2021-12-26 NOTE — ED Notes (Signed)
Pt asked to provide a urine sample. Pt stated that he is unable to provide one at this time.

## 2021-12-26 NOTE — ED Notes (Signed)
Discharge instructions, follow up care, and prescriptions reviewed and explained, pt verbalized understanding. Pt caox4 on departure. Pt taken to wheelchair with wife.

## 2021-12-28 ENCOUNTER — Encounter (HOSPITAL_BASED_OUTPATIENT_CLINIC_OR_DEPARTMENT_OTHER): Payer: Self-pay | Admitting: Urology

## 2021-12-28 ENCOUNTER — Other Ambulatory Visit: Payer: Self-pay | Admitting: Urology

## 2021-12-28 DIAGNOSIS — N202 Calculus of kidney with calculus of ureter: Secondary | ICD-10-CM | POA: Diagnosis not present

## 2021-12-28 NOTE — Progress Notes (Signed)
Pre-procedure call completed.  Instructions given to patient and he verbalized understanding.  His wife will be his ride and caregiver.  Doreen Salvage [phone]).  Arrival time and location of [facility].  NPO after midnight.

## 2021-12-30 NOTE — H&P (Signed)
Office Visit Report     12/28/2021   --------------------------------------------------------------------------------   Paul [name]  MRN: [id number]  DOB: 10-07-1970, 51 year old Male  SSN: [id number]   PRIMARY CARE:  Deland Pretty, MD  REFERRING:    PROVIDER:  Wendie Simmer, M.D.  LOCATION:  [facility] [phone]     --------------------------------------------------------------------------------   CC/HPI: CC: Right ureteral calculus  HPI:  12/28/2021  Patient went to the emergency department on 12/26/2021. He presented with severe right-sided flank pain. Scan was performed that showed an obstructing 4 mm proximal right ureteral calculus with upstream hydroureteronephrosis. He had a nonobstructing 2 and 3 mm stone in the left kidney. He continues to have significant pain and discomfort. Almost out of Percocet. No longer having any nausea. Denies any fever, chills.     ALLERGIES: Dilaudid    MEDICATIONS: Percocet  Flomax     GU PSH: None   NON-GU PSH: Cholecystectomy (laparoscopic), 2014 Hernia Repair, As a child (58s)     GU PMH: Flank Pain - 2019 Renal calculus - 2019      PMH Notes:  1898-07-22 00:00:00 - Note: Normal Routine History And Physical Adult   NON-GU PMH: None   FAMILY HISTORY: 2 daughters - Other throat cancer - Father    Notes: 3 foster daughters   SOCIAL HISTORY: Marital Status: Married Preferred Language: English; Ethnicity: Not Hispanic Or Latino; Race: White Current Smoking Status: Patient has never smoked.   Tobacco Use Assessment Completed: Used Tobacco in last 30 days? Does drink.  Drinks 4+ caffeinated drinks per day. Patient's occupation Brewing technologist.    REVIEW OF SYSTEMS:    GU Review Male:   Patient reports trouble starting your stream and have to strain to urinate . Patient denies frequent urination, hard to postpone urination, burning/ pain with urination, get up at night to urinate, leakage of urine, stream  starts and stops, erection problems, and penile pain.  Gastrointestinal (Upper):   Patient reports nausea, vomiting, and indigestion/ heartburn.   Gastrointestinal (Lower):   Patient denies diarrhea and constipation.  Constitutional:   Patient reports fatigue. Patient denies fever, night sweats, and weight loss.  Skin:   Patient denies itching and skin rash/ lesion.  Eyes:   Patient denies blurred vision and double vision.  Ears/ Nose/ Throat:   Patient denies sore throat and sinus problems.  Hematologic/Lymphatic:   Patient denies swollen glands and easy bruising.  Cardiovascular:   Patient denies leg swelling and chest pains.  Respiratory:   Patient denies cough and shortness of breath.  Endocrine:   Patient denies excessive thirst.  Musculoskeletal:   Patient denies back pain and joint pain.  Neurological:   Patient reports headaches. Patient denies dizziness.  Psychologic:   Patient denies depression and anxiety.   Notes: weak stream    VITAL SIGNS:      12/28/2021 11:28 AM  Weight 220 lb / 99.79 kg  Height 70 in / 177.8 cm  BP 130/82 mmHg  Heart Rate 60 /min  Temperature 98.2 F / 36.7 C  BMI 31.6 kg/m   MULTI-SYSTEM PHYSICAL EXAMINATION:    Constitutional: Well-nourished. No physical deformities. Normally developed. Good grooming.  Respiratory: No labored breathing, no use of accessory muscles.   Cardiovascular: Normal temperature, normal extremity pulses, no swelling, no varicosities.  Skin: No paleness, no jaundice, no cyanosis. No lesion, no ulcer, no rash.  Neurologic / Psychiatric: Oriented to time, oriented to place, oriented to person. No depression,  no anxiety, no agitation.  Gastrointestinal: No mass, no tenderness, no rigidity, non obese abdomen.  Eyes: Normal conjunctivae. Normal eyelids.  Musculoskeletal: Normal gait and station of head and neck.     Complexity of Data:  Source Of History:  Patient  Lab Test Review:   BMP  Records Review:   Previous Doctor  Records, Previous Patient Records  Urine Test Review:   Urinalysis  X-Ray Review: C.T. Abdomen/Pelvis: Reviewed Films. Reviewed Report. Discussed With Patient.    Notes:                     Creatinine was 1.68 on the seventh. White blood cell count 12.   PROCEDURES:         KUB - [id number]  A single view of the abdomen is obtained.  4 mm stone in the mid ureter on the right      Patient confirmed No Neulasta OnPro Device.            Urinalysis w/Scope Dipstick Dipstick Cont'd Micro  Color: Amber Bilirubin: Neg mg/dL WBC/hpf: 0 - 5/hpf  Appearance: Clear Ketones: Neg mg/dL RBC/hpf: 0 - 2/hpf  Specific Gravity: 1.025 Blood: Neg ery/uL Bacteria: Few (10-25/hpf)  pH: 5.5 Protein: 1+ mg/dL Cystals: NS (Not Seen)  Glucose: Neg mg/dL Urobilinogen: 0.2 mg/dL Casts: Hyaline    Nitrites: Neg Trichomonas: Not Present    Leukocyte Esterase: Neg leu/uL Mucous: Present      Epithelial Cells: 0 - 5/hpf      Yeast: NS (Not Seen)      Sperm: Not Present         Ketoralac 48m - [id number]: 60 Adm. By: EAlcide Goodness Unit: mg Lot No [id number] Route: IM Exp. Date 09/20/2022  Freq: None Mfgr.:   Site: Left Hip   ASSESSMENT:      ICD-10 Details  1 GU:   Renal and ureteral calculus - N20.2 Undiagnosed New Problem  2   Ureteral obstruction secondary to calculous - N13.2 Undiagnosed New Problem   PLAN:            Medications New Meds: Ondansetron Odt 4 mg tablet,disintegrating 1 tablet PO Q 8 H PRN nausea  #20  1 Refill(s)  Oxycodone-Acetaminophen 5 mg-325 mg tablet 1 tablet PO Q 6 H PRN   #15  0 Refill(s)  Pharmacy Name[facility]  [id number]:  [address] [place] [address]Phone:  [phone]:  [phone]  Stop Meds: Ondansetron Odt 4 mg tablet,disintegrating 1 tablet PO Q 6 H PRN  Start: 09/19/2017  Discontinue: 12/28/2021  - Reason: The medication cycle was completed.            Orders Labs Urine Culture  X-Rays: KUB           Schedule         Document Letter(s):  Created for Patient: Clinical Summary         Notes:   We discussed the management of urinary stones. These options include observation, ureteroscopy, and shockwave lithotripsy. We discussed which options are relevant to these particular stones. We discussed the natural history of stones as well as the complications of untreated stones and the impact on quality of life without treatment as well as with each of the above listed treatments. We also discussed the efficacy of each treatment in its ability to clear the stone burden. With any of these management options I  discussed the signs and symptoms of infection and the need for emergent treatment should these be experienced. For each option we discussed the ability of each procedure to clear the patient of their stone burden.   For observation I described the risks which include but are not limited to silent renal damage, life-threatening infection, need for emergent surgery, failure to pass stone, and pain.   For ureteroscopy I described the risks which include heart attack, stroke, pulmonary embolus, death, bleeding, infection, damage to contiguous structures, positioning injury, ureteral stricture, ureteral avulsion, ureteral injury, need for ureteral stent, inability to perform ureteroscopy, need for an interval procedure, inability to clear stone burden, stent discomfort and pain.   For shockwave lithotripsy I described the risks which include arrhythmia, kidney contusion, kidney hemorrhage, need for transfusion, pain, inability to break up stone, inability to pass stone fragments, Steinstrasse, infection associated with obstructing stones, need for different surgical procedure, need for repeat shockwave lithotripsy.   He would like to proceed with ESWL. If he passes the stone over the weekend, he will give [place] a call. He will continue tamsulosin. Prescription for oxycodone sent. He will strain his urine.    CC: Dr. Shelia Media    * Signed by Link Snuffer, III, M.D. on 12/28/21 at 12:14 PM (EDT)*      The information contained in this medical record document is considered private and confidential patient information. This information can only be used for the medical diagnosis and/or medical services that are being provided by the patient's selected caregivers. This information can only be distributed outside of the patient's care if the patient agrees and signs waivers of authorization for this information to be sent to an outside source or route.

## 2021-12-31 ENCOUNTER — Ambulatory Visit (HOSPITAL_COMMUNITY): Payer: BC Managed Care – PPO

## 2021-12-31 ENCOUNTER — Encounter (HOSPITAL_BASED_OUTPATIENT_CLINIC_OR_DEPARTMENT_OTHER): Payer: Self-pay | Admitting: Urology

## 2021-12-31 ENCOUNTER — Encounter (HOSPITAL_BASED_OUTPATIENT_CLINIC_OR_DEPARTMENT_OTHER)
Admission: RE | Disposition: A | Discharge status: Home / Self Care | Payer: Self-pay | Source: Home / Self Care | Attending: Urology

## 2021-12-31 ENCOUNTER — Ambulatory Visit (HOSPITAL_BASED_OUTPATIENT_CLINIC_OR_DEPARTMENT_OTHER)
Admission: RE | Admit: 2021-12-31 | Discharge: 2021-12-31 | Disposition: A | Discharge status: Home / Self Care | Payer: BC Managed Care – PPO | Attending: Urology | Admitting: Urology

## 2021-12-31 DIAGNOSIS — Z01818 Encounter for other preprocedural examination: Secondary | ICD-10-CM | POA: Diagnosis not present

## 2021-12-31 DIAGNOSIS — N201 Calculus of ureter: Secondary | ICD-10-CM

## 2021-12-31 DIAGNOSIS — N200 Calculus of kidney: Secondary | ICD-10-CM | POA: Diagnosis not present

## 2021-12-31 DIAGNOSIS — N132 Hydronephrosis with renal and ureteral calculous obstruction: Secondary | ICD-10-CM | POA: Insufficient documentation

## 2021-12-31 HISTORY — DX: Personal history of urinary calculi: Z87.442

## 2021-12-31 HISTORY — PX: EXTRACORPOREAL SHOCK WAVE LITHOTRIPSY: SHX1557

## 2021-12-31 SURGERY — LITHOTRIPSY, ESWL
Anesthesia: LOCAL | Laterality: Right

## 2021-12-31 MED ORDER — DIPHENHYDRAMINE HCL 25 MG PO CAPS
ORAL_CAPSULE | ORAL | Status: AC
  Filled 2021-12-31: qty 1

## 2021-12-31 MED ORDER — CIPROFLOXACIN HCL 500 MG PO TABS
ORAL_TABLET | ORAL | Status: AC
  Filled 2021-12-31: qty 1

## 2021-12-31 MED ORDER — OXYCODONE-ACETAMINOPHEN 5-325 MG PO TABS
1.0000 | ORAL_TABLET | ORAL | Status: DC | PRN
  Administered 2021-12-31: 1 via ORAL

## 2021-12-31 MED ORDER — SODIUM CHLORIDE 0.9 % IV SOLN: INTRAVENOUS | Status: DC

## 2021-12-31 MED ORDER — CIPROFLOXACIN HCL 500 MG PO TABS
500.0000 mg | ORAL_TABLET | ORAL | Status: AC
  Administered 2021-12-31: 500 mg via ORAL

## 2021-12-31 MED ORDER — DIAZEPAM 5 MG PO TABS
10.0000 mg | ORAL_TABLET | ORAL | Status: AC
  Administered 2021-12-31: 10 mg via ORAL

## 2021-12-31 MED ORDER — DIAZEPAM 5 MG PO TABS
ORAL_TABLET | ORAL | Status: AC
  Filled 2021-12-31: qty 2

## 2021-12-31 MED ORDER — OXYCODONE-ACETAMINOPHEN 5-325 MG PO TABS
ORAL_TABLET | ORAL | Status: AC
  Filled 2021-12-31: qty 1

## 2021-12-31 MED ORDER — DIPHENHYDRAMINE HCL 25 MG PO CAPS
25.0000 mg | ORAL_CAPSULE | ORAL | Status: AC
  Administered 2021-12-31: 25 mg via ORAL

## 2021-12-31 NOTE — Op Note (Addendum)
Right 4 mm mid-ureteral stone  Right ESWL  Findings: on fluro we did confirm stone was at S4 on the right which correlates to today's pre-op KUB. The stone was targeted well and patient tolerated the procedure well. He may need a staged procedure if he fails to pass the stone or the fragments.

## 2021-12-31 NOTE — Interval H&P Note (Signed)
History and Physical Interval Note:  12/31/2021 8:00 AM  [name]  has presented today for surgery, with the diagnosis of RIGHT URETERAL STONE.  The various methods of treatment have been discussed with the patient and family. After consideration of risks, benefits and other options for treatment, the patient has consented to  Procedure(s): EXTRACORPOREAL SHOCK WAVE LITHOTRIPSY (ESWL) (Right) as a surgical intervention.  The patient's history has been reviewed, patient examined, no change in status, stable for surgery.  I have reviewed the patient's chart and labs. He had some right sided pain last night but none today. He is well no dysuria or gross hematuria. No fever. He has not been straining his urine. KUB with stone possible over right sacrum. Discussed he may have passed it and the nature r/b/a to proceeding with flouro eval today and IVP if needed. We also discussed continued stone passage and f/u in the office. He elects to proceed with flouro, IVP and ESWL if the stone is located.  Questions were answered to the patient's satisfaction.     Paul [name]

## 2021-12-31 NOTE — Discharge Instructions (Addendum)
No percocet until after 4:15pm as needed for pain.     Post Anesthesia Home Care Instructions  Activity: Get plenty of rest for the remainder of the day. A responsible individual must stay with you for 24 hours following the procedure.  For the next 24 hours, DO NOT: -Drive a car -Paediatric nurse -Drink alcoholic beverages -Take any medication unless instructed by your physician -Make any legal decisions or sign important papers.  Meals: Start with liquid foods such as gelatin or soup. Progress to regular foods as tolerated. Avoid greasy, spicy, heavy foods. If nausea and/or vomiting occur, drink only clear liquids until the nausea and/or vomiting subsides. Call your physician if vomiting continues.  Special Instructions/Symptoms: Your throat may feel dry or sore from the anesthesia or the breathing tube placed in your throat during surgery. If this causes discomfort, gargle with warm salt water. The discomfort should disappear within 24 hours.

## 2022-01-01 ENCOUNTER — Encounter (HOSPITAL_BASED_OUTPATIENT_CLINIC_OR_DEPARTMENT_OTHER): Payer: Self-pay | Admitting: Urology

## 2022-01-14 DIAGNOSIS — N202 Calculus of kidney with calculus of ureter: Secondary | ICD-10-CM | POA: Diagnosis not present

## 2022-02-02 ENCOUNTER — Encounter (HOSPITAL_COMMUNITY): Payer: Self-pay

## 2022-02-02 ENCOUNTER — Emergency Department (HOSPITAL_COMMUNITY)
Admission: EM | Admit: 2022-02-02 | Discharge: 2022-02-02 | Disposition: A | Discharge status: Home / Self Care | Payer: BC Managed Care – PPO | Attending: Emergency Medicine | Admitting: Emergency Medicine

## 2022-02-02 ENCOUNTER — Other Ambulatory Visit: Payer: Self-pay

## 2022-02-02 ENCOUNTER — Emergency Department (HOSPITAL_COMMUNITY): Payer: BC Managed Care – PPO

## 2022-02-02 DIAGNOSIS — R569 Unspecified convulsions: Secondary | ICD-10-CM | POA: Diagnosis not present

## 2022-02-02 DIAGNOSIS — S22080A Wedge compression fracture of T11-T12 vertebra, initial encounter for closed fracture: Secondary | ICD-10-CM | POA: Insufficient documentation

## 2022-02-02 DIAGNOSIS — N200 Calculus of kidney: Secondary | ICD-10-CM | POA: Diagnosis not present

## 2022-02-02 DIAGNOSIS — X58XXXA Exposure to other specified factors, initial encounter: Secondary | ICD-10-CM | POA: Insufficient documentation

## 2022-02-02 DIAGNOSIS — S0990XA Unspecified injury of head, initial encounter: Secondary | ICD-10-CM | POA: Diagnosis not present

## 2022-02-02 DIAGNOSIS — M4319 Spondylolisthesis, multiple sites in spine: Secondary | ICD-10-CM | POA: Diagnosis not present

## 2022-02-02 DIAGNOSIS — R9431 Abnormal electrocardiogram [ECG] [EKG]: Secondary | ICD-10-CM | POA: Diagnosis not present

## 2022-02-02 DIAGNOSIS — J329 Chronic sinusitis, unspecified: Secondary | ICD-10-CM | POA: Diagnosis not present

## 2022-02-02 DIAGNOSIS — J3489 Other specified disorders of nose and nasal sinuses: Secondary | ICD-10-CM | POA: Diagnosis not present

## 2022-02-02 DIAGNOSIS — S3992XA Unspecified injury of lower back, initial encounter: Secondary | ICD-10-CM | POA: Diagnosis not present

## 2022-02-02 DIAGNOSIS — I499 Cardiac arrhythmia, unspecified: Secondary | ICD-10-CM | POA: Diagnosis not present

## 2022-02-02 DIAGNOSIS — I10 Essential (primary) hypertension: Secondary | ICD-10-CM | POA: Diagnosis not present

## 2022-02-02 DIAGNOSIS — M4328 Fusion of spine, sacral and sacrococcygeal region: Secondary | ICD-10-CM | POA: Diagnosis not present

## 2022-02-02 LAB — URINALYSIS, ROUTINE W REFLEX MICROSCOPIC
Bacteria, UA: NONE SEEN
Bilirubin Urine: NEGATIVE
Glucose, UA: NEGATIVE mg/dL
Ketones, ur: NEGATIVE mg/dL
Leukocytes,Ua: NEGATIVE
Nitrite: NEGATIVE
Protein, ur: NEGATIVE mg/dL
Specific Gravity, Urine: 1.013 (ref 1.005–1.030)
pH: 5 (ref 5.0–8.0)

## 2022-02-02 LAB — COMPREHENSIVE METABOLIC PANEL
ALT: 30 U/L (ref 0–44)
AST: 26 U/L (ref 15–41)
Albumin: 4.6 g/dL (ref 3.5–5.0)
Alkaline Phosphatase: 41 U/L (ref 38–126)
Anion gap: 14 (ref 5–15)
BUN: 15 mg/dL (ref 6–20)
CO2: 21 mmol/L — ABNORMAL LOW (ref 22–32)
Calcium: 9.9 mg/dL (ref 8.9–10.3)
Chloride: 106 mmol/L (ref 98–111)
Creatinine, Ser: 1.27 mg/dL — ABNORMAL HIGH (ref 0.61–1.24)
GFR, Estimated: >60 mL/min (ref >60)
Glucose, Bld: 103 mg/dL — ABNORMAL HIGH (ref 70–99)
Potassium: 4.3 mmol/L (ref 3.5–5.1)
Sodium: 141 mmol/L (ref 135–145)
Total Bilirubin: 0.7 mg/dL (ref 0.3–1.2)
Total Protein: 6.7 g/dL (ref 6.5–8.1)

## 2022-02-02 LAB — CBC WITH DIFFERENTIAL/PLATELET
Abs Immature Granulocytes: 0.09 10*3/uL — ABNORMAL HIGH (ref 0.00–0.07)
Basophils Absolute: 0.1 10*3/uL (ref 0.0–0.1)
Basophils Relative: 1 %
Eosinophils Absolute: 0.1 10*3/uL (ref 0.0–0.5)
Eosinophils Relative: 2 %
HCT: 38.6 % — ABNORMAL LOW (ref 39.0–52.0)
Hemoglobin: 13.3 g/dL (ref 13.0–17.0)
Immature Granulocytes: 1 %
Lymphocytes Relative: 23 %
Lymphs Abs: 1.7 10*3/uL (ref 0.7–4.0)
MCH: 30.8 pg (ref 26.0–34.0)
MCHC: 34.5 g/dL (ref 30.0–36.0)
MCV: 89.4 fL (ref 80.0–100.0)
Monocytes Absolute: 0.5 10*3/uL (ref 0.1–1.0)
Monocytes Relative: 7 %
Neutro Abs: 4.7 10*3/uL (ref 1.7–7.7)
Neutrophils Relative %: 66 %
Platelets: 167 10*3/uL (ref 150–400)
RBC: 4.32 MIL/uL (ref 4.22–5.81)
RDW: 13.2 % (ref 11.5–15.5)
WBC: 7.2 10*3/uL (ref 4.0–10.5)
nRBC: 0 % (ref 0.0–0.2)

## 2022-02-02 LAB — ETHANOL: Alcohol, Ethyl (B): <10 mg/dL (ref <10)

## 2022-02-02 LAB — RAPID URINE DRUG SCREEN, HOSP PERFORMED
Amphetamines: NOT DETECTED
Barbiturates: NOT DETECTED
Benzodiazepines: NOT DETECTED
Cocaine: NOT DETECTED
Opiates: NOT DETECTED
Tetrahydrocannabinol: NOT DETECTED

## 2022-02-02 LAB — MAGNESIUM: Magnesium: 2.5 mg/dL — ABNORMAL HIGH (ref 1.7–2.4)

## 2022-02-02 MED ORDER — SODIUM CHLORIDE 0.9 % IV BOLUS
1000.0000 mL | Freq: Once | INTRAVENOUS | Status: AC
  Administered 2022-02-02: 1000 mL via INTRAVENOUS

## 2022-02-02 MED ORDER — IBUPROFEN 600 MG PO TABS: 600.0000 mg | ORAL_TABLET | Freq: Four times a day (QID) | ORAL | 0 refills | Status: DC | PRN

## 2022-02-02 MED ORDER — OXYCODONE HCL 5 MG PO TABS: 5.0000 mg | ORAL_TABLET | Freq: Four times a day (QID) | ORAL | 0 refills | Status: DC | PRN

## 2022-02-02 MED ORDER — SODIUM CHLORIDE 0.9 % IV SOLN
3000.0000 mg | Freq: Once | INTRAVENOUS | Status: AC
  Administered 2022-02-02: 3000 mg via INTRAVENOUS
  Filled 2022-02-02: qty 30

## 2022-02-02 MED ORDER — LEVETIRACETAM 500 MG PO TABS: 500.0000 mg | ORAL_TABLET | Freq: Two times a day (BID) | ORAL | 2 refills | Status: DC

## 2022-02-02 MED ORDER — ACETAMINOPHEN 325 MG PO TABS: 650.0000 mg | ORAL_TABLET | Freq: Four times a day (QID) | ORAL | 0 refills | Status: DC | PRN

## 2022-02-02 MED ORDER — KETOROLAC TROMETHAMINE 30 MG/ML IJ SOLN
30.0000 mg | Freq: Once | INTRAMUSCULAR | Status: AC
  Administered 2022-02-02: 30 mg via INTRAVENOUS
  Filled 2022-02-02: qty 1

## 2022-02-02 NOTE — Progress Notes (Signed)
Orthopedic Tech Progress Note Patient Details:  Paul [name] [birth year] [id number]  Ortho Devices Type of Ortho Device: Lumbar corsett Ortho Device/Splint Location: Back Ortho Device/Splint Interventions: Application   Post Interventions Patient Tolerated: Well Instructions Provided: Adjustment of device  Paul [name] 02/02/2022, 10:19 AM

## 2022-02-02 NOTE — ED Provider Notes (Signed)
[facility] EMERGENCY DEPARTMENT Provider Note   CSN: [id number] Arrival date & time: 02/02/22  8502     History  Chief Complaint  Patient presents with   Seizures    Paul [name] is a 51 y.o. male presenting to the ED with complaint of possible seizure.  Supplemental history is provided by his wife.  She reports that the patient woke her up from sleep this morning, as she heard him cry out, subsequently saw him having repeated clonic activity of the upper arms, not responding to her.  After about 60 seconds he went slack, and she reports that he was snoring heavily, and would not wake up to voice.  She called 911.  After about 10 minutes she reported that he was awake, but confused and groggy.  The patient now feels back to baseline.  He reports that he is sore all over, particularly in his lower back, and also his calves.  He denies that he is a regular alcohol drinker that he experiences alcohol withdrawal.  He denies any additional drug use.  He denies benzo use.  He denies personal or family history of seizure, although he did see a neurologist 1 year ago in May 2022 (Dr [name]) with concern for seizure-like activity.  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  However he did not have this work-up or imaging done per his report.  He was also recommended to start on Lamictal but ultimately decided not to start antiepileptic, as he states that there was some uncertainty whether this was truly a seizure.    HPI     Home Medications Prior to Admission medications   Medication Sig Start Date End Date Taking? Authorizing Provider  acetaminophen (TYLENOL) 325 MG tablet Take 2 tablets (650 mg total) by mouth every 6 (six) hours as needed for up to 30 doses. 02/02/22  Yes Pavneet Markwood, Kermit Balo, MD  ibuprofen (ADVIL) 600 MG tablet Take 1 tablet (600 mg total) by mouth every 6 (six) hours as needed for up to 30 doses  for mild pain or moderate pain. 02/02/22  Yes Terald Sleeper, MD  levETIRAcetam (KEPPRA) 500 MG tablet Take 1 tablet (500 mg total) by mouth 2 (two) times daily. 02/02/22 03/04/22 Yes Aliani Caccavale, Kermit Balo, MD  oxyCODONE (ROXICODONE) 5 MG immediate release tablet Take 1 tablet (5 mg total) by mouth every 6 (six) hours as needed for up to 10 doses for severe pain. 02/02/22  Yes Anjani Feuerborn, Kermit Balo, MD  acetaminophen (TYLENOL) 500 MG tablet Take 500 mg by mouth as needed for pain.    [provider]  oxyCODONE-acetaminophen (PERCOCET/ROXICET) 5-325 MG tablet Take 1-2 tablets by mouth every 6 (six) hours as needed for severe pain. 12/26/21   Lorre Nick, MD  tamsulosin (FLOMAX) 0.4 MG CAPS capsule Take 1 capsule (0.4 mg total) by mouth daily. 12/26/21   Lorre Nick, MD      Allergies    Dilaudid [place] hcl] and Lunesta [eszopiclone]    Review of Systems   Review of Systems  Physical Exam Updated Vital Signs BP (!) 100/54   Pulse 64   Temp 98.9 F (37.2 C) (Oral)   Resp 15   Ht 5\' 10"  (1.778 m)   Wt 97.5 kg   SpO2 96%   BMI 30.85 kg/m  Physical Exam Constitutional:      General: He is not in acute distress.  HENT:     Head: Normocephalic and atraumatic.  Eyes:     Conjunctiva/sclera: Conjunctivae normal.     Pupils: Pupils are equal, round, and reactive to light.  Cardiovascular:     Rate and Rhythm: Normal rate and regular rhythm.  Pulmonary:     Effort: Pulmonary effort is normal. No respiratory distress.  Abdominal:     General: There is no distension.     Tenderness: There is no abdominal tenderness.  Musculoskeletal:     Comments: Lumbar paraspinal tenderness and spinal midline tenderness of the lumbar region  Skin:    General: Skin is warm and dry.  Neurological:     General: No focal deficit present.     Mental Status: He is alert. Mental status is at baseline.  Psychiatric:        Mood and Affect: Mood normal.        Behavior: Behavior normal.      ED Results / Procedures / Treatments   Labs (all labs ordered are listed, but only abnormal results are displayed) Labs Reviewed  COMPREHENSIVE METABOLIC PANEL - Abnormal; Notable for the following components:      Result Value   CO2 21 (*)    Glucose, Bld 103 (*)    Creatinine, Ser 1.27 (*)    All other components within normal limits  CBC WITH DIFFERENTIAL/PLATELET - Abnormal; Notable for the following components:   HCT 38.6 (*)    Abs Immature Granulocytes 0.09 (*)    All other components within normal limits  MAGNESIUM - Abnormal; Notable for the following components:   Magnesium 2.5 (*)    All other components within normal limits  URINALYSIS, ROUTINE W REFLEX MICROSCOPIC - Abnormal; Notable for the following components:   Hgb urine dipstick SMALL (*)    All other components within normal limits  RAPID URINE DRUG SCREEN, HOSP PERFORMED  ETHANOL  CBG MONITORING, ED    EKG EKG Interpretation  Date/Time:  Saturday February 02 2022 07:05:45 EDT Ventricular Rate:  80 PR Interval:  150 QRS Duration: 100 QT Interval:  367 QTC Calculation: 424 R Axis:   12 Text Interpretation: Sinus rhythm Low voltage, precordial leads Confirmed by Alvester Chou [phone]) on 02/02/2022 7:17:42 AM  Radiology CT HEAD WO CONTRAST  Result Date: 02/02/2022 CLINICAL DATA:  51 year old male with possible seizure and trauma. EXAM: CT HEAD WITHOUT CONTRAST TECHNIQUE: Contiguous axial images were obtained from the base of the skull through the vertex without intravenous contrast. RADIATION DOSE REDUCTION: This exam was performed according to the departmental dose-optimization program which includes automated exposure control, adjustment of the mA and/or kV according to patient size and/or use of iterative reconstruction technique. COMPARISON:  None Available. FINDINGS: Brain: No midline shift, ventriculomegaly, mass effect, evidence of mass lesion, intracranial hemorrhage or evidence of cortically based  acute infarction. Gray-white matter differentiation is within normal limits throughout the brain. Cerebral volume is within normal limits for age. Vascular: No suspicious intracranial vascular hyperdensity. Skull: Negative. Sinuses/Orbits: Mild paranasal sinus mucosal thickening, trace cell outside of the left frontal sinus and frontoethmoidal recess. No sinus fluid levels. Tympanic cavities and mastoids are clear. Other: No orbit or scalp soft tissue injury identified. IMPRESSION: 1. Normal for age non contrast CT appearance of the brain. 2. Mild paranasal sinus inflammation. Electronically Signed   By: Odessa Fleming M.D.   On: 02/02/2022 08:36   CT Lumbar Spine Wo Contrast  Result Date: 02/02/2022 CLINICAL DATA:  51 year old male with possible seizure and  trauma. EXAM: CT LUMBAR SPINE WITHOUT CONTRAST TECHNIQUE: Multidetector CT imaging of the lumbar spine was performed without intravenous contrast administration. Multiplanar CT image reconstructions were also generated. RADIATION DOSE REDUCTION: This exam was performed according to the departmental dose-optimization program which includes automated exposure control, adjustment of the mA and/or kV according to patient size and/or use of iterative reconstruction technique. COMPARISON:  CT Abdomen and Pelvis 01/15/2022. FINDINGS: Segmentation: Normal. Alignment: Stable straightening of lumbar lordosis, grade 1 anterolisthesis of L5 on S1. Vertebrae: Chronic L5 pars fractures redemonstrated. Stable lumbar vertebral height and alignment. However, T12 superior endplate compression with 20% loss of vertebral body height is new since last month. Only minimal retropulsion of the posterosuperior endplate. T12 pedicles and posterior elements appear intact. Adjacent T11 vertebra appears grossly intact. No visible posterior rib fracture. 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 A small round area of osteopenia in the medial right iliac bone on  series 4, image 134 is unchanged. Paraspinal and other soft tissues: Left nephrolithiasis. Faint calcified aortic atherosclerosis. Absent gallbladder. Right hydronephrosis and hydroureter have resolved since last month. There is minimal soft tissue edema adjacent to the compressed T12 body. Lumbar paraspinal soft tissues remain normal. Disc levels: Mild for age lumbar spine degeneration except for the chronic L5-S1 spondylolysis and spondylolisthesis. Chronic vacuum disc there. IMPRESSION: 1. Acute T12 superior endplate compression fracture with 20% loss of vertebral body height, new since last month. No significant retropulsion or other complicating features. 2. Stable CT appearance of the lumbar spine. 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 3. Resolved right side obstructive uropathy.  Left nephrolithiasis. Electronically Signed   By: Genevie Ann M.D.   On: 02/02/2022 08:34    Procedures Procedures    Medications Ordered in ED Medications  sodium chloride 0.9 % bolus 1,000 mL (0 mLs Intravenous Stopped 02/02/22 0854)  ketorolac (TORADOL) 30 MG/ML injection 30 mg (30 mg Intravenous Given 02/02/22 0755)  levETIRAcetam (KEPPRA) 3,000 mg in sodium chloride 0.9 % 250 mL IVPB (0 mg Intravenous Stopped 02/02/22 1043)    ED Course/ Medical Decision Making/ A&P Clinical Course as of 02/02/22 1158  Sat Feb 02, 2022  0920 I spoke to Dr Rory Percy from neurology who agrees that his clinical presentation be concerning for seizure, he recommends a 3 g IV Keppra loading dose in the ED today, then starting the patient on 500 mg twice daily Keppra, and follow-up again with the neurologist in the office. [MT]  [phone] Patient will also need a TLSO brace for his T-spine compression fracture. [MT]    Clinical Course User Index [MT] Enyla Lisbon, Carola Rhine, MD                           Medical Decision Making Amount and/or Complexity of Data Reviewed Labs: ordered. Radiology:  ordered.  Risk OTC drugs. Prescription drug management.   This patient presents to the ED with concern for suspected seizure-like activity.  The clinical description provided by his family member is highly consistent with a tonic-clonic seizure, and postictal state.  Patient is now back to baseline mental status.  History is not consistent with status epilepticus.  For medical record review and review of external records, neurology note from last year, it does appear that he may have had a seizure in the past as well.  Additional history obtained from patient's wife at bedside  I ordered and personally interpreted labs.  The  pertinent results include:  no significant emergent abnormalities  I ordered imaging studies including CT imaging of the brain and lumbar spine I independently visualized and interpreted imaging which showed T12 compression fx, no ICH or lesions I agree with the radiologist interpretation  The patient was maintained on a cardiac monitor.  I personally viewed and interpreted the cardiac monitored which showed an underlying rhythm of: Normal sinus  Per my interpretation the patient's ECG shows normal sinus rhythm with no acute ischemic findings or evidence of arrhythmia.  I ordered medication including IV Toradol and IV fluid bolus for muscle cramping and pain  I have reviewed the patients home medicines and have made adjustments as needed  Test Considered: doubt meningitis - did not feel LP indicated at this time   After the interventions noted above, I reevaluated the patient and found that they have: improved  Patient fitted with TLSO brace Advised not to drive 6 months minimum - & need for f/u with neurology  Patient and wife verbalized understanding  Dispostion:  After consideration of the diagnostic results and the patients response to treatment, I feel that the patent would benefit from outpatient neurology and NSGY spine clinic  f/u.         Final Clinical Impression(s) / ED Diagnoses Final diagnoses:  Seizure ([place])  Compression fracture of T12 vertebra, initial encounter ([place])    Rx / DC Orders ED Discharge Orders          Ordered    Ambulatory referral to Neurology       Comments: An appointment is requested in approximately: 1 week New seizure diagnosis - patient seen by Dr Krista Blue in [facility], but has requested alternative group/provider   02/02/22 1151    oxyCODONE (ROXICODONE) 5 MG immediate release tablet  Every 6 hours PRN        02/02/22 1154    levETIRAcetam (KEPPRA) 500 MG tablet  2 times daily        02/02/22 1154    ibuprofen (ADVIL) 600 MG tablet  Every 6 hours PRN        02/02/22 1154    acetaminophen (TYLENOL) 325 MG tablet  Every 6 hours PRN        02/02/22 1154              Wyvonnia Dusky, MD 02/02/22 1159

## 2022-02-02 NOTE — Discharge Instructions (Addendum)
You should not drive a vehicle for at least 6 months from the time of your last seizure, which starts today.  You will need to follow-up with a neurologist in the office.  You can contact your own neurologist, or if you prefer a different provider, referral has been placed to alternative group.  You will need to begin taking Keppra as an antiseizure medication, beginning tomorrow morning.  Take this as prescribed, twice daily.  *  You requested information and referral to an alternative neurology group.  I placed a referral to [facility].  They should contact you within 3 business days.  It is important that you are seen within a month in the office, preferably within a week or 2 if this is possible.  If this cannot be done with a new group, I would recommend returning to your prior neurologist for the first follow up visit.  *  You will need to follow up with the spine clinic for your T12 thoracic spine compression fracture.

## 2022-02-02 NOTE — ED Triage Notes (Signed)
Pt bib [facility] from home after wife reported witnessed full body seizure lasting approx. 1 min. Pt postictal for about 10 minutes. No hx seizures. Reports back pain and calf pain  BP 118/78 HR 80 sinus arhythmia SPO2 96% RA CBG 100

## 2022-02-02 NOTE — ED Notes (Signed)
Patient transported to CT 

## 2022-02-05 ENCOUNTER — Encounter: Payer: Self-pay | Admitting: Neurology

## 2022-02-07 ENCOUNTER — Ambulatory Visit (INDEPENDENT_AMBULATORY_CARE_PROVIDER_SITE_OTHER): Payer: BC Managed Care – PPO | Admitting: Neurology

## 2022-02-07 ENCOUNTER — Encounter: Payer: Self-pay | Admitting: Neurology

## 2022-02-07 VITALS — BP 133/92 | HR 73 | Ht 70.0 in | Wt 225.0 lb

## 2022-02-07 DIAGNOSIS — R569 Unspecified convulsions: Secondary | ICD-10-CM | POA: Diagnosis not present

## 2022-02-07 DIAGNOSIS — S22009A Unspecified fracture of unspecified thoracic vertebra, initial encounter for closed fracture: Secondary | ICD-10-CM

## 2022-02-07 MED ORDER — LEVETIRACETAM 500 MG PO TABS: ORAL_TABLET | ORAL | 11 refills | Status: DC

## 2022-02-07 NOTE — Patient Instructions (Signed)
Good to meet you.  Schedule open MRI brain with and without contrast  2. Schedule routine EEG  3. Referral will be sent to [facility] for T-spine fracture  4. Continue Keppra (Levetiracetam) 500mg  twice a day  5. Follow-up in 3 months, call for any changes   Seizure Precautions: 1. If medication has been prescribed for you to prevent seizures, take it exactly as directed.  Do not stop taking the medicine without talking to your doctor first, even if you have not had a seizure in a long time.   2. Avoid activities in which a seizure would cause danger to yourself or to others.  Don't operate dangerous machinery, swim alone, or climb in high or dangerous places, such as on ladders, roofs, or girders.  Do not drive unless your doctor says you may.  3. If you have any warning that you may have a seizure, lay down in a safe place where you can't hurt yourself.    4.  No driving for 6 months from last seizure, as per [facility].   Please refer to the following link on the [employer]ile[employer] of [employer]'s website for more information: http://www.epilepsyfoundation.org/answerplace/Social/driving/drivingu.cfm   5.  Maintain good sleep hygiene. Avoid alcohol.  6.  Contact your doctor if you have any problems that may be related to the medicine you are taking.  7.  Call 911 and bring the patient back to the ED if:        A.  The seizure lasts longer than 5 minutes.       B.  The patient doesn't awaken shortly after the seizure  C.  The patient has new problems such as difficulty seeing, speaking or moving  D.  The patient was injured during the seizure  E.  The patient has a temperature over 102 F (39C)  F.  The patient vomited and now is having trouble breathing

## 2022-02-07 NOTE — Progress Notes (Signed)
NEUROLOGY CONSULTATION NOTE  Paul [name] MRN: [id number] DOB: [birth year]  Referring provider: Dr. Alvester Chou (ER) Primary care provider: Dr. Merri Brunette  Reason for consult:  seizures  Dear Dr Renaye Rakers:  Thank you for your kind referral of Paul [name] for consultation of the above symptoms. Although his history is well known to you, please allow me to reiterate it for the purpose of our medical record. The patient was accompanied to the clinic by his brother-in-law [name] who also provides collateral information. Records and images were personally reviewed where available.   HISTORY OF PRESENT ILLNESS: This is  51 year old right-handed man with a history of ureteral stone, presenting for evaluation of seizures. He was in the ER on 02/02/2022 after a nocturnal seizure witnessed by his wife. He felt fine the night prior and went to bed at 1:30am, then at 5:30am his wife heard his call out and saw his arms moving up and down. She could not wake him up, then after 60 seconds he went slack with sonorous respirations. He woke up to EMS around him, his wife reported he woke up after 10 minutes but was confused and groggy. He recalls being able to answer some questions but not all correctly, no tongue bite, incontinence, or focal weakness. He felt sore in his back and calves. Bloodwork showed a creatinine of 1.27, EtOH and UDS negative. I personally reviewed head CT without contrast which did not show any acute changes. A CT lumbar spine without contrast showed an acute T12 superior endplate compression fracture with 20% loss of vertebral body height. He was discharged home on Levetiracetam 500mg  BID.   He was evaluated by neurologist Dr. in May 2022 after he had an episode in February 2022 while fixing his daughter's hair, he suddenly felt cloudy, dizzy, and tried to find a place to rest, when he suddenly fell to the floor on his right side and his wife noticed facial twitching. He was out  briefly and took 30 minutes to clear up. Around 2 weeks later, he was at work talking on the phone when he felt an abnormal sensation, he was able to understand the conversation but had trouble finding and pronouncing his words for 3 minutes. There was no associated headache or any other symptoms, no loss of consciousness. Dr. March 2022 ordered an MRI and EEG, and prescribed Lamotrigine, however he did not start it and did not complete the studies. He denies any similar episodes since then. He and his brother-in-law deny any staring/unresponsive episodes, gaps in time, olfactory/gustatory hallucinations, deja vu, rising epigastric sensation, focal numbness/tingling/weakness, myoclonic jerks. Since the seizure, [name] has noticed he is slower to formulate his words, it is not very noticeable, but [name] notes he is more deliberate. He denies any headaches, dizziness, vision changes, dysarthria/dysphagia, neck pain, bowel/bladder dysfunction. He has no prior history of back pain, but since the seizure, his back has been bothering him, he was given Roxicodone in the ER. He has been referred to [name] and is awaiting an appointment. He has never been a good sleeper, usually getting 5 hours of sleep, but had no recent change in sleep pattern. He has prn Zaleplon. He drinks 1-2 alcohol beverages a week and had a beer the night prior to the seizure. Mood is pretty good, he may be a little more irritable. Memory is okay. He works in [place].    Epilepsy Risk Factors:  He had a normal birth and early development.  There is no history of febrile convulsions, CNS infections such as meningitis/encephalitis, significant traumatic brain injury, neurosurgical procedures, or family history of seizures.   PAST MEDICAL HISTORY: Past Medical History:  Diagnosis Date   Abdominal pain    Cholelithiasis    History of kidney stones    Insomnia    Syncope     PAST SURGICAL HISTORY: Past Surgical History:  Procedure  Laterality Date   CHOLECYSTECTOMY N/A 11/20/2012   Procedure: LAPAROSCOPIC CHOLECYSTECTOMY converted to open cholecystectomy with  CHOLANGIOGRAM;  Surgeon: Adolph Pollack, MD;  Location: [facility];  Service: General;  Laterality: N/A;   ERCP N/A 11/22/2012   Procedure: ENDOSCOPIC RETROGRADE CHOLANGIOPANCREATOGRAPHY (ERCP);  Surgeon: Petra Kuba, MD;  Location: [facility];  Service: Endoscopy;  Laterality: N/A;   ERCP N/A 04/21/2013   Procedure: ENDOSCOPIC RETROGRADE CHOLANGIOPANCREATOGRAPHY (ERCP);  Surgeon: Petra Kuba, MD;  Location: [facility];  Service: Endoscopy;  Laterality: N/A;   ESOPHAGOGASTRODUODENOSCOPY N/A 01/01/2013   Procedure: ESOPHAGOGASTRODUODENOSCOPY (EGD);  Surgeon: Petra Kuba, MD;  Location: [facility];  Service: Endoscopy;  Laterality: N/A;   EXTRACORPOREAL SHOCK WAVE LITHOTRIPSY Right 12/31/2021   Procedure: EXTRACORPOREAL SHOCK WAVE LITHOTRIPSY (ESWL);  Surgeon: Jerilee Field, MD;  Location: [facility];  Service: Urology;  Laterality: Right;   HERNIA REPAIR     Childhood./ inguinal   WISDOM TOOTH EXTRACTION      MEDICATIONS: Current Outpatient Medications on File Prior to Visit  Medication Sig Dispense Refill   acetaminophen (TYLENOL) 325 MG tablet Take 2 tablets (650 mg total) by mouth every 6 (six) hours as needed for up to 30 doses for moderate pain or mild pain. 30 tablet 0   acetaminophen (TYLENOL) 500 MG tablet Take 500 mg by mouth as needed for pain.     levETIRAcetam (KEPPRA) 500 MG tablet Take 1 tablet (500 mg total) by mouth 2 (two) times daily. 60 tablet 2   oxyCODONE (ROXICODONE) 5 MG immediate release tablet Take 1 tablet (5 mg total) by mouth every 6 (six) hours as needed for up to 10 doses for severe pain. 10 tablet 0   ibuprofen (ADVIL) 600 MG tablet Take 1 tablet (600 mg total) by mouth every 6 (six) hours as needed for up to 30 doses for mild pain or moderate pain. (Patient not taking: Reported on 02/07/2022) 30 tablet 0    oxyCODONE-acetaminophen (PERCOCET/ROXICET) 5-325 MG tablet Take 1-2 tablets by mouth every 6 (six) hours as needed for severe pain. (Patient not taking: Reported on 02/07/2022) 20 tablet 0   tamsulosin (FLOMAX) 0.4 MG CAPS capsule Take 1 capsule (0.4 mg total) by mouth daily. (Patient not taking: Reported on 02/07/2022) 30 capsule 0   No current facility-administered medications on file prior to visit.    ALLERGIES: Allergies  Allergen Reactions   Dilaudid [Hydromorphone Hcl] Anaphylaxis    Appears to tolerate morphine and oxycodone.   Lunesta [Eszopiclone] Other (See Comments)    Bad taste    FAMILY HISTORY: Family History  Problem Relation Age of Onset   Healthy Mother    Lung cancer Father     SOCIAL HISTORY: Social History   Socioeconomic History   Marital status: Married    Spouse name: Not on file   Number of children: 3   Years of education: college   Highest education level: Not on file  Occupational History   Occupation: sales  Tobacco Use   Smoking status: Never   Smokeless tobacco: Never  Substance and Sexual Activity  Alcohol use: Yes    Comment: 1-2 drinks/week all of the above   Drug use: Never   Sexual activity: Never  Other Topics Concern   Not on file  Social History Narrative   Lives at home with family.two story home   Right-handed.   Caffeine use: 2 cups per day.   Social Determinants of Health   Financial Resource Strain: Not on file  Food Insecurity: Not on file  Transportation Needs: Not on file  Physical Activity: Not on file  Stress: Not on file  Social Connections: Not on file  Intimate Partner Violence: Not on file     PHYSICAL EXAM: Vitals:   02/07/22 1026  BP: (!) 133/92  Pulse: 73  SpO2: 95%   General: No acute distress Head:  Normocephalic/atraumatic Skin/Extremities: No rash, no edema. Wearing back brace Neurological Exam: Mental status: alert and oriented to person, place, and time, no dysarthria or aphasia, Fund  of knowledge is appropriate.  Recent and remote memory are intact, 3/3 delayed recall.  Attention and concentration are normal, 5/5 WORLD backwards. Able to name objects and repeat phrases. Cranial nerves: CN I: not tested CN II: pupils equal, round and reactive to light, visual fields intact CN III, IV, VI:  full range of motion, no nystagmus, no ptosis CN V: facial sensation intact CN VII: upper and lower face symmetric CN VIII: hearing intact to conversation Bulk & Tone: normal, no fasciculations. Motor: 5/5 throughout with no pronator drift. Sensation: intact to light touch, cold, pin, vibration sense.  No extinction to double simultaneous stimulation.  Romberg test negative Deep Tendon Reflexes: +1 throughout Cerebellar: no incoordination on finger to nose testing Gait: narrow-based and steady, no ataxia Tremor: none   IMPRESSION: This is  51 year old right-handed man with a history of ureteral stone, presenting for evaluation of seizures. He had a nocturnal seizure on 02/02/2022, however had an episode of loss of consciousness with facial twitching in February 2022 and a transient episode of word-finding difficulties in March 2022, concerning for focal to bilateral tonic-clonic seizures. MRI brain with and without contrast and routine EEG will be ordered as part of seizure work-up. He is now on Levetiracetam 500mg  BID, side effects discussed, refills sent. We discussed avoidance of seizure triggers, including missing medication, sleep deprivation, and alcohol. [place] driving laws were discussed with the patient, and he knows to stop driving after a seizure, until 6 months seizure-free. He has not heard back yet from Neurosurgery for follow-up on T-spine fracture, referral will be sent today. Follow-up in 3 months, call for any changes.    Thank you for allowing me to participate in the care of this patient. Please do not hesitate to call for any questions or concerns.   [place], M.D.  CC: Dr. Patrcia Dolly, Dr. Renaye Rakers

## 2022-02-14 DIAGNOSIS — S22080A Wedge compression fracture of T11-T12 vertebra, initial encounter for closed fracture: Secondary | ICD-10-CM | POA: Diagnosis not present

## 2022-02-22 ENCOUNTER — Ambulatory Visit: Payer: BC Managed Care – PPO | Admitting: Neurology

## 2022-03-04 DIAGNOSIS — R569 Unspecified convulsions: Secondary | ICD-10-CM | POA: Diagnosis not present

## 2022-03-04 DIAGNOSIS — R9089 Other abnormal findings on diagnostic imaging of central nervous system: Secondary | ICD-10-CM | POA: Diagnosis not present

## 2022-03-05 ENCOUNTER — Ambulatory Visit (HOSPITAL_COMMUNITY)
Admission: RE | Admit: 2022-03-05 | Discharge: 2022-03-05 | Disposition: A | Discharge status: Home / Self Care | Payer: BC Managed Care – PPO | Source: Ambulatory Visit | Attending: Neurology | Admitting: Neurology

## 2022-03-05 DIAGNOSIS — R569 Unspecified convulsions: Secondary | ICD-10-CM | POA: Diagnosis not present

## 2022-03-05 NOTE — Progress Notes (Addendum)
EEG complete - results pending 

## 2022-03-07 ENCOUNTER — Emergency Department (HOSPITAL_COMMUNITY)
Admission: EM | Admit: 2022-03-07 | Discharge: 2022-03-07 | Disposition: A | Discharge status: Home / Self Care | Payer: BC Managed Care – PPO | Attending: Emergency Medicine | Admitting: Emergency Medicine

## 2022-03-07 ENCOUNTER — Telehealth: Payer: Self-pay

## 2022-03-07 ENCOUNTER — Other Ambulatory Visit: Payer: Self-pay

## 2022-03-07 ENCOUNTER — Emergency Department (HOSPITAL_COMMUNITY): Payer: BC Managed Care – PPO

## 2022-03-07 ENCOUNTER — Other Ambulatory Visit (HOSPITAL_COMMUNITY): Payer: Self-pay

## 2022-03-07 DIAGNOSIS — R9389 Abnormal findings on diagnostic imaging of other specified body structures: Secondary | ICD-10-CM | POA: Insufficient documentation

## 2022-03-07 DIAGNOSIS — I639 Cerebral infarction, unspecified: Secondary | ICD-10-CM | POA: Diagnosis not present

## 2022-03-07 DIAGNOSIS — Z9289 Personal history of other medical treatment: Secondary | ICD-10-CM

## 2022-03-07 DIAGNOSIS — G08 Intracranial and intraspinal phlebitis and thrombophlebitis: Secondary | ICD-10-CM

## 2022-03-07 DIAGNOSIS — Z87898 Personal history of other specified conditions: Secondary | ICD-10-CM

## 2022-03-07 DIAGNOSIS — R569 Unspecified convulsions: Secondary | ICD-10-CM | POA: Diagnosis not present

## 2022-03-07 LAB — CBC WITH DIFFERENTIAL/PLATELET
Abs Immature Granulocytes: 0.02 10*3/uL (ref 0.00–0.07)
Basophils Absolute: 0 10*3/uL (ref 0.0–0.1)
Basophils Relative: 1 %
Eosinophils Absolute: 0.1 10*3/uL (ref 0.0–0.5)
Eosinophils Relative: 1 %
HCT: 41.9 % (ref 39.0–52.0)
Hemoglobin: 14.3 g/dL (ref 13.0–17.0)
Immature Granulocytes: 0 %
Lymphocytes Relative: 26 %
Lymphs Abs: 1.8 10*3/uL (ref 0.7–4.0)
MCH: 31.3 pg (ref 26.0–34.0)
MCHC: 34.1 g/dL (ref 30.0–36.0)
MCV: 91.7 fL (ref 80.0–100.0)
Monocytes Absolute: 0.6 10*3/uL (ref 0.1–1.0)
Monocytes Relative: 9 %
Neutro Abs: 4.3 10*3/uL (ref 1.7–7.7)
Neutrophils Relative %: 63 %
Platelets: 178 10*3/uL (ref 150–400)
RBC: 4.57 MIL/uL (ref 4.22–5.81)
RDW: 13.9 % (ref 11.5–15.5)
WBC: 6.7 10*3/uL (ref 4.0–10.5)
nRBC: 0 % (ref 0.0–0.2)

## 2022-03-07 LAB — SEDIMENTATION RATE: Sed Rate: 2 mm/hr (ref 0–16)

## 2022-03-07 LAB — COMPREHENSIVE METABOLIC PANEL
ALT: 46 U/L — ABNORMAL HIGH (ref 0–44)
AST: 33 U/L (ref 15–41)
Albumin: 5.2 g/dL — ABNORMAL HIGH (ref 3.5–5.0)
Alkaline Phosphatase: 50 U/L (ref 38–126)
Anion gap: 11 (ref 5–15)
BUN: 13 mg/dL (ref 6–20)
CO2: 21 mmol/L — ABNORMAL LOW (ref 22–32)
Calcium: 9.9 mg/dL (ref 8.9–10.3)
Chloride: 106 mmol/L (ref 98–111)
Creatinine, Ser: 1.22 mg/dL (ref 0.61–1.24)
GFR, Estimated: >60 mL/min (ref >60)
Glucose, Bld: 88 mg/dL (ref 70–99)
Potassium: 3.7 mmol/L (ref 3.5–5.1)
Sodium: 138 mmol/L (ref 135–145)
Total Bilirubin: 1 mg/dL (ref 0.3–1.2)
Total Protein: 7.5 g/dL (ref 6.5–8.1)

## 2022-03-07 LAB — PROTIME-INR
INR: 1 (ref 0.8–1.2)
Prothrombin Time: 13.4 seconds (ref 11.4–15.2)

## 2022-03-07 LAB — C-REACTIVE PROTEIN: CRP: 0.5 mg/dL (ref <1.0)

## 2022-03-07 LAB — ANTITHROMBIN III: AntiThromb III Func: 126 % — ABNORMAL HIGH (ref 75–120)

## 2022-03-07 LAB — APTT: aPTT: 30 seconds (ref 24–36)

## 2022-03-07 MED ORDER — IOHEXOL 350 MG/ML SOLN
75.0000 mL | Freq: Once | INTRAVENOUS | Status: AC | PRN
  Administered 2022-03-07: 75 mL via INTRAVENOUS

## 2022-03-07 MED ORDER — ENOXAPARIN SODIUM 100 MG/ML IJ SOSY
100.0000 mg | PREFILLED_SYRINGE | Freq: Two times a day (BID) | INTRAMUSCULAR | Status: DC
  Administered 2022-03-07: 100 mg via SUBCUTANEOUS
  Filled 2022-03-07 (×2): qty 1

## 2022-03-07 MED ORDER — DABIGATRAN ETEXILATE MESYLATE 150 MG PO CAPS: 150.0000 mg | ORAL_CAPSULE | Freq: Two times a day (BID) | ORAL | Status: DC

## 2022-03-07 MED ORDER — ENOXAPARIN SODIUM 100 MG/ML IJ SOSY
100.0000 mg | PREFILLED_SYRINGE | Freq: Two times a day (BID) | INTRAMUSCULAR | 0 refills | Status: DC
  Filled 2022-03-07: qty 10, 5d supply, fill #0

## 2022-03-07 NOTE — ED Triage Notes (Addendum)
Pt states he had an MRI on Monday, advised that he "has a blood clot in a vein in the brain." Pt denies any/all stroke-like symptoms, states he only recalls having "one seizure/broken back a month ago that started this workup."

## 2022-03-07 NOTE — Progress Notes (Signed)
CT venogram personally reviewed and reviewed with radiology, no clear evidence of cerebral venous sinus thrombosis  Unfortunately I unable to review original MRI sequences myself, but discussed with radiology that the particular location of this thrombosis may be more easily seen on MRI  Unclear at this time significance of the pathology, discussed this with the patient.  He is significantly claustrophobic and declines repeat MRI here at this time  He will prefer to follow-up closely with [facility] and continue anticoagulation for now understanding risks of anticoagulation which were reviewed in detail  Fortunately remains asymptomatic at this time and is following seizure precautions, not driving, continuing to work in [employer].  I do not see a need for work restriction at this time and beyond seizure precautions already in place  Brooke Dare MD-PhD [facility] [phone]

## 2022-03-07 NOTE — ED Notes (Signed)
Pt verbalized understanding of d/c instructions, meds, and followup care. Denies questions. VSS, no distress noted. Steady gait to exit with all belongings.  ?

## 2022-03-07 NOTE — Telephone Encounter (Signed)
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     MRI brain wwo contrast 03/07/2022: 1. Abnormal signal within a relatively prominent vein that runs obliquely along the lateral left cerebral hemisphere, consistent with cortical vein thrombosis. The vein runs from the region of the left post-central sulcus inferiorly and anteriorly along the lateral margin of the left sylvian fissure to the anterolateral left middle cranial fossa. This is probably the left vein of [place]. The superior sagittal sinus and other dural venous sinuses appear patent on the post-contrast images. This is strictly age-indeterminate. There is intrinsically T1-hyperintense signal within the vein, consistent with methemoglobin, which would suggest a subacute age. Given that the scanned paperwork states that the patient had a seizure approximately one month ago, the patient could have had cerebral edema at that time which has since resolved.   2. Otherwise, normal MRI of the brain without and with contrast. Specifically, no cerebral edema related to the thrombosed cortical vein at this time.

## 2022-03-07 NOTE — Telephone Encounter (Signed)
Pt called no answer called his wife who is on the DPR advised them that Dr Allena Katz who is covering for Dr Karel Jarvis that he has blood clot in his vein on his brain and that he needs to go to the ER for evaluation, pt wife stated that she was going to go get him and take him. Pt called back and stated that he was going to [place], he asked if this was going to be an over night stay. Pt was advised that they would evaluate him and let him know then.

## 2022-03-07 NOTE — ED Provider Triage Note (Signed)
Emergency Medicine Provider Triage Evaluation Note  Paul [name] , a 51 y.o. male  was evaluated in triage.  Pt complains of abnormal MRI reading and was told to come here.  He has a VTE possibly seen on his MRI.  Patient had a seizure 1 month ago and was following up with neurology.  Was getting an MRI on Monday and was told to come into the ER today given the findings.  Patient denies any other seizures.  He is compliant with his Keppra.  Denies any trouble walking or talking.  Review of Systems  Positive:  Negative:   Physical Exam  BP (!) 141/88 (BP Location: Right Arm)   Pulse 63   Temp 98.2 F (36.8 C) (Oral)   Resp 16   SpO2 98%  Gen:   Awake, no distress   Resp:  Normal effort  MSK:   Moves extremities without difficulty  Other:  Strength equal in patient's upper and lower bilateral extremities.  Cranial nerves II through XII intact.  Patient answering questions appropriately with appropriate speech.  No pronator drift.  Medical Decision Making  Medically screening exam initiated at 3:36 PM.  Appropriate orders placed.  Paul [name] was informed that the remainder of the evaluation will be completed by another provider, this initial triage assessment does not replace that evaluation, and the importance of remaining in the ED until their evaluation is complete.   Spoke with Khalquidina who will assess the patient at bedside.  Commended ordering basic labs.   Achille Rich, PA-C 03/07/22 1539

## 2022-03-07 NOTE — Discharge Instructions (Addendum)
We evaluated you today in the emergency department after your abnormal MRI.  We obtained a CT scan of your brain with contrast, but did not see sign of a blood clot.  We discussed performing a repeat MRI here, or following up as an outpatient and continuing anticoagulation.  Since you are overall healthy and the risk of bleeding is low, it is safe to continue anticoagulation until you can see your neurologist for further testing.  Please turn to the emergency department if you develop any black stools, vomiting blood, severe headaches, vomiting, vision changes, blood in your stool, or any other concerning symptoms.

## 2022-03-07 NOTE — Telephone Encounter (Signed)
Carlye Grippe LPN called with results from MRI head with and with out contrast abnormal signal with in a relatively prominent vein that runs obliquely along the lateral left cerebral hemisphere consistent with cortical vein thrombosis.  Copy of full result faxed over placed on Dr Aquino's desk

## 2022-03-07 NOTE — Consult Note (Signed)
Neurology Consultation  Reason for Consult: abnormal MRI Referring Physician: Karel Jarvis  CC: Venous sinus thrombosis on MRI  History is obtained from:Patient, Chart Review  HPI: Paul [name] is a 51 y.o. male with a past medical history of syncope vs seizures, abdominal pain, cholelithiasis, kidney stones, and insomnia presenting after an MRI for seizure work up with what appears to be a venous sinus thrombosis.  In February 2022, while he and his wife was help kids with their daily routine, he was brushing teeth for his little one, he suddenly felt cloudy, dizziness, he tried to find a place to rest, suddenly fell to the ground on the right side, wife noticed he has facial twitching, he has transient loss of consciousness, postevent confusion, whole episode take about 30 minutes to clear About 2 weeks later in March 2022, he experienced another event at work, he was talking on the phone, he felt abnormal sensation over taking him, word finding difficulty, dizziness, trouble thinking lasting for couple minutes, then cleared up.  Dr. [name] ordered an MRI and EEG, and prescribed Lamotrigine, however he did not start it and did not complete the studies. He was in the ER on 02/02/2022 after a nocturnal seizure witnessed by his wife. He felt fine the night prior and went to bed at 1:30am, then at 5:30am his wife heard his call out and saw his arms moving up and down. She could not wake him up, then after 60 seconds he went slack with sonorous respirations. He woke up to EMS around him, his wife reported he woke up after 10 minutes but was confused and groggy. He recalls being able to answer some questions but not all correctly, no tongue bite, incontinence, or focal weakness. He felt sore in his back and calves. Bloodwork showed a creatinine of 1.27, EtOH and UDS negative. I personally reviewed head CT without contrast which did not show any acute changes. A CT lumbar spine without contrast showed an acute T12  superior endplate compression fracture with 20% loss of vertebral body height. He was referred to neurosurgery.   ROS: Full ROS was performed and is negative except as noted in the HPI.   Past Medical History:  Diagnosis Date   Abdominal pain    Cholelithiasis    History of kidney stones    Insomnia    Syncope      Family History  Problem Relation Age of Onset   Healthy Mother    Lung cancer Father      Social History:   reports that he has never smoked. He has never used smokeless tobacco. He reports current alcohol use. He reports that he does not use drugs.  Medications  Current Facility-Administered Medications:    enoxaparin (LOVENOX) injection 100 mg, 100 mg, Subcutaneous, Q12H, Erick Blinks, MD  Current Outpatient Medications:    enoxaparin (LOVENOX) 100 MG/ML injection, Inject 1 mL (100 mg total) into the skin every 12 (twelve) hours., Disp: 10 mL, Rfl: 0   acetaminophen (TYLENOL) 325 MG tablet, Take 2 tablets (650 mg total) by mouth every 6 (six) hours as needed for up to 30 doses for moderate pain or mild pain., Disp: 30 tablet, Rfl: 0   acetaminophen (TYLENOL) 500 MG tablet, Take 500 mg by mouth as needed for pain., Disp: , Rfl:    ibuprofen (ADVIL) 600 MG tablet, Take 1 tablet (600 mg total) by mouth every 6 (six) hours as needed for up to 30 doses for mild pain or moderate  pain. (Patient not taking: Reported on 02/07/2022), Disp: 30 tablet, Rfl: 0   levETIRAcetam (KEPPRA) 500 MG tablet, Take 1 tablet twice a day, Disp: 60 tablet, Rfl: 11   oxyCODONE (ROXICODONE) 5 MG immediate release tablet, Take 1 tablet (5 mg total) by mouth every 6 (six) hours as needed for up to 10 doses for severe pain., Disp: 10 tablet, Rfl: 0   oxyCODONE-acetaminophen (PERCOCET/ROXICET) 5-325 MG tablet, Take 1-2 tablets by mouth every 6 (six) hours as needed for severe pain. (Patient not taking: Reported on 02/07/2022), Disp: 20 tablet, Rfl: 0   tamsulosin (FLOMAX) 0.4 MG CAPS capsule,  Take 1 capsule (0.4 mg total) by mouth daily. (Patient not taking: Reported on 02/07/2022), Disp: 30 capsule, Rfl: 0  Exam: Current vital signs: BP 118/83   Pulse 62   Temp 98.2 F (36.8 C) (Oral)   Resp 12   Wt 99.8 kg   SpO2 99%   BMI 31.57 kg/m  Vital signs in last 24 hours: Temp:  [98.2 F (36.8 C)] 98.2 F (36.8 C) (08/17 1523) Pulse Rate:  [62-63] 62 (08/17 1615) Resp:  [12-16] 12 (08/17 1615) BP: (118-141)/(83-88) 118/83 (08/17 1615) SpO2:  [98 %-99 %] 99 % (08/17 1615) Weight:  [99.8 kg] 99.8 kg (08/17 1600)  GENERAL: Awake, alert in NAD HEENT: - Normocephalic and atraumatic, dry mm, no LN++, no Thyromegally LUNGS - Clear to auscultation bilaterally with no wheezes CV - S1S2 RRR, no m/r/g, equal pulses bilaterally. ABDOMEN - Soft, nontender, nondistended with normoactive BS Ext: warm, well perfused, intact peripheral pulses, no edema  NEURO:  Mental Status: AA&Ox3  Language: speech is clear.  Naming, repetition, fluency, and comprehension intact.  Cranial Nerves: PERRL, EOMI , visual fields full , no facial asymmetry, facial sensation intact, hearing intact, tongue/uvula/soft palate midline, normal sternocleidomastoid and trapezius muscle strength. No evidence of tongue atrophy or fibrillations Motor: 5/5 strength in all extremities  Tone: is normal and bulk is normal Sensation- Intact to light touch bilaterally Coordination: FTN intact bilaterally, no ataxia in BLE. Gait- deferred  Labs I have reviewed labs in epic and the results pertinent to this consultation are:  CBC    Component Value Date/Time   WBC 7.2 02/02/2022 0642   RBC 4.32 02/02/2022 0642   HGB 13.3 02/02/2022 0642   HCT 38.6 (L) 02/02/2022 0642   PLT 167 02/02/2022 0642   MCV 89.4 02/02/2022 0642   MCH 30.8 02/02/2022 0642   MCHC 34.5 02/02/2022 0642   RDW 13.2 02/02/2022 0642   LYMPHSABS 1.7 02/02/2022 0642   MONOABS 0.5 02/02/2022 0642   EOSABS 0.1 02/02/2022 0642   BASOSABS 0.1  02/02/2022 0642    CMP     Component Value Date/Time   NA 141 02/02/2022 0642   K 4.3 02/02/2022 0642   CL 106 02/02/2022 0642   CO2 21 (L) 02/02/2022 0642   GLUCOSE 103 (H) 02/02/2022 0642   BUN 15 02/02/2022 0642   CREATININE 1.27 (H) 02/02/2022 0642   CALCIUM 9.9 02/02/2022 0642   PROT 6.7 02/02/2022 0642   ALBUMIN 4.6 02/02/2022 0642   AST 26 02/02/2022 0642   ALT 30 02/02/2022 0642   ALKPHOS 41 02/02/2022 0642   BILITOT 0.7 02/02/2022 0642   GFRNONAA >60 02/02/2022 0642   GFRAA >60 01/11/2016 0930    Lipid Panel  No results found for: "CHOL", "TRIG", "HDL", "CHOLHDL", "VLDL", "LDLCALC", "LDLDIRECT"   Imaging I have reviewed the images obtained:  CT-head- no acute abnormality - 7/15  MRI examination of the brain Abnormal signal within a relatively prominent vein that runs obliquely along the lateral left cerebral hemisphere, consistent with cortical vein thrombosis. The vein runs from the region of the left post-central sulcus inferiorly and anteriorly along the lateral margin of the left sylvian fissure to the anterolateral left middle cranial fossa. This is probably the left vein of [place]. The superior sagittal sinus and other dural venous sinuses appear patent on the post-contrast images. This is strictly age-indeterminate. There is intrinsically T1-hyperintense signal within the vein, consistent with methemoglobin, which would suggest a subacute age. Given that the scanned paperwork states that the patient had a seizure approximately one month ago, the patient could have had cerebral edema at that time which has since resolved.   Assessment: Paul [name] is a 51 y.o. male with a past medical history of syncope vs seizures, abdominal pain, cholelithiasis, kidney stones, and insomnia presenting after an MRI for seizure work up with what appears to be a venous sinus thrombosis. He was initially being seen for a seizure work up and has also been started on Keppra. We will  start lovenox for 5 days and then bridge to Pradaxa 150mg . We will also get a CT venogram to confirm thrombosis.   Impression: Acute/subacute venous sinus thrombus   Recommendations: - LMWH subcutaneous for 5 days and then Pradaxa 150mg  BID - Follow up with neurology outpatient  - Repeat MRI or CT Venous Head outpatient to assess progress after 3 months - Follow up with Dr. in [facility] - hypercoag panel.  -- Patient seen and examined by NP/APP with MD. MD to update note as needed.   Pearlean Brownie, DNP, FNP-BC [facility] Pager: [phone]  NEUROHOSPITALIST ADDENDUM Performed a face to face diagnostic evaluation.   I have reviewed the contents of history and physical exam as documented by PA/ARNP/Resident and agree with above documentation.  I have discussed and formulated the above plan as documented. Edits to the note have been made as needed.  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 Will get CT Venogram which is superior to contrast MRI specially when looking at the cavernous sinus. Patient has no symptoms what so ever from this. Will do Full dose LMWH in the acute phase for 5 days and then switch to pradaxa 150mg  BID from day 6 onwards. Agree with hypercoagulable workup. Follow up with Dr. Elmer Picker at [facility] and outpatient CT Venogram in 3-6 months to evaluate for any progression of the sinus thrombosis.  , MD [facility] Pearlean Brownie   If 7pm to 7am, please call on call as listed on AMION.

## 2022-03-07 NOTE — ED Provider Notes (Signed)
[facility] EMERGENCY DEPARTMENT Provider Note  CSN: [id number] Arrival date & time: 03/07/22 1340  Chief Complaint(s) abnormal MRI  HPI Paul [name]. is a 51 y.o. male with history of epilepsy presenting to the emergency department with abnormal MRI.  Patient has recently had a seizure order diagnosed, had MRI as part of the work-up, which showed possible cerebral venous sinus thrombosis.  Patient reports overall feels well, denies recent seizures.  Denies vision changes, severe headaches, nausea, vomiting, weakness, numbness, tingling, speech difficulty, facial droop.  He was sent here by his neurologist.  The result was obtained today.  He has no symptoms currently so there are no modifying factors or associated symptoms.   Past Medical History Past Medical History:  Diagnosis Date   Abdominal pain    Cholelithiasis    History of kidney stones    Insomnia    Syncope    Patient Active Problem List   Diagnosis Date Noted   Confusion 11/28/2020   Partial symptomatic epilepsy with complex partial seizures, not intractable, without status epilepticus (HCC) 11/28/2020   Choledocholithiasis 04/19/2013   CAP (community acquired pneumonia) 04/19/2013   Dehydration 04/19/2013   Cholecystitis, acute s/p open cholecystectomy 11/20/12 11/02/2012   Home Medication(s) Prior to Admission medications   Medication Sig Start Date End Date Taking? Authorizing Provider  enoxaparin (LOVENOX) 100 MG/ML injection Inject 1 mL (100 mg total) into the skin every 12 (twelve) hours. 03/07/22 03/07/23 Yes Erick Blinks, MD  acetaminophen (TYLENOL) 325 MG tablet Take 2 tablets (650 mg total) by mouth every 6 (six) hours as needed for up to 30 doses for moderate pain or mild pain. 02/02/22   Terald Sleeper, MD  acetaminophen (TYLENOL) 500 MG tablet Take 500 mg by mouth as needed for pain.    [provider]  ibuprofen (ADVIL) 600 MG tablet Take 1 tablet (600 mg total) by  mouth every 6 (six) hours as needed for up to 30 doses for mild pain or moderate pain. Patient not taking: Reported on 02/07/2022 02/02/22   Terald Sleeper, MD  levETIRAcetam (KEPPRA) 500 MG tablet Take 1 tablet twice a day 02/07/22   Van Clines, MD  oxyCODONE (ROXICODONE) 5 MG immediate release tablet Take 1 tablet (5 mg total) by mouth every 6 (six) hours as needed for up to 10 doses for severe pain. 02/02/22   Terald Sleeper, MD  oxyCODONE-acetaminophen (PERCOCET/ROXICET) 5-325 MG tablet Take 1-2 tablets by mouth every 6 (six) hours as needed for severe pain. Patient not taking: Reported on 02/07/2022 12/26/21   Lorre Nick, MD  tamsulosin (FLOMAX) 0.4 MG CAPS capsule Take 1 capsule (0.4 mg total) by mouth daily. Patient not taking: Reported on 02/07/2022 12/26/21   Lorre Nick, MD  Past Surgical History Past Surgical History:  Procedure Laterality Date   CHOLECYSTECTOMY N/A 11/20/2012   Procedure: LAPAROSCOPIC CHOLECYSTECTOMY converted to open cholecystectomy with  CHOLANGIOGRAM;  Surgeon: Adolph Pollackodd J Rosenbower, MD;  Location: [facility];  Service: General;  Laterality: N/A;   ERCP N/A 11/22/2012   Procedure: ENDOSCOPIC RETROGRADE CHOLANGIOPANCREATOGRAPHY (ERCP);  Surgeon: Petra KubaMarc E Magod, MD;  Location: [facility];  Service: Endoscopy;  Laterality: N/A;   ERCP N/A 04/21/2013   Procedure: ENDOSCOPIC RETROGRADE CHOLANGIOPANCREATOGRAPHY (ERCP);  Surgeon: Petra KubaMarc E Magod, MD;  [facility];  Service: Endoscopy;  Laterality: N/A;   ESOPHAGOGASTRODUODENOSCOPY N/A 01/01/2013   Procedure: ESOPHAGOGASTRODUODENOSCOPY (EGD);  Surgeon: Petra KubaMarc E Magod, MD;  [facility];  Service: Endoscopy;  Laterality: N/A;   EXTRACORPOREAL SHOCK WAVE LITHOTRIPSY Right 12/31/2021   Procedure: EXTRACORPOREAL SHOCK WAVE LITHOTRIPSY (ESWL);  Surgeon: Jerilee FieldEskridge, Matthew, MD;  [facility]  [place];  Service: Urology;  Laterality: Right;   HERNIA REPAIR     Childhood./ inguinal   WISDOM TOOTH EXTRACTION     Family History Family History  Problem Relation Age of Onset   Healthy Mother    Lung cancer Father     Social History Social History   Tobacco Use   Smoking status: Never   Smokeless tobacco: Never  Substance Use Topics   Alcohol use: Yes    Comment: 1-2 drinks/week all of the above   Drug use: Never   Allergies Dilaudid [hydromorphone hcl] and Lunesta [eszopiclone]  Review of Systems Review of Systems  All other systems reviewed and are negative.   Physical Exam Vital Signs  I have reviewed the triage vital signs BP 110/72   Pulse (!) 107   Temp 98.5 F (36.9 C) (Oral)   Resp 13   Wt 99.8 kg   SpO2 98%   BMI 31.57 kg/m  Physical Exam Vitals and nursing note reviewed.  Constitutional:      General: He is not in acute distress.    Appearance: Normal appearance.  HENT:     Mouth/Throat:     Mouth: Mucous membranes are moist.  Cardiovascular:     Rate and Rhythm: Normal rate and regular rhythm.  Pulmonary:     Effort: Pulmonary effort is normal. No respiratory distress.     Breath sounds: Normal breath sounds.  Abdominal:     General: Abdomen is flat.     Palpations: Abdomen is soft.     Tenderness: There is no abdominal tenderness.  Skin:    General: Skin is warm and dry.     Capillary Refill: Capillary refill takes less than 2 seconds.  Neurological:     Mental Status: He is alert and oriented to person, place, and time. Mental status is at baseline.     Comments: Cranial nerves II through XII intact, strength 5 out of 5 in the bilateral upper and lower extremities, no sensory deficit to light touch, no dysmetria on finger-nose-finger testing, ambulatory with steady gait.   Psychiatric:        Mood and Affect: Mood normal.        Behavior: Behavior normal.     ED Results and Treatments Labs (all labs ordered are  listed, but only abnormal results are displayed) Labs Reviewed  COMPREHENSIVE METABOLIC PANEL - Abnormal; Notable for the following components:      Result Value   CO2 21 (*)    Albumin 5.2 (*)    ALT 46 (*)    All other components within normal  limits  ANTITHROMBIN III - Abnormal; Notable for the following components:   AntiThromb III Func 126 (*)    All other components within normal limits  CBC WITH DIFFERENTIAL/PLATELET  APTT  PROTIME-INR  SEDIMENTATION RATE  C-REACTIVE PROTEIN  URINALYSIS, ROUTINE W REFLEX MICROSCOPIC  BETA-2-GLYCOPROTEIN I ABS, IGG/M/A  HOMOCYSTEINE  CARDIOLIPIN ANTIBODIES, IGG, IGM, IGA  FACTOR 5 LEIDEN  LUPUS ANTICOAGULANT PANEL  PROTEIN C, TOTAL  PROTEIN S, TOTAL  PROTEIN S ACTIVITY  PROTEIN C ACTIVITY  PROTHROMBIN GENE MUTATION                                                                                                                          Radiology CT VENOGRAM HEAD  Result Date: 03/07/2022 CLINICAL DATA:  Possible dural venous sinus thrombosis EXAM: CT VENOGRAM HEAD TECHNIQUE: Venographic phase images of the brain were obtained following the administration of intravenous contrast. Multiplanar reformats and maximum intensity projections were generated. RADIATION DOSE REDUCTION: This exam was performed according to the departmental dose-optimization program which includes automated exposure control, adjustment of the mA and/or kV according to patient size and/or use of iterative reconstruction technique. CONTRAST:  74mL OMNIPAQUE IOHEXOL 350 MG/ML SOLN COMPARISON:  None Available. FINDINGS: Brain: There is no mass, hemorrhage or extra-axial collection. The size and configuration of the ventricles and extra-axial CSF spaces are normal. The brain parenchyma is normal, without acute or chronic infarction. Vascular: No abnormal hyperdensity of the major intracranial arteries or dural venous sinuses. No intracranial atherosclerosis. Superior sagittal sinus:  Normal. Straight sinus: Normal. Inferior sagittal sinus, vein of [place] and internal cerebral veins: Normal. Transverse sinuses: Normal. Sigmoid sinuses: Normal. Visualized jugular veins: Normal. No cortical venous thrombosis is visible. Skull: The visualized skull base, calvarium and extracranial soft tissues are normal. Sinuses/Orbits: No fluid levels or advanced mucosal thickening of the visualized paranasal sinuses. No mastoid or middle ear effusion. The orbits are normal. IMPRESSION: Normal CT venogram. Electronically Signed   By: Deatra Robinson M.D.   On: 03/07/2022 19:21    Pertinent labs & imaging results that were available during my care of the patient were reviewed by me and considered in my medical decision making (see MDM for details).  Medications Ordered in ED Medications  enoxaparin (LOVENOX) injection 100 mg (100 mg Subcutaneous Given 03/07/22 1737)  dabigatran (PRADAXA) capsule 150 mg (has no administration in time range)  iohexol (OMNIPAQUE) 350 MG/ML injection 75 mL (75 mLs Intravenous Contrast Given 03/07/22 1844)  Procedures Procedures  (including critical care time)  Medical Decision Making / ED Course   MDM:  51 year old male presenting to the emergency department with abnormal MRI result.  Although exam reassuring, neurology was consulted, evaluated the patient.  They requested to obtain CT venogram to further evaluate.  CT venogram did not show venous sinus thrombosis.  Discussed again with Dr. Iver Nestle who came and saw the patient, discussed findings with the patient including normal head CT.  She discussed the possibility of obtaining a repeat MRI here versus following up as an outpatient with anticoagulation, the patient elects to be discharged and follow-up as an outpatient.  Patient overall asymptomatic, appreciate neurology  recommendations, and will discharge the patient for outpatient follow-up.   Clinical Course as of 03/07/22 2144  Thu Mar 07, 2022  1717 Pearlean Brownie [WS]    Clinical Course User Index [WS] Lonell Grandchild, MD     Additional history obtained: -Additional history obtained from wife -External records from outside source obtained and reviewed including: Chart review including previous notes, labs, imaging, consultation notes   Lab Tests: -I ordered, reviewed, and interpreted labs.   The pertinent results include:   Labs Reviewed  COMPREHENSIVE METABOLIC PANEL - Abnormal; Notable for the following components:      Result Value   CO2 21 (*)    Albumin 5.2 (*)    ALT 46 (*)    All other components within normal limits  ANTITHROMBIN III - Abnormal; Notable for the following components:   AntiThromb III Func 126 (*)    All other components within normal limits  CBC WITH DIFFERENTIAL/PLATELET  APTT  PROTIME-INR  SEDIMENTATION RATE  C-REACTIVE PROTEIN  URINALYSIS, ROUTINE W REFLEX MICROSCOPIC  BETA-2-GLYCOPROTEIN I ABS, IGG/M/A  HOMOCYSTEINE  CARDIOLIPIN ANTIBODIES, IGG, IGM, IGA  FACTOR 5 LEIDEN  LUPUS ANTICOAGULANT PANEL  PROTEIN C, TOTAL  PROTEIN S, TOTAL  PROTEIN S ACTIVITY  PROTEIN C ACTIVITY  PROTHROMBIN GENE MUTATION      EKG   EKG Interpretation  Date/Time:    Ventricular Rate:    PR Interval:    QRS Duration:   QT Interval:    QTC Calculation:   R Axis:     Text Interpretation:           Imaging Studies ordered: I ordered imaging studies including CT venogram I independently visualized and interpreted imaging. I agree with the radiologist interpretation   Medicines ordered and prescription drug management: Meds ordered this encounter  Medications   enoxaparin (LOVENOX) injection 100 mg   enoxaparin (LOVENOX) 100 MG/ML injection    Sig: Inject 1 mL (100 mg total) into the skin every 12 (twelve) hours.    Dispense:  10 mL    Refill:  0    dabigatran (PRADAXA) capsule 150 mg   iohexol (OMNIPAQUE) 350 MG/ML injection 75 mL    -I have reviewed the patients home medicines and have made adjustments as needed    Consultations Obtained: I requested consultation with the neurologist,  and discussed lab and imaging findings as well as pertinent plan - they recommend: shared decision making - pt elects to be discharge and start [facility], will f/u as outpatient.    Cardiac Monitoring: The patient was maintained on a cardiac monitor.  I personally viewed and interpreted the cardiac monitored which showed an underlying rhythm of: NSR  Social Determinants of Health:  Factors impacting patients care include: none   Reevaluation: After the interventions noted above, I reevaluated the patient and  found that they have :stayed the same  Co morbidities that complicate the patient evaluation  Past Medical History:  Diagnosis Date   Abdominal pain    Cholelithiasis    History of kidney stones    Insomnia    Syncope       Dispostion: I considered admission for this patient, for further neurologic monitoring, MRI, but after shared decision-making, the patient reports that he would prefer to go home, initiate anticoagulation, and follow-up as an outpatient.     Final Clinical Impression(s) / ED Diagnoses Final diagnoses:  Abnormal MRI  Hx of CT scan of head  History of seizure     This chart was dictated using voice recognition software.  Despite best efforts to proofread,  errors can occur which can change the documentation meaning.    Lonell Grandchild, MD 03/07/22 2144

## 2022-03-08 LAB — CARDIOLIPIN ANTIBODIES, IGG, IGM, IGA
Anticardiolipin IgA: <9 APL U/mL (ref 0–11)
Anticardiolipin IgG: <9 GPL U/mL (ref 0–14)
Anticardiolipin IgM: <9 MPL U/mL (ref 0–12)

## 2022-03-08 LAB — BETA-2-GLYCOPROTEIN I ABS, IGG/M/A
Beta-2 Glyco I IgG: <9 GPI IgG units (ref 0–20)
Beta-2-Glycoprotein I IgA: <9 GPI IgA units (ref 0–25)
Beta-2-Glycoprotein I IgM: <9 GPI IgM units (ref 0–32)

## 2022-03-11 ENCOUNTER — Telehealth: Payer: Self-pay | Admitting: Neurology

## 2022-03-11 NOTE — Telephone Encounter (Signed)
Patient did go to ER 03/07/22. He had a blood clot on MRI. He thinks he needs to be seen before 10/25. He would like aquino to review information and let him know.

## 2022-03-12 DIAGNOSIS — S22080A Wedge compression fracture of T11-T12 vertebra, initial encounter for closed fracture: Secondary | ICD-10-CM | POA: Diagnosis not present

## 2022-03-12 NOTE — Telephone Encounter (Signed)
Scheduled for 8/23

## 2022-03-13 ENCOUNTER — Encounter: Payer: Self-pay | Admitting: Neurology

## 2022-03-13 ENCOUNTER — Other Ambulatory Visit (INDEPENDENT_AMBULATORY_CARE_PROVIDER_SITE_OTHER): Payer: BC Managed Care – PPO

## 2022-03-13 ENCOUNTER — Ambulatory Visit (INDEPENDENT_AMBULATORY_CARE_PROVIDER_SITE_OTHER): Payer: BC Managed Care – PPO | Admitting: Neurology

## 2022-03-13 VITALS — BP 132/84 | HR 62 | Resp 18 | Ht 70.0 in | Wt 219.0 lb

## 2022-03-13 DIAGNOSIS — G08 Intracranial and intraspinal phlebitis and thrombophlebitis: Secondary | ICD-10-CM

## 2022-03-13 DIAGNOSIS — R569 Unspecified convulsions: Secondary | ICD-10-CM | POA: Diagnosis not present

## 2022-03-13 DIAGNOSIS — R410 Disorientation, unspecified: Secondary | ICD-10-CM | POA: Diagnosis not present

## 2022-03-13 DIAGNOSIS — E860 Dehydration: Secondary | ICD-10-CM | POA: Diagnosis not present

## 2022-03-13 MED ORDER — DABIGATRAN ETEXILATE MESYLATE 150 MG PO CAPS
150.0000 mg | ORAL_CAPSULE | Freq: Two times a day (BID) | ORAL | 5 refills | Status: DC
Start: 2022-03-13 — End: 2022-03-15

## 2022-03-13 NOTE — Progress Notes (Signed)
NEUROLOGY FOLLOW UP OFFICE NOTE  Paul [name] [id number] 1970-09-21  HISTORY OF PRESENT ILLNESS: I had the pleasure of seeing Paul [name] in follow-up in the neurology clinic on 03/13/2022.  The patient was last seen a month ago after he had a nocturnal convulsion on 02/02/2022. He is accompanied by his wife Paul [name] who helps supplement the history today.  Records and images were personally reviewed where available. His EEG done last week was normal. He had a brain MRI with and without contrast on 03/04/22 which showed abnormal signal within a relatively prominent vein that runs obliquely along the lateral left cerebral hemisphere, consistent with cortical vein thrombosis. The vein runs from the region of the left post-central sulcus inferiorly and anteriorly along the lateral margin of the left sylvian fissure to the anterolateral left middle cranial fossa. This is probably the left vein of Trolard. It is age-indeterminate, intrinsically T1-hyperintense signal within the vein (consistent with methemoglobin) would suggest a subacute age. No cerebral edema. No mesial temporal sclerosis or infarct seen. He was instructed to go to the ER on 8/17 where he had a normal CT venogram and was discharged home on Lovenox injection for 5 days then bridge to Pradaxa 150mg  BID. He had a hypercoagulable panel with antithrombin II of 126 (ref [id number]). They report that he had his last dose of Lovenox but did not have the Pradaxa prescription.   They deny any seizures or seizure-like symptoms since 02/02/2022. No staring/unresponsive episodes, gaps in time, olfactory/gustatory hallucinations, focal numbness/tingling/weakness, myoclonic jerks. He is tolerating Levetiracetam 500mg  BID better, agitation and sleepiness have improved. He denies any headaches, dizziness, vision changes, speech difficulties, no falls. He still has back pain and has seen Neurosurgery, with his back brace for another 4 weeks.    History on Initial  Assessment 02/07/2022: This is  51 year old right-handed man with a history of ureteral stone, presenting for evaluation of seizures. He was in the ER on 02/02/2022 after a nocturnal seizure witnessed by his wife. He felt fine the night prior and went to bed at 1:30am, then at 5:30am his wife heard his call out and saw his arms moving up and down. She could not wake him up, then after 60 seconds he went slack with sonorous respirations. He woke up to EMS around him, his wife reported he woke up after 10 minutes but was confused and groggy. He recalls being able to answer some questions but not all correctly, no tongue bite, incontinence, or focal weakness. He felt sore in his back and calves. Bloodwork showed a creatinine of 1.27, EtOH and UDS negative. I personally reviewed head CT without contrast which did not show any acute changes. A CT lumbar spine without contrast showed an acute T12 superior endplate compression fracture with 20% loss of vertebral body height. He was discharged home on Levetiracetam 500mg  BID.   He was evaluated by neurologist Paul [name] in May 2022 after he had an episode in February 2022 while fixing his daughter's hair, he suddenly felt cloudy, dizzy, and tried to find a place to rest, when he suddenly fell to the floor on his right side and his wife noticed facial twitching. He was out briefly and took 30 minutes to clear up. Around 2 weeks later, he was at work talking on the phone when he felt an abnormal sensation, he was able to understand the conversation but had trouble finding and pronouncing his words for 3 minutes. There was no associated headache  or any other symptoms, no loss of consciousness. Dr. Terrace [name] ordered an MRI and EEG, and prescribed Lamotrigine, however he did not start it and did not complete the studies. He denies any similar episodes since then. He and his brother-in-law deny any staring/unresponsive episodes, gaps in time, olfactory/gustatory hallucinations, deja vu,  rising epigastric sensation, focal numbness/tingling/weakness, myoclonic jerks. Since the seizure, Paul [name] has noticed he is slower to formulate his words, it is not very noticeable, but Paul [name] notes he is more deliberate. He denies any headaches, dizziness, vision changes, dysarthria/dysphagia, neck pain, bowel/bladder dysfunction. He has no prior history of back pain, but since the seizure, his back has been bothering him, he was given Roxicodone in the ER. He has been referred to [facility] and is awaiting an appointment. He has never been a good sleeper, usually getting 5 hours of sleep, but had no recent change in sleep pattern. He has prn Zaleplon. He drinks 1-2 alcohol beverages a week and had a beer the night prior to the seizure. Mood is pretty good, he may be a little more irritable. Memory is okay. He works in Air[employer].    Epilepsy Risk Factors:  He had a normal birth and early development.  There is no history of febrile convulsions, CNS infections such as meningitis/encephalitis, significant traumatic brain injury, neurosurgical procedures, or family history of seizures.   PAST MEDICAL HISTORY: Past Medical History:  Diagnosis Date   Abdominal pain    Cholelithiasis    History of kidney stones    Insomnia    Syncope     MEDICATIONS: Current Outpatient Medications on File Prior to Visit  Medication Sig Dispense Refill   zaleplon (SONATA) 10 MG capsule 1 capsule at bedtime as needed     acetaminophen (TYLENOL) 325 MG tablet Take 2 tablets (650 mg total) by mouth every 6 (six) hours as needed for up to 30 doses for moderate pain or mild pain. 30 tablet 0   acetaminophen (TYLENOL) 500 MG tablet Take 500 mg by mouth as needed for pain.     Ascorbic Acid (VITAMIN C) 500 MG CHEW 1 tablet     enoxaparin (LOVENOX) 100 MG/ML injection Inject 1 mL (100 mg total) into the skin every 12 (twelve) hours. 10 mL 0   ibuprofen (ADVIL) 600 MG tablet Take 1 tablet (600 mg total) by mouth every  6 (six) hours as needed for up to 30 doses for mild pain or moderate pain. (Patient not taking: Reported on 02/07/2022) 30 tablet 0   levETIRAcetam (KEPPRA) 500 MG tablet Take 1 tablet twice a day 60 tablet 11   oxyCODONE (ROXICODONE) 5 MG immediate release tablet Take 1 tablet (5 mg total) by mouth every 6 (six) hours as needed for up to 10 doses for severe pain. 10 tablet 0   oxyCODONE-acetaminophen (PERCOCET/ROXICET) 5-325 MG tablet Take 1-2 tablets by mouth every 6 (six) hours as needed for severe pain. (Patient not taking: Reported on 02/07/2022) 20 tablet 0   tamsulosin (FLOMAX) 0.4 MG CAPS capsule Take 1 capsule (0.4 mg total) by mouth daily. (Patient not taking: Reported on 02/07/2022) 30 capsule 0   No current facility-administered medications on file prior to visit.    ALLERGIES: Allergies  Allergen Reactions   Dilaudid [Hydromorphone Hcl] Anaphylaxis    Appears to tolerate morphine and oxycodone.   Dilaudid [Hydromorphone] Anaphylaxis   Lunesta [Eszopiclone] Other (See Comments)    Bad taste    FAMILY HISTORY: Family History  Problem Relation Age of  Onset   Healthy Mother    Lung cancer Father     SOCIAL HISTORY: Social History   Socioeconomic History   Marital status: Married    Spouse name: Not on file   Number of children: 3   Years of education: college   Highest education level: Not on file  Occupational History   Occupation: sales  Tobacco Use   Smoking status: Never   Smokeless tobacco: Never  Substance and Sexual Activity   Alcohol use: Yes    Comment: 1-2 drinks/week all of the above   Drug use: Never   Sexual activity: Never  Other Topics Concern   Not on file  Social History Narrative   Lives at home with family.two story home   Right-handed.   Caffeine use: 2 cups per day.   Social Determinants of Health   Financial Resource Strain: Not on file  Food Insecurity: Not on file  Transportation Needs: Not on file  Physical Activity: Not on file   Stress: Not on file  Social Connections: Not on file  Intimate Partner Violence: Not on file     PHYSICAL EXAM: Vitals:   03/13/22 1404  BP: 132/84  Pulse: 62  Resp: 18  SpO2: 96%   General: No acute distress Head:  Normocephalic/atraumatic Skin/Extremities: No rash, no edema Neurological Exam: alert and awake. No aphasia or dysarthria. Fund of knowledge is appropriate.  Attention and concentration are normal.   Cranial nerves: Pupils equal, round. Extraocular movements intact with no nystagmus. Visual fields full.  No facial asymmetry.  Motor: Bulk and tone normal, muscle strength 5/5 throughout with no pronator drift.   Finger to nose testing intact.  Gait narrow-based and steady, able to tandem walk adequately.  Romberg negative.   IMPRESSION: This is  [name] yo RH man with a history of ureteral stone, with new onset seizure on 02/02/2022. He had an episode of loss of consciousness with facial twitching in February 2022 and a transient episode of word-finding difficulties in March 2022. 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 No edema. EEG normal. We discussed how seizures can be a presentation of CVT. Initial hypercoagulable workup was ordered in the ER, we will complete bloodwork and order protein C, protein S, antiphospholipid Ab, factor V Leiden, prothrombin G20210A, homocysteine, ANA, D-dimer. Start Pradaxa 150mg  BID. Interval follow-up open MRV will be ordered for November 2023. He denies any seizures since 01/2022, continue Levetiracetam 500mg  BID. He will be referred to Hematology for abnormal hypercoagulable panel. He is aware of [place] driving laws to stop driving until 6 months seizure-free. Follow-up after MRV, call for any changes.   Thank you for allowing me to participate in his care.  Please do not hesitate to call for any questions or concerns.    Ellouise Newer,  M.D.   CC: Dr. Shelia Media

## 2022-03-13 NOTE — Patient Instructions (Addendum)
Good to see you doing well.  Bloodwork for protein C,protein S, antiphospholipid Ab, factor V Leiden, prothrombin G20210A, homocysteine, ANA, D-dimer  2. Start Pradaxa 150mg  twice a day  3. Schedule open MRV with and without contrast for November 2023  4. Referral will be sent to Hematology  5. Follow-up after MRI, call for any changes   Seizure Precautions: 1. If medication has been prescribed for you to prevent seizures, take it exactly as directed.  Do not stop taking the medicine without talking to your doctor first, even if you have not had a seizure in a long time.   2. Avoid activities in which a seizure would cause danger to yourself or to others.  Don't operate dangerous machinery, swim alone, or climb in high or dangerous places, such as on ladders, roofs, or girders.  Do not drive unless your doctor says you may.  3. If you have any warning that you may have a seizure, lay down in a safe place where you can't hurt yourself.    4.  No driving for 6 months from last seizure, as per [facility].   Please refer to the following link on the [employer]'s website for more information: http://www.epilepsyfoundation.org/answerplace/Social/driving/drivingu.cfm   5.  Maintain good sleep hygiene.  6.  Notify your neurology if you are planning pregnancy or if you become pregnant.  7.  Contact your doctor if you have any problems that may be related to the medicine you are taking.  8.  Call 911 and bring the patient back to the ED if:        A.  The seizure lasts longer than 5 minutes.       B.  The patient doesn't awaken shortly after the seizure  C.  The patient has new problems such as difficulty seeing, speaking or moving  D.  The patient was injured during the seizure  E.  The patient has a temperature over 102 F (39C)  F.  The patient vomited and now is having trouble breathing        Your provider has requested that you have labwork completed  today. Please go to [facility] ([id number]) on the second floor of this building before leaving the office today. You do not need to check in. If you are not called within 15 minutes please check with the front desk.

## 2022-03-13 NOTE — Procedures (Signed)
ELECTROENCEPHALOGRAM REPORT  Date of Study: 03/05/2022  Patient's Name: Terion Hedman. MRN: [id number] Date of Birth: April 29, 1971  Referring Provider: Dr. Patrcia Dolly  Clinical History: This is a 51 year old man with new onset nocturnal convulsion. EEG for classification.  Medications: Keppra  Technical Summary: A multichannel digital EEG recording measured by the international 10-20 system with electrodes applied with paste and impedances below 5000 ohms performed in our laboratory with EKG monitoring in an awake and drowsy patient.  Hyperventilation and photic stimulation were performed.  The digital EEG was referentially recorded, reformatted, and digitally filtered in a variety of bipolar and referential montages for optimal display.    Description: The patient is awake and drowsy during the recording.  During maximal wakefulness, there is a symmetric, medium voltage 10 Hz posterior dominant rhythm that attenuates with eye opening.  The record is symmetric.  During drowsiness, there is an increase in theta slowing of the background.  Sleep was not captured. Hyperventilation and photic stimulation did not elicit any abnormalities.  There were no epileptiform discharges or electrographic seizures seen.    EKG lead was unremarkable.  Impression: This awake and drowsy EEG is normal.    Clinical Correlation: A normal EEG does not exclude a clinical diagnosis of epilepsy.  If further clinical questions remain, prolonged EEG may be helpful.  Clinical correlation is advised.   Patrcia Dolly, M.D.

## 2022-03-14 DIAGNOSIS — R569 Unspecified convulsions: Secondary | ICD-10-CM | POA: Diagnosis not present

## 2022-03-14 DIAGNOSIS — G08 Intracranial and intraspinal phlebitis and thrombophlebitis: Secondary | ICD-10-CM | POA: Diagnosis not present

## 2022-03-15 ENCOUNTER — Other Ambulatory Visit: Payer: Self-pay

## 2022-03-15 ENCOUNTER — Telehealth: Payer: Self-pay | Admitting: Neurology

## 2022-03-15 LAB — HOMOCYSTEINE: Homocysteine: 15.9 umol/L — ABNORMAL HIGH (ref <11.4)

## 2022-03-15 MED ORDER — APIXABAN 5 MG PO TABS: 5.0000 mg | ORAL_TABLET | Freq: Two times a day (BID) | ORAL | 5 refills | Status: DC

## 2022-03-15 NOTE — Telephone Encounter (Signed)
Pls let him know I sent in Rx for Eliquis 5mg  BID to [facility] on [address]. Thanks

## 2022-03-15 NOTE — Telephone Encounter (Signed)
Pt called in stating he has called multiple pharmacies and they cannot get the dabigatran until [facility] September. He is wanting to see if there is something different he might could take?

## 2022-03-17 LAB — PROTEIN C AG + PROTEIN S AG
Protein C Antigen: 101 % (ref 60–150)
Protein S Ag, Free: 132 % (ref 61–136)
Protein S Ag, Total: 103 % (ref 60–150)

## 2022-03-17 LAB — ANA W/REFLEX: Anti Nuclear Antibody (ANA): NEGATIVE

## 2022-03-17 LAB — FACTOR V LEIDEN

## 2022-03-17 LAB — SPECIMEN STATUS REPORT

## 2022-03-18 ENCOUNTER — Telehealth: Payer: Self-pay | Admitting: Physician Assistant

## 2022-03-18 NOTE — Telephone Encounter (Signed)
Patient advised.

## 2022-03-18 NOTE — Telephone Encounter (Signed)
Scheduled appt per 8/23 referral. Called pt, no answer. Left msg with appt date/time. Requested for pt to call back to confirm appt.

## 2022-03-19 LAB — PROTHROMBIN GENE MUTATION: PROTHROMBIN (FACTOR II): NEGATIVE

## 2022-03-21 DIAGNOSIS — R7989 Other specified abnormal findings of blood chemistry: Secondary | ICD-10-CM | POA: Diagnosis not present

## 2022-03-21 DIAGNOSIS — M25511 Pain in right shoulder: Secondary | ICD-10-CM | POA: Diagnosis not present

## 2022-03-21 DIAGNOSIS — G08 Intracranial and intraspinal phlebitis and thrombophlebitis: Secondary | ICD-10-CM | POA: Diagnosis not present

## 2022-03-21 LAB — FACTOR V LEIDEN

## 2022-03-26 ENCOUNTER — Other Ambulatory Visit: Payer: Self-pay

## 2022-03-26 ENCOUNTER — Inpatient Hospital Stay: Payer: BC Managed Care – PPO | Attending: Physician Assistant | Admitting: Physician Assistant

## 2022-03-26 VITALS — BP 130/86 | HR 54 | Temp 98.1°F | Wt 214.2 lb

## 2022-03-26 DIAGNOSIS — Z801 Family history of malignant neoplasm of trachea, bronchus and lung: Secondary | ICD-10-CM

## 2022-03-26 DIAGNOSIS — Z7901 Long term (current) use of anticoagulants: Secondary | ICD-10-CM | POA: Diagnosis not present

## 2022-03-26 DIAGNOSIS — G08 Intracranial and intraspinal phlebitis and thrombophlebitis: Secondary | ICD-10-CM | POA: Insufficient documentation

## 2022-03-26 DIAGNOSIS — I8290 Acute embolism and thrombosis of unspecified vein: Secondary | ICD-10-CM

## 2022-03-26 NOTE — Progress Notes (Signed)
[place] [phone]   Fax:([phone]) [place] NOTE  Patient Care Team: Deland Pretty, MD as PCP - General (Internal Medicine)  Hematological/Oncological History 1) 02/02/2022: Presented with nocturnal convulsion 2) 03/04/2022: MRI brain showed abnormal signal within a relatively prominent vein that runs obliquely along the lateral left cerebral hemisphere, consistent with cortical vein thrombosis. 3) 03/07/2022:  -CT venogram was normal.  -Started on Lovenox injection then bridge to Pradaxa 150 mg BID -Hypercoagulable workup: Unremarkable except for mild elevation of homocysteine level 15.9 umol/L, antithrombin III function at 126% 4) 03/15/2022: Switched to Eliquis 5 mg BID as pharmacy did not have Pradaxa on formulary.  5) 03/26/2022: Establish care with [facility]ematology   CHIEF COMPLAINTS/PURPOSE OF CONSULTATION:  Cortical vein thrombosis  HISTORY OF PRESENTING ILLNESS:  Paul [name]. 51 y.o. male presents to the hematology clinic for recent diagnosis of cortical vein thrombosis. He is accompanied by his wife for this visit.   On exam today, Paul [name] reports that he is tolerating Eliquis without any toxicities. He denies any bruising or signs of bleeding. He denies any seizures or other neurologic symptoms since the episode on 02/02/2022. He reports his energy levels are at baseline and he is able to complete all his ADLs on his own. He denies headaches, dizziness, syncopal episodes or vision changes. He has no other complaints and rest of the 10 point ROS is below.   MEDICAL HISTORY:  Past Medical History:  Diagnosis Date   Abdominal pain    Cholelithiasis    History of kidney stones    Insomnia    Syncope     SURGICAL HISTORY: Past Surgical History:  Procedure Laterality Date   CHOLECYSTECTOMY N/A 11/20/2012   Procedure: LAPAROSCOPIC CHOLECYSTECTOMY converted to open cholecystectomy with  CHOLANGIOGRAM;  Surgeon: Odis Hollingshead, MD;  Location: [facility];  Service: General;  Laterality: N/A;   ERCP N/A 11/22/2012   Procedure: ENDOSCOPIC RETROGRADE CHOLANGIOPANCREATOGRAPHY (ERCP);  Surgeon: Jeryl Columbia, MD;  Location: [facility];  Service: Endoscopy;  Laterality: N/A;   ERCP N/A 04/21/2013   Procedure: ENDOSCOPIC RETROGRADE CHOLANGIOPANCREATOGRAPHY (ERCP);  Surgeon: Jeryl Columbia, MD;  Location: [facility];  Service: Endoscopy;  Laterality: N/A;   ESOPHAGOGASTRODUODENOSCOPY N/A 01/01/2013   Procedure: ESOPHAGOGASTRODUODENOSCOPY (EGD);  Surgeon: Jeryl Columbia, MD;  Location: [name];  Service: Endoscopy;  Laterality: N/A;   EXTRACORPOREAL SHOCK WAVE LITHOTRIPSY Right 12/31/2021   Procedure: EXTRACORPOREAL SHOCK WAVE LITHOTRIPSY (ESWL);  Surgeon: Festus Aloe, MD;  Location: [facility];  Service: Urology;  Laterality: Right;   HERNIA REPAIR     Childhood./ inguinal   WISDOM TOOTH EXTRACTION      SOCIAL HISTORY: Social History   Socioeconomic History   Marital status: Married    Spouse name: Not on file   Number of children: 3   Years of education: college   Highest education level: Not on file  Occupational History   Occupation: sales  Tobacco Use   Smoking status: Never   Smokeless tobacco: Never  Substance and Sexual Activity   Alcohol use: Yes    Comment: 1-2 drinks/week all of the above   Drug use: Never   Sexual activity: Never  Other Topics Concern   Not on file  Social History Narrative   Lives at home with family.two story home   Right-handed.   Caffeine use: 2 cups per day.   Social Determinants of Health   Financial Resource Strain: Not on  file  Food Insecurity: Not on file  Transportation Needs: Not on file  Physical Activity: Not on file  Stress: Not on file  Social Connections: Not on file  Intimate Partner Violence: Not on file    FAMILY HISTORY: Family History  Problem Relation Age of Onset   Healthy Mother    Lung cancer Father     ALLERGIES:   is allergic to dilaudid [hydromorphone hcl], dilaudid [hydromorphone], and lunesta [eszopiclone].  MEDICATIONS:  Current Outpatient Medications  Medication Sig Dispense Refill   apixaban (ELIQUIS) 5 MG TABS tablet Take 1 tablet (5 mg total) by mouth 2 (two) times daily. 60 tablet 5   Ascorbic Acid (VITAMIN C) 500 MG CHEW 1 tablet     levETIRAcetam (KEPPRA) 500 MG tablet Take 1 tablet twice a day 60 tablet 11   zaleplon (SONATA) 10 MG capsule 1 capsule at bedtime as needed     acetaminophen (TYLENOL) 325 MG tablet Take 2 tablets (650 mg total) by mouth every 6 (six) hours as needed for up to 30 doses for moderate pain or mild pain. (Patient not taking: Reported on 03/26/2022) 30 tablet 0   acetaminophen (TYLENOL) 500 MG tablet Take 500 mg by mouth as needed for pain. (Patient not taking: Reported on 03/26/2022)     Ascorbic Acid (VITAMIN C) 500 MG CHEW 1 tablet Orally as needed for 30 day(s)     No current facility-administered medications for this visit.    REVIEW OF SYSTEMS:   Constitutional: ( - ) fevers, ( - )  chills , ( - ) night sweats Eyes: ( - ) blurriness of vision, ( - ) double vision, ( - ) watery eyes Ears, nose, mouth, throat, and face: ( - ) mucositis, ( - ) sore throat Respiratory: ( - ) cough, ( - ) dyspnea, ( - ) wheezes Cardiovascular: ( - ) palpitation, ( - ) chest discomfort, ( - ) lower extremity swelling Gastrointestinal:  ( - ) nausea, ( - ) heartburn, ( - ) change in bowel habits Skin: ( - ) abnormal skin rashes Lymphatics: ( - ) new lymphadenopathy, ( - ) easy bruising Neurological: ( - ) numbness, ( - ) tingling, ( - ) new weaknesses Behavioral/Psych: ( - ) mood change, ( - ) new changes  All other systems were reviewed with the patient and are negative.  PHYSICAL EXAMINATION: ECOG PERFORMANCE STATUS: 0 - Asymptomatic  Vitals:   03/26/22 1544  BP: 130/86  Pulse: (!) 54  Temp: 98.1 F (36.7 C)  SpO2: 97%   Filed Weights   03/26/22 1544  Weight: 214 lb  3.2 oz (97.2 kg)    GENERAL: well appearing male in NAD  SKIN: skin color, texture, turgor are normal, no rashes or significant lesions EYES: conjunctiva are pink and non-injected, sclera clear LUNGS: clear to auscultation and percussion with normal breathing effort HEART: regular rate & rhythm and no murmurs and no lower extremity edema ABDOMEN: soft, non-tender, non-distended, normal bowel sounds Musculoskeletal: no cyanosis of digits and no clubbing  PSYCH: alert & oriented x 3, fluent speech NEURO: no focal motor/sensory deficits  LABORATORY DATA:  I have reviewed the data as listed    Latest Ref Rng & Units 03/07/2022    4:31 PM 02/02/2022    6:42 AM 12/26/2021    9:42 AM  CBC  WBC 4.0 - 10.5 K/uL 6.7  7.2  12.4   Hemoglobin 13.0 - 17.0 g/dL 02.7  25.3  12.5  Hematocrit 39.0 - 52.0 % 41.9  38.6  35.7   Platelets 150 - 400 K/uL 178  167  166        Latest Ref Rng & Units 03/07/2022    4:31 PM 02/02/2022    6:42 AM 12/26/2021    9:42 AM  CMP  Glucose 70 - 99 mg/dL 88  103  145   BUN 6 - 20 mg/dL 13  15  18    Creatinine 0.61 - 1.24 mg/dL 1.22  1.27  1.68   Sodium 135 - 145 mmol/L 138  141  140   Potassium 3.5 - 5.1 mmol/L 3.7  4.3  4.0   Chloride 98 - 111 mmol/L 106  106  107   CO2 22 - 32 mmol/L 21  21  23    Calcium 8.9 - 10.3 mg/dL 9.9  9.9  10.1   Total Protein 6.5 - 8.1 g/dL 7.5  6.7    Total Bilirubin 0.3 - 1.2 mg/dL 1.0  0.7    Alkaline Phos 38 - 126 U/L 50  41    AST 15 - 41 U/L 33  26    ALT 0 - 44 U/L 46  30       RADIOGRAPHIC STUDIES: I have personally reviewed the radiological images as listed and agreed with the findings in the report. CT VENOGRAM HEAD  Result Date: 03/07/2022 CLINICAL DATA:  Possible dural venous sinus thrombosis EXAM: CT VENOGRAM HEAD TECHNIQUE: Venographic phase images of the brain were obtained following the administration of intravenous contrast. Multiplanar reformats and maximum intensity projections were generated. RADIATION DOSE  REDUCTION: This exam was performed according to the departmental dose-optimization program which includes automated exposure control, adjustment of the mA and/or kV according to patient size and/or use of iterative reconstruction technique. CONTRAST:  62mL OMNIPAQUE IOHEXOL 350 MG/ML SOLN COMPARISON:  None Available. FINDINGS: Brain: There is no mass, hemorrhage or extra-axial collection. The size and configuration of the ventricles and extra-axial CSF spaces are normal. The brain parenchyma is normal, without acute or chronic infarction. Vascular: No abnormal hyperdensity of the major intracranial arteries or dural venous sinuses. No intracranial atherosclerosis. Superior sagittal sinus: Normal. Straight sinus: Normal. Inferior sagittal sinus, vein of Galen and internal cerebral veins: Normal. Transverse sinuses: Normal. Sigmoid sinuses: Normal. Visualized jugular veins: Normal. No cortical venous thrombosis is visible. Skull: The visualized skull base, calvarium and extracranial soft tissues are normal. Sinuses/Orbits: No fluid levels or advanced mucosal thickening of the visualized paranasal sinuses. No mastoid or middle ear effusion. The orbits are normal. IMPRESSION: Normal CT venogram. Electronically Signed   By: Ulyses Jarred M.D.   On: 03/07/2022 19:21   EEG adult  Result Date: 03/05/2022 Cameron Sprang, MD     03/13/2022 12:51 PM ELECTROENCEPHALOGRAM REPORT Date of Study: 03/05/2022 Patient's Name: Paul [name]. MRN: [id number] Date of Birth: 11/17/1970 Referring Provider: Dr. Ellouise Newer Clinical History: This is a 51 year old man with new onset nocturnal convulsion. EEG for classification. Medications: Keppra Technical Summary: A multichannel digital EEG recording measured by the international 10-20 system with electrodes applied with paste and impedances below 5000 ohms performed in our laboratory with EKG monitoring in an awake and drowsy patient.  Hyperventilation and photic stimulation were  performed.  The digital EEG was referentially recorded, reformatted, and digitally filtered in a variety of bipolar and referential montages for optimal display.  Description: The patient is awake and drowsy during the recording.  During maximal wakefulness, there is  a symmetric, medium voltage 10 Hz posterior dominant rhythm that attenuates with eye opening.  The record is symmetric.  During drowsiness, there is an increase in theta slowing of the background.  Sleep was not captured. Hyperventilation and photic stimulation did not elicit any abnormalities.  There were no epileptiform discharges or electrographic seizures seen.  EKG lead was unremarkable. Impression: This awake and drowsy EEG is normal.  Clinical Correlation: A normal EEG does not exclude a clinical diagnosis of epilepsy.  If further clinical questions remain, prolonged EEG may be helpful.  Clinical correlation is advised. Patrcia Dolly, M.D.    ASSESSMENT & PLAN Paul [name]. Is a 51 y.o. male diagnosed with cortical vein thrombosis. We reviewed possible etiologies for his CVTs including pregnancy, oral contraceptives, malignancy, inflammatory conditions, and clotting disorders.  Patient underwent hypercoagulable work-up on 03/07/2022 that ruled out antiphospholipid syndrome, protein C and S deficiencies, factor V Leiden mutation and prothrombin gene mutation. There was mild elevation of Antithrombin III function measuring 126% and homocysteine level measuring 15.9 umol/L.  These levels are not elevated enough to meet criteria for a clotting disorder.  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   Patient is currently on Eliquis 5 mg twice daily as his pharmacy did not have Pradaxa in formulary. The anticoagulant agent of choice which is Pradaxa 150 mg twice daily. We recommend duration of anticoagulation therapy is up to 12 months in unprovoked cases.  Recommend for patient to return in  approximately 6 months with labs.  We will check with Paul [name] pharmacies if Pradaxa is available, if so we will recommend to switch from Eliquis.   Patient will return in 6 months with repeat labs to see Dr. Leonides Schanz.   No orders of the defined types were placed in this encounter.   All questions were answered. The patient knows to call the clinic with any problems, questions or concerns.  I have spent a total of 60 minutes minutes of face-to-face and non-face-to-face time, preparing to see the patient, obtaining and/or reviewing separately obtained history, performing a medically appropriate examination, counseling and educating the patient, documenting clinical information in the electronic health record, and care coordination.   Georga Kaufmann, PA-C Department of Hematology/Oncology [name] at [facility] Phone: [phone]

## 2022-03-27 ENCOUNTER — Other Ambulatory Visit (HOSPITAL_COMMUNITY): Payer: Self-pay

## 2022-03-27 ENCOUNTER — Telehealth: Payer: Self-pay | Admitting: Hematology and Oncology

## 2022-03-27 ENCOUNTER — Other Ambulatory Visit: Payer: Self-pay | Admitting: Physician Assistant

## 2022-03-27 LAB — D-DIMER, QUANTITATIVE: D-Dimer, Quant: <0.19 mcg/mL FEU (ref <0.50)

## 2022-03-27 LAB — ANTIPHOSPHOLIPID SYNDROME DIAGNOSTIC PANEL
Anticardiolipin IgA: <2 APL-U/mL (ref <20.0)
Anticardiolipin IgG: <2 GPL-U/mL (ref <20.0)
Anticardiolipin IgM: <2 MPL-U/mL (ref <20.0)
Beta-2 Glyco 1 IgA: <2 U/mL (ref <20.0)
Beta-2 Glyco 1 IgM: 2.5 U/mL (ref <20.0)
Beta-2 Glyco I IgG: <2 U/mL (ref <20.0)
PTT-LA Screen: 34 s (ref <=40)
dRVVT: 35 s (ref <=45)

## 2022-03-27 LAB — PROTHROMBIN GENE MUTATION

## 2022-03-27 MED ORDER — DABIGATRAN ETEXILATE MESYLATE 150 MG PO CAPS
150.0000 mg | ORAL_CAPSULE | Freq: Two times a day (BID) | ORAL | 6 refills | Status: DC
  Filled 2022-03-27 – 2022-03-28 (×2): qty 60, 30d supply, fill #0
  Filled 2022-04-29: qty 60, 30d supply, fill #1
  Filled 2022-05-30: qty 60, 30d supply, fill #2
  Filled 2022-06-30 – 2022-07-16 (×4): qty 60, 30d supply, fill #3
  Filled 2022-08-03: qty 60, 30d supply, fill #4
  Filled 2022-09-10: qty 60, 30d supply, fill #5

## 2022-03-27 NOTE — Telephone Encounter (Signed)
Per 9/5 los called and spoke to pt about appointment  

## 2022-03-28 ENCOUNTER — Other Ambulatory Visit (HOSPITAL_COMMUNITY): Payer: Self-pay

## 2022-03-29 ENCOUNTER — Other Ambulatory Visit (HOSPITAL_COMMUNITY): Payer: Self-pay

## 2022-04-01 ENCOUNTER — Other Ambulatory Visit (HOSPITAL_COMMUNITY): Payer: Self-pay

## 2022-04-03 ENCOUNTER — Other Ambulatory Visit (HOSPITAL_COMMUNITY): Payer: Self-pay

## 2022-04-05 NOTE — Progress Notes (Unsigned)
   I, Philbert Riser, LAT, ATC acting as a scribe for Clementeen Graham, MD.  Subjective:    CC: R shoulder pain  HPI: Pt is a 51 y/o male c/o bilat shoulder pain, mostly R. Pt notes he had a seizure in July and has a compression fx at T12. Pt is unsure if he may have injured his shoulders at that time. Pt locates pain to multiple locations depending on motion.   Radiates: yes- into R hand w/ contracture UE Numbness/tingling: no UE Weakness: yes- unscrewing bottle caps, cutting food Aggravates: horz add, ER, IR Treatments tried: none  Dx imaging: 11/29/08 C-spine MRI  Pertinent review of Systems: No fevers or chills  Relevant historical information: T-spine vertebral compression fracture at T12.  This occurred about 60 days ago.  Currently wearing TLSO brace for another 30 days.   Objective:    Vitals:   04/08/22 1115  BP: 130/84  Pulse: (!) 54  SpO2: 97%   General: Well Developed, well nourished, and in no acute distress.   MSK: Right shoulder: Normal-appearing Normal motion pain with abduction.  Positive Hawkins and Neer's test.  Positive empty can test. Negative Yergason's and speeds test. Intact strength in resisted abduction.  Normal cervical motion.  Upper extremity strength is intact.  Lab and Radiology Results  Diagnostic Limited MSK Ultrasound of: Right shoulder Biceps tendon intact normal. Subscapularis tendon intact. Supraspinatus tendon is intact with moderate subacromial bursitis present. Infraspinatus tendon is intact. AC joint normal-appearing Impression: Subacromial bursitis    Impression and Recommendations:    Assessment and Plan: 51 y.o. male with right shoulder pain.  Pain is thought to be due to subacromial bursitis and shoulder impingement.  This occurs in the setting of a fall resulting in T12 vertebral compression fracture thought to be due to a seizure ultimately due to cerebral venous thrombosis.  I am not sure if the shoulder has anything  to do with those other issues but the fact that he is wearing a TLSO brace probably is contributory.  Plan for trial of physical therapy and check back in 6 weeks.  If needed could proceed with steroid injection.  Would like to avoid steroid injection was try to heal this compression fracture.Marland Kitchen  PDMP not reviewed this encounter. Orders Placed This Encounter  Procedures   [place] LIMITED JOINT SPACE STRUCTURES UP RIGHT(NO LINKED CHARGES)    Order Specific Question:   Reason for Exam (SYMPTOM  OR DIAGNOSIS REQUIRED)    Answer:   right shoulder pain    Order Specific Question:   Preferred imaging location?    Answer:   [facility] referral to Physical Therapy    Referral Priority:   Routine    Referral Type:   Physical Medicine    Referral Reason:   Specialty Services Required    Requested Specialty:   Physical Therapy    Number of Visits Requested:   1   No orders of the defined types were placed in this encounter.   Discussed warning signs or symptoms. Please see discharge instructions. Patient expresses understanding.   The above documentation has been reviewed and is accurate and complete Clementeen Graham, M.D.

## 2022-04-08 ENCOUNTER — Ambulatory Visit (INDEPENDENT_AMBULATORY_CARE_PROVIDER_SITE_OTHER): Payer: BC Managed Care – PPO | Admitting: Family Medicine

## 2022-04-08 ENCOUNTER — Ambulatory Visit: Payer: Self-pay

## 2022-04-08 VITALS — BP 130/84 | HR 54 | Ht 70.0 in | Wt 219.0 lb

## 2022-04-08 DIAGNOSIS — G8929 Other chronic pain: Secondary | ICD-10-CM | POA: Diagnosis not present

## 2022-04-08 DIAGNOSIS — M25511 Pain in right shoulder: Secondary | ICD-10-CM

## 2022-04-08 NOTE — Patient Instructions (Signed)
Thank you for coming in today.   I've referred you to Physical Therapy.  Let us know if you don't hear from them in one week.   Recheck in 6 weeks.   I can do an injection at any time.

## 2022-04-09 NOTE — Therapy (Unsigned)
OUTPATIENT PHYSICAL THERAPY SHOULDER EVALUATION   Patient Name: Paul [name]. MRN: [id number] DOB:August 07, 1970, 51 y.o., male Today's Date: 04/10/2022   PT End of Session - 04/10/22 1627     Visit Number 1    Number of Visits 16    Date for PT Re-Evaluation 06/05/22    PT Start Time 1404    PT Stop Time 1448    PT Time Calculation (min) 44 min    Activity Tolerance Patient tolerated treatment well             Past Medical History:  Diagnosis Date   Abdominal pain    Cholelithiasis    History of kidney stones    Insomnia    Syncope    Past Surgical History:  Procedure Laterality Date   CHOLECYSTECTOMY N/A 11/20/2012   Procedure: LAPAROSCOPIC CHOLECYSTECTOMY converted to open cholecystectomy with  CHOLANGIOGRAM;  Surgeon: Adolph Pollack, MD;  Location: [facility];  Service: General;  Laterality: N/A;   ERCP N/A 11/22/2012   Procedure: ENDOSCOPIC RETROGRADE CHOLANGIOPANCREATOGRAPHY (ERCP);  Surgeon: Petra Kuba, MD;  Location: [facility];  Service: Endoscopy;  Laterality: N/A;   ERCP N/A 04/21/2013   Procedure: ENDOSCOPIC RETROGRADE CHOLANGIOPANCREATOGRAPHY (ERCP);  Surgeon: Petra Kuba, MD;  Location: [facility];  Service: Endoscopy;  Laterality: N/A;   ESOPHAGOGASTRODUODENOSCOPY N/A 01/01/2013   Procedure: ESOPHAGOGASTRODUODENOSCOPY (EGD);  Surgeon: Petra Kuba, MD;  Location: [facility];  Service: Endoscopy;  Laterality: N/A;   EXTRACORPOREAL SHOCK WAVE LITHOTRIPSY Right 12/31/2021   Procedure: EXTRACORPOREAL SHOCK WAVE LITHOTRIPSY (ESWL);  Surgeon: Jerilee Field, MD;  Location: [facility];  Service: Urology;  Laterality: Right;   HERNIA REPAIR     Childhood./ inguinal   WISDOM TOOTH EXTRACTION     Patient Active Problem List   Diagnosis Date Noted   Cerebral venous thrombosis of cortical vein 03/26/2022   Confusion 11/28/2020   Partial symptomatic epilepsy with complex partial seizures, not intractable, without status epilepticus (HCC) 11/28/2020    Choledocholithiasis 04/19/2013   CAP (community acquired pneumonia) 04/19/2013   Dehydration 04/19/2013   Cholecystitis, acute s/p open cholecystectomy 11/20/12 11/02/2012    PCP: Dr Merri Brunette  REFERRING PROVIDER: Dr Clementeen Graham  REFERRING DIAG: Chronic Rt shoulder pain   THERAPY DIAG:  Chronic right shoulder pain  Other symptoms and signs involving the musculoskeletal system  Abnormal posture  Rationale for Evaluation and Treatment Rehabilitation  ONSET DATE: 02/02/22  SUBJECTIVE:  SUBJECTIVE STATEMENT: Patient reports that he slipped catching himself with his Rt UE. He had pain for a few days then resolved for the most part. He had a seizure 7/23 sustaining fracture of T12. He has noticed increased pain in both shoulders the past 2 months Rt > Lt. Sleeping in a recliner in TLSO. He returns to MD at [facility] 04/16/22 and hopes to go into a "smaller brace"  PERTINENT HISTORY: Seizure 7/23 with compression fx T12 in TLSO brace for ~ 30 more days - has been in brace for ~ 60 days.  PAIN:  Are you having pain? Yes: NPRS scale: 0/10 at rest 5-6/10 with movement  Pain location: shoulders Rt > Lt  Pain description: sharp at times  Aggravating factors: lifting arm up and out to the side; reaching; pushing up to get out of a chair; opening bottles with push caps; pushing down cutting food; push ups  Relieving factors: rest; no movement of arms   PRECAUTIONS: None  WEIGHT BEARING RESTRICTIONS No  FALLS:  Has patient fallen in last 6 months? 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 Was traveling for job with local sales but not traveling now due to seizures   PLOF: Independent  PATIENT GOALS get rid of shoulder pain   OBJECTIVE:   DIAGNOSTIC FINDINGS:  CT of head 03/07/22 - Normal CT venogram. CT Lumbar spine - 1. Acute T12 superior endplate compression fracture with 20% loss of vertebral body height, new since last month. No significant retropulsion or other complicating features.2. Stable CT appearance of the lumbar spine. Chronic L5-S1 spondylolysis and spondylolisthesis. Chronic SI joint ankylosis.3. Resolved right side obstructive uropathy.  Left nephrolithiasis.  PATIENT SURVEYS:  FOTO 49  COGNITION:  Overall cognitive status: Within functional limits for tasks assessed     SENSATION: WFL  POSTURE: Head forward; shoulders rounded and elevated; head of the humerus anterior in orientation; scapulae abducted and rotated along the thoracic wall   UPPER EXTREMITY ROM: pain with all Rt shoulder ROM; tightness and discomfort with all Lt shoulder movements   Active ROM Right eval Left eval  Shoulder flexion 152 155  Shoulder extension 74 56  Shoulder abduction 160 158  Shoulder adduction    Shoulder internal rotation Thumb T5 Thumb T4  Shoulder external rotation 88 90  Elbow flexion    Elbow extension    Wrist flexion    Wrist extension    Wrist ulnar deviation    Wrist radial deviation    Wrist pronation    Wrist supination    (Blank rows = not tested)  UPPER EXTREMITY MMT:  strength not tested due to T12 fx currently under treatment and in TLSO  MMT Right eval Left eval  Shoulder flexion    Shoulder extension    Shoulder abduction    Shoulder adduction    Shoulder internal rotation    Shoulder external rotation    Middle trapezius    Lower trapezius    Elbow flexion    Elbow extension    Wrist flexion    Wrist extension    Wrist ulnar deviation    Wrist radial deviation    Wrist pronation    Wrist supination    Grip  strength (lbs)    (Blank rows =  not tested)   PALPATION:  Muscular tightness in the Rt > Lt shoulder girdle including pecs; upper traps; leveator; deltoid; biceps; cervical musculature   TODAY'S TREATMENT:  Postural correction Education re shoulder pathology  Exercises; Chin tuck 5 sec x 5 Scap squeeze 5 sec x 5  L's x 10 W's x 10  Doorway stretch 3 positions 30 sec x 1 rep each     PATIENT EDUCATION: Education details: POC; HEP Person educated: Patient Education method: Explanation, Demonstration, Tactile cues, Verbal cues, and Handouts Education comprehension: verbalized understanding, returned demonstration, verbal cues required, tactile cues required, and needs further education   HOME EXERCISE PROGRAM: CLINICAccess Code: [email or web address] URL: [email or web address] Date: 04/10/2022 Prepared by: Gillermo Murdoch  Exercises - Seated Cervical Retraction  - 3 x daily - 7 x weekly - 1 sets - 10 reps - 5-10 sec  hold - Standing Scapular Retraction  - 3 x daily - 7 x weekly - 1 sets - 10 reps - 10 sec  hold - Shoulder External Rotation and Scapular Retraction  - 3 x daily - 7 x weekly - 1 sets - 10 reps - 3 sec   hold - Shoulder External Rotation in 45 Degrees Abduction  - 2 x daily - 7 x weekly - 1-2 sets - 10 reps - 3 sec  hold - Doorway Pec Stretch at 60 Degrees Abduction  - 3 x daily - 7 x weekly - 1 sets - 3 reps - Doorway Pec Stretch at 90 Degrees Abduction  - 3 x daily - 7 x weekly - 1 sets - 3 reps - 30 seconds  hold - Doorway Pec Stretch at 120 Degrees Abduction  - 3 x daily - 7 x weekly - 1 sets - 3 reps - 30 second hold  hold  Patient Education - TENS Unit - Office Posture  ASSESSMENT:  CLINICAL IMPRESSION: Patient is a 51 y.o. male who was seen today for physical therapy evaluation and treatment for Rt > Lt shoulder pain and cervical radiculopathy. He is currently being treated for T12 fracture and is in a TLSO brace with limited activities following fx when  having a seizure 7/23. (No further seizure activity) Patient has rounded shoulder posture and alignment; limited cervical and bilat shoulder ROM; muscular tightness to palpation; pain on a daily basis. Patient will benefit from PT to address problems identified.    OBJECTIVE IMPAIRMENTS decreased activity tolerance, decreased mobility, decreased ROM, decreased strength, increased muscle spasms, impaired flexibility, impaired sensation, impaired UE functional use, improper body mechanics, postural dysfunction, and pain.   ACTIVITY LIMITATIONS carrying, lifting, bending, sleeping, bed mobility, and reach over head  PARTICIPATION LIMITATIONS: driving, occupation, and yard work  PERSONAL FACTORS Past/current experiences, Time since onset of injury/illness/exacerbation, and 1-2 comorbidities: History of Rt shoulder pain x ~ 2 years; Fx 12 currently in TLSO; seizure resulting in thoracic fracture are also affecting patient's functional outcome.   REHAB POTENTIAL: Good  CLINICAL DECISION MAKING: Stable/uncomplicated  EVALUATION COMPLEXITY: Low   GOALS: Goals reviewed with patient?  yes   LONG TERM GOALS: Target date: 06/05/2022    Improve posture and alignment with patient to demonstrate improved upright posture with posterior shoulder girdle engaged Baseline:  Goal status: INITIAL  2.  Increase AROM Rt shoulder to =/> than AROM Lt shoulder  Baseline:  Goal status: INITIAL  3.  Decrease bilat shoulder pain by 50-75% Baseline:  Goal status: INITIAL  4.  Patient to report ability to transfer in and out of  chairs and turn/roll in bed Baseline:  Goal status: INITIAL  5.  Independent in HEP including aquatic program as indicated  Baseline:  Goal status: INITIAL  6.  Improve functional limitation score to 65 Baseline: 49 Goal status: INITIAL   PLAN: PT FREQUENCY: 2x/week  PT DURATION: 8 weeks  PLANNED INTERVENTIONS: Therapeutic exercises, Therapeutic activity, Neuromuscular  re-education, Patient/Family education, Self Care, Joint mobilization, Aquatic Therapy, Dry Needling, Electrical stimulation, Cryotherapy, Moist heat, Taping, Vasopneumatic device, and Ultrasound  PLAN FOR NEXT SESSION: review and progress exercises; continue postural correction and education; modalities as indicated    Paul [name] Rober MinionP Devinne Epstein, PT, MPH 04/10/2022, 4:41 PM

## 2022-04-10 ENCOUNTER — Ambulatory Visit: Payer: BC Managed Care – PPO | Attending: Family Medicine | Admitting: Rehabilitative and Restorative Service Providers"

## 2022-04-10 ENCOUNTER — Encounter: Payer: Self-pay | Admitting: Rehabilitative and Restorative Service Providers"

## 2022-04-10 ENCOUNTER — Other Ambulatory Visit: Payer: Self-pay

## 2022-04-10 DIAGNOSIS — M539 Dorsopathy, unspecified: Secondary | ICD-10-CM

## 2022-04-10 DIAGNOSIS — M25511 Pain in right shoulder: Secondary | ICD-10-CM | POA: Insufficient documentation

## 2022-04-10 DIAGNOSIS — G8929 Other chronic pain: Secondary | ICD-10-CM | POA: Diagnosis not present

## 2022-04-10 DIAGNOSIS — R29898 Other symptoms and signs involving the musculoskeletal system: Secondary | ICD-10-CM

## 2022-04-10 DIAGNOSIS — R293 Abnormal posture: Secondary | ICD-10-CM

## 2022-04-16 ENCOUNTER — Encounter: Payer: Self-pay | Admitting: Physical Therapy

## 2022-04-16 ENCOUNTER — Ambulatory Visit: Payer: BC Managed Care – PPO | Admitting: Physical Therapy

## 2022-04-16 DIAGNOSIS — R293 Abnormal posture: Secondary | ICD-10-CM

## 2022-04-16 DIAGNOSIS — G8929 Other chronic pain: Secondary | ICD-10-CM

## 2022-04-16 DIAGNOSIS — R29898 Other symptoms and signs involving the musculoskeletal system: Secondary | ICD-10-CM

## 2022-04-16 DIAGNOSIS — S22080A Wedge compression fracture of T11-T12 vertebra, initial encounter for closed fracture: Secondary | ICD-10-CM | POA: Diagnosis not present

## 2022-04-16 DIAGNOSIS — M539 Dorsopathy, unspecified: Secondary | ICD-10-CM

## 2022-04-16 DIAGNOSIS — Z6831 Body mass index (BMI) 31.0-31.9, adult: Secondary | ICD-10-CM | POA: Diagnosis not present

## 2022-04-16 DIAGNOSIS — M25511 Pain in right shoulder: Secondary | ICD-10-CM | POA: Diagnosis not present

## 2022-04-16 NOTE — Therapy (Signed)
OUTPATIENT PHYSICAL THERAPY TREATMENT   Patient Name: Paul [name]. MRN: [id number] DOB:03-13-1971, 51 y.o., male Today's Date: 04/16/2022   PT End of Session - 04/16/22 1619     Visit Number 2    Number of Visits 16    Date for PT Re-Evaluation 06/05/22    PT Start Time 1619    PT Stop Time 1700    PT Time Calculation (min) 41 min    Activity Tolerance Patient tolerated treatment well    Behavior During Therapy WFL for tasks assessed/performed             Past Medical History:  Diagnosis Date   Abdominal pain    Cholelithiasis    History of kidney stones    Insomnia    Syncope    Past Surgical History:  Procedure Laterality Date   CHOLECYSTECTOMY N/A 11/20/2012   Procedure: LAPAROSCOPIC CHOLECYSTECTOMY converted to open cholecystectomy with  CHOLANGIOGRAM;  Surgeon: Odis Hollingshead, MD;  Location: [facility];  Service: General;  Laterality: N/A;   ERCP N/A 11/22/2012   Procedure: ENDOSCOPIC RETROGRADE CHOLANGIOPANCREATOGRAPHY (ERCP);  Surgeon: Jeryl Columbia, MD;  Location: [facility];  Service: Endoscopy;  Laterality: N/A;   ERCP N/A 04/21/2013   Procedure: ENDOSCOPIC RETROGRADE CHOLANGIOPANCREATOGRAPHY (ERCP);  Surgeon: Jeryl Columbia, MD;  Location: [facility];  Service: Endoscopy;  Laterality: N/A;   ESOPHAGOGASTRODUODENOSCOPY N/A 01/01/2013   Procedure: ESOPHAGOGASTRODUODENOSCOPY (EGD);  Surgeon: Jeryl Columbia, MD;  [facility];  Service: Endoscopy;  Laterality: N/A;   EXTRACORPOREAL SHOCK WAVE LITHOTRIPSY Right 12/31/2021   Procedure: EXTRACORPOREAL SHOCK WAVE LITHOTRIPSY (ESWL);  Surgeon: Festus Aloe, MD;  Location: [facility];  Service: Urology;  Laterality: Right;   HERNIA REPAIR     Childhood./ inguinal   WISDOM TOOTH EXTRACTION     Patient Active Problem List   Diagnosis Date Noted   Cerebral venous thrombosis of cortical vein 03/26/2022   Confusion 11/28/2020   Partial symptomatic epilepsy with complex partial seizures, not  intractable, without status epilepticus ([place]) 11/28/2020   Choledocholithiasis 04/19/2013   CAP (community acquired pneumonia) 04/19/2013   Dehydration 04/19/2013   Cholecystitis, acute s/p open cholecystectomy 11/20/12 11/02/2012    PCP: Dr Deland Pretty  REFERRING PROVIDER: Dr Lynne Leader  REFERRING DIAG: Chronic Rt shoulder pain   THERAPY DIAG:  Chronic right shoulder pain  Other symptoms and signs involving the musculoskeletal system  Abnormal posture  Cervical dysfunction  Rationale for Evaluation and Treatment Rehabilitation  ONSET DATE: 02/02/22  SUBJECTIVE:  SUBJECTIVE STATEMENT: 04/16/22: Pt states he will have to be in TLSO for 4 more weeks. Pt reports he got the thoracic part out. Pt does state that the fracture is healing. Reports some pain in low back the night after PT session -- not sure if it was due to trying to sleep on the bed for the first time or the exercises but he could feel it with movement.   From eval: Patient reports that he slipped catching himself with his Rt UE. He had pain for a few days then resolved for the most part. He had a seizure 7/23 sustaining fracture of T12. He has noticed increased pain in both shoulders the past 2 months Rt > Lt. Sleeping in a recliner in TLSO. He returns to MD at [facility] 04/16/22 and hopes to go into a "smaller brace"  PERTINENT HISTORY: Seizure 7/23 with compression fx T12 in TLSO brace for ~ 30 more days - has been in brace for ~ 60 days.  PAIN:  Are you having pain? Yes: NPRS scale: 0/10 at rest 5-6/10 with movement  Pain location: shoulders Rt > Lt  Pain description: sharp at times  Aggravating factors: lifting arm up and out to the side; reaching; pushing up to get out of a chair; opening bottles with push caps; pushing down cutting  food; push ups  Relieving factors: rest; no movement of arms   PRECAUTIONS: 20 lb lifting limit  WEIGHT BEARING RESTRICTIONS No  FALLS:  Has patient fallen in last 6 months? 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 CT Lumbar spine - 1. Acute T12 superior endplate compression fracture with 20% loss of vertebral body height, new since last month. No significant retropulsion or other complicating features.2. Stable CT appearance of the lumbar spine. Chronic L5-S1 spondylolysis and spondylolisthesis. Chronic SI joint ankylosis.3. Resolved right side obstructive uropathy.  Left nephrolithiasis.  PATIENT SURVEYS:  FOTO 49  COGNITION:  Overall cognitive status: Within functional limits for tasks assessed     SENSATION: WFL  POSTURE: Head forward; shoulders rounded and elevated; head of the humerus anterior in orientation; scapulae abducted and rotated along the thoracic wall   UPPER EXTREMITY ROM: pain with all Rt shoulder ROM; tightness and discomfort with all Lt shoulder movements   Active ROM Right eval Left eval  Shoulder flexion 152 155  Shoulder extension 74 56  Shoulder abduction 160 158  Shoulder adduction    Shoulder internal rotation Thumb T5 Thumb T4  Shoulder external rotation 88 90  Elbow flexion    Elbow extension    Wrist flexion    Wrist extension    Wrist ulnar deviation    Wrist radial deviation    Wrist pronation    Wrist supination    (Blank rows = not tested)  UPPER EXTREMITY MMT:  strength not tested due to  T12 fx currently under treatment and in TLSO  MMT Right eval Left eval  Shoulder flexion    Shoulder extension    Shoulder abduction    Shoulder  adduction    Shoulder internal rotation    Shoulder external rotation    Middle trapezius    Lower trapezius    Elbow flexion    Elbow extension    Wrist flexion    Wrist extension    Wrist ulnar deviation    Wrist radial deviation    Wrist pronation    Wrist supination    Grip strength (lbs)    (Blank rows = not tested)   PALPATION:  Muscular tightness in the Rt > Lt shoulder girdle including pecs; upper traps; leveator; deltoid; biceps; cervical musculature   TODAY'S TREATMENT:  04/16/22: THEREX: Pulleys flexion x2 min warm up, scaption x 2 min warm up Standing  Doorway stretch 3 positions 2x30 sec   Against pool noodle:  Chin tuck 10 x 5 sec holds  Scap squeeze 10 x 5 sec hold  L's 2x10  W's 2x10  MANUAL THERAPY: STM & TPR UTs, suboccipitals, cervical paraspinals, deltoids, pecs  MODALITIES: TENS on R UT x 8 min to pt tolerance   From eval: Postural correction Education re shoulder pathology  Exercises; Chin tuck 5 sec x 5 Scap squeeze 5 sec x 5  L's x 10 W's x 10  Doorway stretch 3 positions 30 sec x 1 rep each     PATIENT EDUCATION: Education details: TENS, how to use, electrode placement, precautions Person educated: Patient Education method: Explanation, Demonstration, Tactile cues, Verbal cues, and Handouts Education comprehension: verbalized understanding, returned demonstration, verbal cues required, tactile cues required, and needs further education   HOME EXERCISE PROGRAM: CLINICAccess Code: [email or web address] URL: [email or web address] Date: 04/10/2022 Prepared by: Corlis Leak  Exercises - Seated Cervical Retraction  - 3 x daily - 7 x weekly - 1 sets - 10 reps - 5-10 sec  hold - Standing Scapular Retraction  - 3 x daily - 7 x weekly - 1 sets - 10 reps - 10 sec  hold - Shoulder  External Rotation and Scapular Retraction  - 3 x daily - 7 x weekly - 1 sets - 10 reps - 3 sec   hold - Shoulder External Rotation in 45 Degrees Abduction  - 2 x daily - 7 x weekly - 1-2 sets - 10 reps - 3 sec  hold - Doorway Pec Stretch at 60 Degrees Abduction  - 3 x daily - 7 x weekly - 1 sets - 3 reps - Doorway Pec Stretch at 90 Degrees Abduction  - 3 x daily - 7 x weekly - 1 sets - 3 reps - 30 seconds  hold - Doorway Pec Stretch at 120 Degrees Abduction  - 3 x daily - 7 x weekly - 1 sets - 3 reps - 30 second hold  hold  Patient Education - TENS Unit - Office Posture  ASSESSMENT:  CLINICAL IMPRESSION: 04/16/22: Session focused on reviewing HEP. Educated pt on use of TENS and demonstrated electrode placement.   From eval: Patient is a 51 y.o. male who was seen today for physical therapy evaluation and treatment for Rt > Lt shoulder pain and cervical radiculopathy. He is currently being treated for T12 fracture and is in a TLSO brace with limited activities following fx when having a seizure 7/23. (No further seizure activity) Patient has rounded shoulder posture and alignment; limited cervical and bilat shoulder ROM; muscular tightness to palpation; pain on a daily basis. Patient will benefit from PT to address problems identified.    OBJECTIVE IMPAIRMENTS decreased activity tolerance, decreased mobility, decreased ROM, decreased strength,  increased muscle spasms, impaired flexibility, impaired sensation, impaired UE functional use, improper body mechanics, postural dysfunction, and pain.   ACTIVITY LIMITATIONS carrying, lifting, bending, sleeping, bed mobility, and reach over head  PARTICIPATION LIMITATIONS: driving, occupation, and yard work  PERSONAL FACTORS Past/current experiences, Time since onset of injury/illness/exacerbation, and 1-2 comorbidities: History of Rt shoulder pain x ~ 2 years; Fx 12 currently in TLSO; seizure resulting in thoracic fracture are also affecting patient's  functional outcome.   REHAB POTENTIAL: Good  CLINICAL DECISION MAKING: Stable/uncomplicated  EVALUATION COMPLEXITY: Low   GOALS: Goals reviewed with patient?  yes   LONG TERM GOALS: Target date: 06/05/2022    Improve posture and alignment with patient to demonstrate improved upright posture with posterior shoulder girdle engaged Baseline:  Goal status: INITIAL  2.  Increase AROM Rt shoulder to =/> than AROM Lt shoulder  Baseline:  Goal status: INITIAL  3.  Decrease bilat shoulder pain by 50-75% Baseline:  Goal status: INITIAL  4.  Patient to report ability to transfer in and out of chairs and turn/roll in bed Baseline:  Goal status: INITIAL  5.  Independent in HEP including aquatic program as indicated  Baseline:  Goal status: INITIAL  6.  Improve functional limitation score to 65 Baseline: 49 Goal status: INITIAL   PLAN: PT FREQUENCY: 2x/week  PT DURATION: 8 weeks  PLANNED INTERVENTIONS: Therapeutic exercises, Therapeutic activity, Neuromuscular re-education, Patient/Family education, Self Care, Joint mobilization, Aquatic Therapy, Dry Needling, Electrical stimulation, Cryotherapy, Moist heat, Taping, Vasopneumatic device, and Ultrasound  PLAN FOR NEXT SESSION: review and progress exercises; continue postural correction and education; modalities as indicated    Chalyn Amescua April Ma L Kao Conry, PT, DPT 04/16/2022, 4:20 PM

## 2022-04-19 ENCOUNTER — Ambulatory Visit: Payer: BC Managed Care – PPO | Admitting: Physical Therapy

## 2022-04-19 ENCOUNTER — Other Ambulatory Visit: Payer: Self-pay | Admitting: Student

## 2022-04-19 ENCOUNTER — Encounter: Payer: Self-pay | Admitting: Physical Therapy

## 2022-04-19 DIAGNOSIS — M539 Dorsopathy, unspecified: Secondary | ICD-10-CM

## 2022-04-19 DIAGNOSIS — R29898 Other symptoms and signs involving the musculoskeletal system: Secondary | ICD-10-CM

## 2022-04-19 DIAGNOSIS — G8929 Other chronic pain: Secondary | ICD-10-CM | POA: Diagnosis not present

## 2022-04-19 DIAGNOSIS — M25511 Pain in right shoulder: Secondary | ICD-10-CM | POA: Diagnosis not present

## 2022-04-19 DIAGNOSIS — R293 Abnormal posture: Secondary | ICD-10-CM

## 2022-04-19 DIAGNOSIS — S22080A Wedge compression fracture of T11-T12 vertebra, initial encounter for closed fracture: Secondary | ICD-10-CM

## 2022-04-19 NOTE — Therapy (Signed)
OUTPATIENT PHYSICAL THERAPY TREATMENT   Patient Name: Paul [name]. MRN: [id number] DOB:1971-05-08, 51 y.o., male Today's Date: 04/19/2022   PT End of Session - 04/19/22 0803     Visit Number 3    Number of Visits 16    Date for PT Re-Evaluation 06/05/22    PT Start Time 0803    PT Stop Time 0845    PT Time Calculation (min) 42 min    Activity Tolerance Patient tolerated treatment well    Behavior During Therapy [facility] for tasks assessed/performed             Past Medical History:  Diagnosis Date   Abdominal pain    Cholelithiasis    History of kidney stones    Insomnia    Syncope    Past Surgical History:  Procedure Laterality Date   CHOLECYSTECTOMY N/A 11/20/2012   Procedure: LAPAROSCOPIC CHOLECYSTECTOMY converted to open cholecystectomy with  CHOLANGIOGRAM;  Surgeon: Odis Hollingshead, MD;  Location: [facility];  Service: General;  Laterality: N/A;   ERCP N/A 11/22/2012   Procedure: ENDOSCOPIC RETROGRADE CHOLANGIOPANCREATOGRAPHY (ERCP);  Surgeon: Jeryl Columbia, MD;  Location: [facility];  Service: Endoscopy;  Laterality: N/A;   ERCP N/A 04/21/2013   Procedure: ENDOSCOPIC RETROGRADE CHOLANGIOPANCREATOGRAPHY (ERCP);  Surgeon: Jeryl Columbia, MD;  Location: [facility];  Service: Endoscopy;  Laterality: N/A;   ESOPHAGOGASTRODUODENOSCOPY N/A 01/01/2013   Procedure: ESOPHAGOGASTRODUODENOSCOPY (EGD);  Surgeon: Jeryl Columbia, MD;  Location: [facility];  Service: Endoscopy;  Laterality: N/A;   EXTRACORPOREAL SHOCK WAVE LITHOTRIPSY Right 12/31/2021   Procedure: EXTRACORPOREAL SHOCK WAVE LITHOTRIPSY (ESWL);  Surgeon: Festus Aloe, MD;  Location: [facility];  Service: Urology;  Laterality: Right;   HERNIA REPAIR     Childhood./ inguinal   WISDOM TOOTH EXTRACTION     Patient Active Problem List   Diagnosis Date Noted   Cerebral venous thrombosis of cortical vein 03/26/2022   Confusion 11/28/2020   Partial symptomatic epilepsy with complex partial seizures, not  intractable, without status epilepticus ([place]) 11/28/2020   Choledocholithiasis 04/19/2013   CAP (community acquired pneumonia) 04/19/2013   Dehydration 04/19/2013   Cholecystitis, acute s/p open cholecystectomy 11/20/12 11/02/2012    PCP: Dr Deland Pretty  REFERRING PROVIDER: Dr Lynne Leader  REFERRING DIAG: Chronic Rt shoulder pain   THERAPY DIAG:  Chronic right shoulder pain  Other symptoms and signs involving the musculoskeletal system  Abnormal posture  Cervical dysfunction  Rationale for Evaluation and Treatment Rehabilitation  ONSET DATE: 02/02/22  SUBJECTIVE:  SUBJECTIVE STATEMENT: 04/19/22: Pt reports nothing new or different. Pt states he has attempted to sleep in the bed again -- pt is getting soreness in his back but it is more comfortable. Shoulder pain remains on the outside of his shoulder.   From eval: Patient reports that he slipped catching himself with his Rt UE. He had pain for a few days then resolved for the most part. He had a seizure 7/23 sustaining fracture of T12. He has noticed increased pain in both shoulders the past 2 months Rt > Lt. Sleeping in a recliner in [place]. He returns to MD at [place] 04/16/22 and hopes to go into a "smaller brace"  PERTINENT HISTORY: Seizure 7/23 with compression fx T12 in TLSO brace for ~ 30 more days - has been in brace for ~ 60 days.  PAIN:  Are you having pain? Yes: NPRS scale: 0/10 at rest 5-6/10 with movement  Pain location: shoulders Rt > Lt  Pain description: sharp at times  Aggravating factors: lifting arm up and out to the side; reaching; pushing up to get out of a chair; opening bottles with push caps; pushing down cutting food; push ups  Relieving factors: rest; no movement of arms   PRECAUTIONS: 20 lb lifting limit  WEIGHT  BEARING RESTRICTIONS No  FALLS:  Has patient fallen in last 6 months? 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 CT Lumbar spine - 1. Acute T12 superior endplate compression fracture with 20% loss of vertebral body height, new since last month. No significant retropulsion or other complicating features.2. Stable CT appearance of the lumbar spine. Chronic L5-S1 spondylolysis and spondylolisthesis. Chronic SI joint ankylosis.3. Resolved right side obstructive uropathy.  Left nephrolithiasis.  PATIENT SURVEYS:  FOTO 49  COGNITION:  Overall cognitive status: Within functional limits for tasks assessed     SENSATION: WFL  POSTURE: Head forward; shoulders rounded and elevated; head of the humerus anterior in orientation; scapulae abducted and rotated along the thoracic wall   UPPER EXTREMITY ROM: pain with all Rt shoulder ROM; tightness and discomfort with all Lt shoulder movements   Active ROM Right eval Left eval  Shoulder flexion 152 155  Shoulder extension 74 56  Shoulder abduction 160 158  Shoulder adduction    Shoulder internal rotation Thumb T5 Thumb T4  Shoulder external rotation 88 90  Elbow flexion    Elbow extension    Wrist flexion    Wrist extension    Wrist ulnar deviation    Wrist radial deviation    Wrist pronation    Wrist supination    (Blank rows = not tested)  UPPER EXTREMITY MMT:  strength not tested due to T12 fx currently under treatment and in TLSO  MMT Right eval Left eval  Shoulder flexion    Shoulder  extension    Shoulder abduction    Shoulder adduction    Shoulder internal rotation    Shoulder external rotation    Middle trapezius    Lower trapezius    Elbow flexion  Elbow extension    Wrist flexion    Wrist extension    Wrist ulnar deviation    Wrist radial deviation    Wrist pronation    Wrist supination    Grip strength (lbs)    (Blank rows = not tested)   PALPATION:  Muscular tightness in the Rt > Lt shoulder girdle including pecs; upper traps; leveator; deltoid; biceps; cervical musculature   TODAY'S TREATMENT:  04/19/22: THEREX: UBE L1; 2 min fwd, 2 min bwd for warm up Standing  Doorway stretch 3 positions 2 x30 sec  Sitting  UT stretch x30 sec R&L  Levator stretch x30 sec R&L  MANUAL THERAPY:  STM & TPR delts, pecs, UTs  Scapular mobilizations all directions  R GH mobs anterior and inferior grade II to III  NMED  Prone "I", "W", "T" x10 each R&L working on reducing deltoid and UT overactivity    04/16/22: THEREX: Pulleys flexion x2 min warm up, scaption x 2 min warm up Standing  Doorway stretch 3 positions 2x30 sec   Against pool noodle:  Chin tuck 10 x 5 sec holds  Scap squeeze 10 x 5 sec hold  L's 2x10  W's 2x10  MANUAL THERAPY: STM & TPR UTs, suboccipitals, cervical paraspinals, deltoids, pecs  MODALITIES: TENS on R UT x 8 min to pt tolerance   From eval: Postural correction Education re shoulder pathology  Exercises; Chin tuck 5 sec x 5 Scap squeeze 5 sec x 5  L's x 10 W's x 10  Doorway stretch 3 positions 30 sec x 1 rep each     PATIENT EDUCATION: Education details: TENS, how to use, electrode placement, precautions Person educated: Patient Education method: Explanation, Demonstration, Tactile cues, Verbal cues, and Handouts Education comprehension: verbalized understanding, returned demonstration, verbal cues required, tactile cues required, and needs further education   HOME EXERCISE PROGRAM: CLINICAccess Code:  [email or web address] URL: [email or web address] Date: 04/10/2022 Prepared by: Gillermo Murdoch  Exercises - Seated Cervical Retraction  - 3 x daily - 7 x weekly - 1 sets - 10 reps - 5-10 sec  hold - Standing Scapular Retraction  - 3 x daily - 7 x weekly - 1 sets - 10 reps - 10 sec  hold - Shoulder External Rotation and Scapular Retraction  - 3 x daily - 7 x weekly - 1 sets - 10 reps - 3 sec   hold - Shoulder External Rotation in 45 Degrees Abduction  - 2 x daily - 7 x weekly - 1-2 sets - 10 reps - 3 sec  hold - Doorway Pec Stretch at 60 Degrees Abduction  - 3 x daily - 7 x weekly - 1 sets - 3 reps - Doorway Pec Stretch at 90 Degrees Abduction  - 3 x daily - 7 x weekly - 1 sets - 3 reps - 30 seconds  hold - Doorway Pec Stretch at 120 Degrees Abduction  - 3 x daily - 7 x weekly - 1 sets - 3 reps - 30 second hold  hold  Patient Education - TENS Unit - Office Posture  ASSESSMENT:  CLINICAL IMPRESSION: 04/19/22: Session focused on continuing to improve tissue extensibility to decrease anterior tilt of bilat scapula. Worked on neuro re-ed to decrease UT and deltoid activity.   From eval: Patient is a 51 y.o. male who was seen today for physical therapy evaluation and treatment for Rt > Lt shoulder pain and cervical radiculopathy. He is currently being treated for T12 fracture and is in a  TLSO brace with limited activities following fx when having a seizure 7/23. (No further seizure activity) Patient has rounded shoulder posture and alignment; limited cervical and bilat shoulder ROM; muscular tightness to palpation; pain on a daily basis. Patient will benefit from PT to address problems identified.    OBJECTIVE IMPAIRMENTS decreased activity tolerance, decreased mobility, decreased ROM, decreased strength, increased muscle spasms, impaired flexibility, impaired sensation, impaired UE functional use, improper body mechanics, postural dysfunction, and pain.   ACTIVITY LIMITATIONS carrying, lifting,  bending, sleeping, bed mobility, and reach over head  PARTICIPATION LIMITATIONS: driving, occupation, and yard work  PERSONAL FACTORS Past/current experiences, Time since onset of injury/illness/exacerbation, and 1-2 comorbidities: History of Rt shoulder pain x ~ 2 years; Fx 12 currently in TLSO; seizure resulting in thoracic fracture are also affecting patient's functional outcome.   REHAB POTENTIAL: Good  CLINICAL DECISION MAKING: Stable/uncomplicated  EVALUATION COMPLEXITY: Low   GOALS: Goals reviewed with patient?  yes   LONG TERM GOALS: Target date: 06/05/2022    Improve posture and alignment with patient to demonstrate improved upright posture with posterior shoulder girdle engaged Baseline:  Goal status: INITIAL  2.  Increase AROM Rt shoulder to =/> than AROM Lt shoulder  Baseline:  Goal status: INITIAL  3.  Decrease bilat shoulder pain by 50-75% Baseline:  Goal status: INITIAL  4.  Patient to report ability to transfer in and out of chairs and turn/roll in bed Baseline:  Goal status: INITIAL  5.  Independent in HEP including aquatic program as indicated  Baseline:  Goal status: INITIAL  6.  Improve functional limitation score to 65 Baseline: 49 Goal status: INITIAL   PLAN: PT FREQUENCY: 2x/week  PT DURATION: 8 weeks  PLANNED INTERVENTIONS: Therapeutic exercises, Therapeutic activity, Neuromuscular re-education, Patient/Family education, Self Care, Joint mobilization, Aquatic Therapy, Dry Needling, Electrical stimulation, Cryotherapy, Moist heat, Taping, Vasopneumatic device, and Ultrasound  PLAN FOR NEXT SESSION: review and progress exercises; continue postural correction and education; modalities as indicated    Risha Barretta April Ma L Bronte Kropf, PT, DPT 04/19/2022, 8:03 AM

## 2022-04-23 ENCOUNTER — Ambulatory Visit: Payer: BC Managed Care – PPO | Attending: Family Medicine | Admitting: Physical Therapy

## 2022-04-23 ENCOUNTER — Encounter: Payer: Self-pay | Admitting: Physical Therapy

## 2022-04-23 DIAGNOSIS — M539 Dorsopathy, unspecified: Secondary | ICD-10-CM | POA: Diagnosis not present

## 2022-04-23 DIAGNOSIS — R29898 Other symptoms and signs involving the musculoskeletal system: Secondary | ICD-10-CM | POA: Insufficient documentation

## 2022-04-23 DIAGNOSIS — M25511 Pain in right shoulder: Secondary | ICD-10-CM | POA: Insufficient documentation

## 2022-04-23 DIAGNOSIS — R293 Abnormal posture: Secondary | ICD-10-CM | POA: Diagnosis not present

## 2022-04-23 DIAGNOSIS — G8929 Other chronic pain: Secondary | ICD-10-CM | POA: Insufficient documentation

## 2022-04-23 NOTE — Therapy (Signed)
OUTPATIENT PHYSICAL THERAPY TREATMENT   Patient Name: Paul [name]. MRN: [id number] DOB:1971/07/04, 51 y.o., male Today's Date: 04/23/2022   PT End of Session - 04/23/22 1626     Visit Number 4    Number of Visits 16    Date for PT Re-Evaluation 06/05/22    PT Start Time 1620    PT Stop Time 1700    PT Time Calculation (min) 40 min    Activity Tolerance Patient tolerated treatment well    Behavior During Therapy WFL for tasks assessed/performed             Past Medical History:  Diagnosis Date   Abdominal pain    Cholelithiasis    History of kidney stones    Insomnia    Syncope    Past Surgical History:  Procedure Laterality Date   CHOLECYSTECTOMY N/A 11/20/2012   Procedure: LAPAROSCOPIC CHOLECYSTECTOMY converted to open cholecystectomy with  CHOLANGIOGRAM;  Surgeon: Odis Hollingshead, MD;  Location: [facility];  Service: General;  Laterality: N/A;   ERCP N/A 11/22/2012   Procedure: ENDOSCOPIC RETROGRADE CHOLANGIOPANCREATOGRAPHY (ERCP);  Surgeon: Jeryl Columbia, MD;  Location: [facility];  Service: Endoscopy;  Laterality: N/A;   ERCP N/A 04/21/2013   Procedure: ENDOSCOPIC RETROGRADE CHOLANGIOPANCREATOGRAPHY (ERCP);  Surgeon: Jeryl Columbia, MD;  Location: [facility];  Service: Endoscopy;  Laterality: N/A;   ESOPHAGOGASTRODUODENOSCOPY N/A 01/01/2013   Procedure: ESOPHAGOGASTRODUODENOSCOPY (EGD);  Surgeon: Jeryl Columbia, MD;  Location: [facility] END[facility]COPY;  Service: Endoscopy;  Laterality: N/A;   EXTRACORPOREAL SHOCK WAVE LITHOTRIPSY Right 12/31/2021   Procedure: EXTRACORPOREAL SHOCK WAVE LITHOTRIPSY (ESWL);  Surgeon: Festus Aloe, MD;  Location: [facility];  Service: Urology;  Laterality: Right;   HERNIA REPAIR     Childhood./ inguinal   WISDOM TOOTH EXTRACTION     Patient Active Problem List   Diagnosis Date Noted   Cerebral venous thrombosis of cortical vein 03/26/2022   Confusion 11/28/2020   Partial symptomatic epilepsy with complex partial seizures, not  intractable, without status epilepticus ([place]) 11/28/2020   Choledocholithiasis 04/19/2013   CAP (community acquired pneumonia) 04/19/2013   Dehydration 04/19/2013   Cholecystitis, acute s/p open cholecystectomy 11/20/12 11/02/2012    PCP: Dr Deland Pretty  REFERRING PROVIDER: Dr Lynne Leader  REFERRING DIAG: Chronic Rt shoulder pain   THERAPY DIAG:  Chronic right shoulder pain  Other symptoms and signs involving the musculoskeletal system  Abnormal posture  Cervical dysfunction  Rationale for Evaluation and Treatment Rehabilitation  ONSET DATE: 02/02/22  SUBJECTIVE:  SUBJECTIVE STATEMENT: 04/23/22: Pt states he can still feel his deltoid muscle tightening/pain on top of his shoulder. Has been doing the prone exercises on the floor but not the same as doing it on the mat table in the clinic.   From eval: Patient reports that he slipped catching himself with his Rt UE. He had pain for a few days then resolved for the most part. He had a seizure 7/23 sustaining fracture of T12. He has noticed increased pain in both shoulders the past 2 months Rt > Lt. Sleeping in a recliner in [place]. He returns to MD at [place] 04/16/22 and hopes to go into a "smaller brace"  PERTINENT HISTORY: Seizure 7/23 with compression fx T12 in TLSO brace for ~ 30 more days - has been in brace for ~ 60 days.  PAIN:  Are you having pain? Yes: NPRS scale: 1/10 Pain location: shoulders Rt > Lt  Pain description: "fatigue" Aggravating factors: lifting arm up and out to the side; reaching; pushing up to get out of a chair; opening bottles with push caps; pushing down cutting food; push ups  Relieving factors: rest; no movement of arms   PRECAUTIONS: 20 lb lifting limit  WEIGHT BEARING RESTRICTIONS No  FALLS:  Has patient fallen  in last 6 months? 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 CT Lumbar spine - 1. Acute T12 superior endplate compression fracture with 20% loss of vertebral body height, new since last month. No significant retropulsion or other complicating features.2. Stable CT appearance of the lumbar spine. Chronic L5-S1 spondylolysis and spondylolisthesis. Chronic SI joint ankylosis.3. Resolved right side obstructive uropathy.  Left nephrolithiasis.  PATIENT SURVEYS:  FOTO 49  COGNITION:  Overall cognitive status: Within functional limits for tasks assessed     SENSATION: WFL  POSTURE: Head forward; shoulders rounded and elevated; head of the humerus anterior in orientation; scapulae abducted and rotated along the thoracic wall   UPPER EXTREMITY ROM: pain with all Rt shoulder ROM; tightness and discomfort with all Lt shoulder movements   Active ROM Right eval Left eval  Shoulder flexion 152 155  Shoulder extension 74 56  Shoulder abduction 160 158  Shoulder adduction    Shoulder internal rotation Thumb T5 Thumb T4  Shoulder external rotation 88 90  Elbow flexion    Elbow extension    Wrist flexion    Wrist extension    Wrist ulnar deviation    Wrist radial deviation    Wrist pronation    Wrist supination    (Blank rows = not tested)  UPPER EXTREMITY MMT:  strength not tested due to T12 fx currently under treatment and in TLSO  MMT Right eval Left eval  Shoulder flexion    Shoulder extension    Shoulder abduction    Shoulder adduction     Shoulder internal rotation    Shoulder external rotation    Middle trapezius    Lower trapezius    Elbow flexion    Elbow extension    Wrist flexion  Wrist extension    Wrist ulnar deviation    Wrist radial deviation    Wrist pronation    Wrist supination    Grip strength (lbs)    (Blank rows = not tested)   PALPATION:  Muscular tightness in the Rt > Lt shoulder girdle including pecs; upper traps; leveator; deltoid; biceps; cervical musculature   TODAY'S TREATMENT:  04/23/22 THEREX UBE L3; 2 min fwd, 2 min bwd for warm up  Prone "I", "W", "Y" x10 each  Standing against pool noodle "L" x 10 Shoulder ER red TB bilat 2x10 "W" red TB bilat 2x10  MANUAL THERAPY Skilled assessment and palpation for TPDN STM & TPR deltoid, UT, bicep Trigger Point Dry-Needling  Treatment instructions: Expect mild to moderate muscle soreness. S/S of pneumothorax if dry needled over a lung field, and to seek immediate medical attention should they occur. Patient verbalized understanding of these instructions and education.  Patient Consent Given: Yes Education handout provided: Previously provided Muscles treated: mid and ant deltoid, UT, supraspinatus Electrical stimulation performed: No Parameters: N/A Treatment response/outcome: Twitch     04/19/22: THEREX: UBE L1; 2 min fwd, 2 min bwd for warm up Standing  Doorway stretch 3 positions 2 x30 sec  Sitting  UT stretch x30 sec R&L  Levator stretch x30 sec R&L  MANUAL THERAPY:  STM & TPR delts, pecs, UTs  Scapular mobilizations all directions  R GH mobs anterior and inferior grade II to III  NMED  Prone "I", "W", "T" x10 each R&L working on reducing deltoid and UT overactivity    04/16/22: THEREX: Pulleys flexion x2 min warm up, scaption x 2 min warm up Standing  Doorway stretch 3 positions 2x30 sec   Against pool noodle:  Chin tuck 10 x 5 sec holds  Scap squeeze 10 x 5 sec hold  L's 2x10  W's 2x10  MANUAL  THERAPY: STM & TPR UTs, suboccipitals, cervical paraspinals, deltoids, pecs  MODALITIES: TENS on R UT x 8 min to pt tolerance   From eval: Postural correction Education re shoulder pathology  Exercises; Chin tuck 5 sec x 5 Scap squeeze 5 sec x 5  L's x 10 W's x 10  Doorway stretch 3 positions 30 sec x 1 rep each     PATIENT EDUCATION: Education details: TENS, how to use, electrode placement, precautions Person educated: Patient Education method: Explanation, Demonstration, Tactile cues, Verbal cues, and Handouts Education comprehension: verbalized understanding, returned demonstration, verbal cues required, tactile cues required, and needs further education   HOME EXERCISE PROGRAM: CLINICAccess Code: [email or web address] URL: [email or web address] Date: 04/10/2022 Prepared by: Gillermo Murdoch  Exercises - Seated Cervical Retraction  - 3 x daily - 7 x weekly - 1 sets - 10 reps - 5-10 sec  hold - Standing Scapular Retraction  - 3 x daily - 7 x weekly - 1 sets - 10 reps - 10 sec  hold - Shoulder External Rotation and Scapular Retraction  - 3 x daily - 7 x weekly - 1 sets - 10 reps - 3 sec   hold - Shoulder External Rotation in 45 Degrees Abduction  - 2 x daily - 7 x weekly - 1-2 sets - 10 reps - 3 sec  hold - Doorway Pec Stretch at 60 Degrees Abduction  - 3 x daily - 7 x weekly - 1 sets - 3 reps - Doorway Pec Stretch at 90 Degrees Abduction  - 3 x daily - 7 x weekly - 1 sets - 3 reps -  30 seconds  hold - Doorway Pec Stretch at 120 Degrees Abduction  - 3 x daily - 7 x weekly - 1 sets - 3 reps - 30 second hold  hold  Patient Education - TENS Unit - Office Posture  ASSESSMENT:  CLINICAL IMPRESSION: 04/23/22: Performed trial of TPDN this session. Continued to work on improving mid deltoid/UT pain. Pt with improving midback/periscapular strengthening in prone without deltoid/UT compensation. Added resistance to standing exercises with some exacerbation of L shoulder tightening.    From eval: Patient is a 51 y.o. male who was seen today for physical therapy evaluation and treatment for Rt > Lt shoulder pain and cervical radiculopathy. He is currently being treated for T12 fracture and is in a TLSO brace with limited activities following fx when having a seizure 7/23. (No further seizure activity) Patient has rounded shoulder posture and alignment; limited cervical and bilat shoulder ROM; muscular tightness to palpation; pain on a daily basis. Patient will benefit from PT to address problems identified.    OBJECTIVE IMPAIRMENTS decreased activity tolerance, decreased mobility, decreased ROM, decreased strength, increased muscle spasms, impaired flexibility, impaired sensation, impaired UE functional use, improper body mechanics, postural dysfunction, and pain.   ACTIVITY LIMITATIONS carrying, lifting, bending, sleeping, bed mobility, and reach over head  PARTICIPATION LIMITATIONS: driving, occupation, and yard work  PERSONAL FACTORS Past/current experiences, Time since onset of injury/illness/exacerbation, and 1-2 comorbidities: History of Rt shoulder pain x ~ 2 years; Fx 12 currently in TLSO; seizure resulting in thoracic fracture are also affecting patient's functional outcome.   REHAB POTENTIAL: Good  CLINICAL DECISION MAKING: Stable/uncomplicated  EVALUATION COMPLEXITY: Low   GOALS: Goals reviewed with patient?  yes   LONG TERM GOALS: Target date: 06/05/2022    Improve posture and alignment with patient to demonstrate improved upright posture with posterior shoulder girdle engaged Baseline:  Goal status: INITIAL  2.  Increase AROM Rt shoulder to =/> than AROM Lt shoulder  Baseline:  Goal status: INITIAL  3.  Decrease bilat shoulder pain by 50-75% Baseline:  Goal status: INITIAL  4.  Patient to report ability to transfer in and out of chairs and turn/roll in bed Baseline:  Goal status: INITIAL  5.  Independent in HEP including aquatic program as  indicated  Baseline:  Goal status: INITIAL  6.  Improve functional limitation score to 65 Baseline: 49 Goal status: INITIAL   PLAN: PT FREQUENCY: 2x/week  PT DURATION: 8 weeks  PLANNED INTERVENTIONS: Therapeutic exercises, Therapeutic activity, Neuromuscular re-education, Patient/Family education, Self Care, Joint mobilization, Aquatic Therapy, Dry Needling, Electrical stimulation, Cryotherapy, Moist heat, Taping, Vasopneumatic device, and Ultrasound  PLAN FOR NEXT SESSION: R joint mobilizations -- trial of sleeper stretch. How was TPDN? review and progress exercises; continue postural correction and education; modalities as indicated    [facility] April Ma L [place], PT, DPT 04/23/2022, 4:27 PM

## 2022-04-26 ENCOUNTER — Ambulatory Visit: Payer: BC Managed Care – PPO | Admitting: Physical Therapy

## 2022-04-26 ENCOUNTER — Encounter: Payer: Self-pay | Admitting: Physical Therapy

## 2022-04-26 DIAGNOSIS — R29898 Other symptoms and signs involving the musculoskeletal system: Secondary | ICD-10-CM | POA: Diagnosis not present

## 2022-04-26 DIAGNOSIS — M25511 Pain in right shoulder: Secondary | ICD-10-CM | POA: Diagnosis not present

## 2022-04-26 DIAGNOSIS — G8929 Other chronic pain: Secondary | ICD-10-CM

## 2022-04-26 DIAGNOSIS — R293 Abnormal posture: Secondary | ICD-10-CM | POA: Diagnosis not present

## 2022-04-26 DIAGNOSIS — M539 Dorsopathy, unspecified: Secondary | ICD-10-CM | POA: Diagnosis not present

## 2022-04-26 NOTE — Therapy (Signed)
OUTPATIENT PHYSICAL THERAPY TREATMENT   Patient Name: Paul [name]. MRN: [id number] DOB:1970/12/12, 51 y.o., male Today's Date: 04/26/2022   PT End of Session - 04/26/22 0843     Visit Number 5    Number of Visits 16    Date for PT Re-Evaluation 06/05/22    PT Start Time 0800    PT Stop Time 0843    PT Time Calculation (min) 43 min    Activity Tolerance Patient tolerated treatment well    Behavior During Therapy [place] for tasks assessed/performed              Past Medical History:  Diagnosis Date   Abdominal pain    Cholelithiasis    History of kidney stones    Insomnia    Syncope    Past Surgical History:  Procedure Laterality Date   CHOLECYSTECTOMY N/A 11/20/2012   Procedure: LAPAROSCOPIC CHOLECYSTECTOMY converted to open cholecystectomy with  CHOLANGIOGRAM;  Surgeon: Odis Hollingshead, MD;  Location: [facility];  Service: General;  Laterality: N/A;   ERCP N/A 11/22/2012   Procedure: ENDOSCOPIC RETROGRADE CHOLANGIOPANCREATOGRAPHY (ERCP);  Surgeon: Jeryl Columbia, MD;  Location: [facility];  Service: Endoscopy;  Laterality: N/A;   ERCP N/A 04/21/2013   Procedure: ENDOSCOPIC RETROGRADE CHOLANGIOPANCREATOGRAPHY (ERCP);  Surgeon: Jeryl Columbia, MD;  Location: [facility];  Service: Endoscopy;  Laterality: N/A;   ESOPHAGOGASTRODUODENOSCOPY N/A 01/01/2013   Procedure: ESOPHAGOGASTRODUODENOSCOPY (EGD);  Surgeon: Jeryl Columbia, MD;  Location: [facility];  Service: Endoscopy;  Laterality: N/A;   EXTRACORPOREAL SHOCK WAVE LITHOTRIPSY Right 12/31/2021   Procedure: EXTRACORPOREAL SHOCK WAVE LITHOTRIPSY (ESWL);  Surgeon: Festus Aloe, MD;  Location[facility];  Service: Urology;  Laterality: Right;   HERNIA REPAIR     Childhood./ inguinal   WISDOM TOOTH EXTRACTION     Patient Active Problem List   Diagnosis Date Noted   Cerebral venous thrombosis of cortical vein 03/26/2022   Confusion 11/28/2020   Partial symptomatic epilepsy with complex partial seizures, not  intractable, without status epilepticus ([place]) 11/28/2020   Choledocholithiasis 04/19/2013   CAP (community acquired pneumonia) 04/19/2013   Dehydration 04/19/2013   Cholecystitis, acute s/p open cholecystectomy 11/20/12 11/02/2012    PCP: Dr Deland Pretty  REFERRING PROVIDER: Dr Lynne Leader  REFERRING DIAG: Chronic Rt shoulder pain   THERAPY DIAG:  Chronic right shoulder pain  Other symptoms and signs involving the musculoskeletal system  Abnormal posture  Rationale for Evaluation and Treatment Rehabilitation  ONSET DATE: 02/02/22  SUBJECTIVE:  SUBJECTIVE STATEMENT: Pt states he feels some tightness in his Rt shoulder, "maybe I slept funny". States he was sore after needling but feels he gained some ROM  PERTINENT HISTORY: Seizure 7/23 with compression fx T12 in TLSO brace for ~ 30 more days - has been in brace for ~ 60 days.  PAIN:  Are you having pain? Yes: NPRS scale: 1/10 Pain location: shoulders Rt > Lt  Pain description: "fatigue" Aggravating factors: lifting arm up and out to the side; reaching; pushing up to get out of a chair; opening bottles with push caps; pushing down cutting food; push ups  Relieving factors: rest; no movement of arms    PATIENT GOALS get rid of shoulder pain   OBJECTIVE:   UPPER EXTREMITY ROM: pain with all Rt shoulder ROM; tightness and discomfort with all Lt shoulder movements   Active ROM Right eval Left eval  Shoulder flexion 152 155  Shoulder extension 74 56  Shoulder abduction 160 158  Shoulder adduction    Shoulder internal rotation Thumb T5 Thumb T4  Shoulder external rotation 88 90  Elbow flexion    Elbow extension    Wrist flexion    Wrist extension    Wrist ulnar deviation    Wrist radial deviation    Wrist pronation    Wrist supination     (Blank rows = not tested)       TODAY'S TREATMENT:  04/26/22 UBE L3 x 4 min alt fwd/bkwd  Standing against pool noodle: Bilat ER red TB x 20 W red TB x 20 L  2 x 10  Scap clock x 10 bilat red TB Serratus wall slide red TB x 10  Prone Y, T, I 2 x 10  Manual: STM upper trap, deltoids bilat  Trigger Point Dry-Needling  Treatment instructions: Expect mild to moderate muscle soreness. S/S of pneumothorax if dry needled over a lung field, and to seek immediate medical attention should they occur. Patient verbalized understanding of these instructions and education.  Patient Consent Given: Yes Education handout provided: Previously provided Muscles treated: upper trap,deltoids bilat Electrical stimulation performed: No Parameters: N/A Treatment response/outcome: twitch response  04/23/22 THEREX UBE L3; 2 min fwd, 2 min bwd for warm up  Prone "I", "W", "Y" x10 each  Standing against pool noodle "L" x 10 Shoulder ER red TB bilat 2x10 "W" red TB bilat 2x10  MANUAL THERAPY Skilled assessment and palpation for TPDN STM & TPR deltoid, UT, bicep Trigger Point Dry-Needling  Treatment instructions: Expect mild to moderate muscle soreness. S/S of pneumothorax if dry needled over a lung field, and to seek immediate medical attention should they occur. Patient verbalized understanding of these instructions and education.  Patient Consent Given: Yes Education handout provided: Previously provided Muscles treated: mid and ant deltoid, UT, supraspinatus Electrical stimulation performed: No Parameters: N/A Treatment response/outcome: Twitch     04/19/22: THEREX: UBE L1; 2 min fwd, 2 min bwd for warm up Standing  Doorway stretch 3 positions 2 x30 sec  Sitting  UT stretch x30 sec R&L  Levator stretch x30 sec R&L  MANUAL THERAPY:  STM & TPR delts, pecs, UTs  Scapular mobilizations all directions  R GH mobs anterior and inferior grade II to III  NMED  Prone "I", "W",  "T" x10 each R&L working on reducing deltoid and UT overactivity     PATIENT EDUCATION: Education details: TENS, how to use, electrode placement, precautions Person educated: Patient Education method: Explanation, Demonstration, Tactile cues, Verbal cues,  and Handouts Education comprehension: verbalized understanding, returned demonstration, verbal cues required, tactile cues required, and needs further education   HOME EXERCISE PROGRAM: Access Code: [email or web address] URL: [email or web address] Date: 04/26/2022 Prepared by: Isabelle Course  Exercises - Shoulder External Rotation and Scapular Retraction  - 3 x daily - 7 x weekly - 1 sets - 10 reps - 3 sec   hold - Doorway Pec Stretch at 60 Degrees Abduction  - 3 x daily - 7 x weekly - 1 sets - 3 reps - Doorway Pec Stretch at 90 Degrees Abduction  - 3 x daily - 7 x weekly - 1 sets - 3 reps - 30 seconds  hold - Doorway Pec Stretch at 120 Degrees Abduction  - 3 x daily - 7 x weekly - 1 sets - 3 reps - 30 second hold  hold - Prone Scapular Slide with Shoulder Extension  - 1 x daily - 7 x weekly - 1 sets - 10 reps - Prone W Scapular Retraction  - 1 x daily - 7 x weekly - 1 sets - 10 reps - Prone Scapular Retraction Y  - 1 x daily - 7 x weekly - 1 sets - 10 reps - Wall Clock with Theraband  - 1 x daily - 7 x weekly - 1 sets - 10 reps - Serratus Activation at [place]  - 1 x daily - 7 x weekly - 2 sets - 10 reps  ASSESSMENT:  CLINICAL IMPRESSION: Added scap clock and serratus activation - pt fatigues quickly but able to perform with good form. Continued needling to decrease muscle spasticity and improve ROM   GOALS: Goals reviewed with patient?  yes   LONG TERM GOALS: Target date: 06/05/2022    Improve posture and alignment with patient to demonstrate improved upright posture with posterior shoulder girdle engaged Baseline:  Goal status: INITIAL  2.  Increase AROM Rt shoulder to =/> than AROM Lt shoulder  Baseline:  Goal  status: INITIAL  3.  Decrease bilat shoulder pain by 50-75% Baseline:  Goal status: INITIAL  4.  Patient to report ability to transfer in and out of chairs and turn/roll in bed Baseline:  Goal status: INITIAL  5.  Independent in HEP including aquatic program as indicated  Baseline:  Goal status: INITIAL  6.  Improve functional limitation score to 65 Baseline: 49 Goal status: INITIAL   PLAN: PT FREQUENCY: 2x/week  PT DURATION: 8 weeks  PLANNED INTERVENTIONS: Therapeutic exercises, Therapeutic activity, Neuromuscular re-education, Patient/Family education, Self Care, Joint mobilization, Aquatic Therapy, Dry Needling, Electrical stimulation, Cryotherapy, Moist heat, Taping, Vasopneumatic device, and Ultrasound  PLAN FOR NEXT SESSION: R joint mobilizations -- trial of sleeper stretch. How was TPDN? review and progress exercises; continue postural correction and education; modalities as indicated    Kimber Esterly, PT 04/26/2022, 8:44 AM

## 2022-04-30 ENCOUNTER — Other Ambulatory Visit (HOSPITAL_COMMUNITY): Payer: Self-pay

## 2022-04-30 ENCOUNTER — Ambulatory Visit: Payer: BC Managed Care – PPO | Admitting: Rehabilitative and Restorative Service Providers"

## 2022-04-30 ENCOUNTER — Encounter: Payer: Self-pay | Admitting: Rehabilitative and Restorative Service Providers"

## 2022-04-30 DIAGNOSIS — M539 Dorsopathy, unspecified: Secondary | ICD-10-CM | POA: Diagnosis not present

## 2022-04-30 DIAGNOSIS — R29898 Other symptoms and signs involving the musculoskeletal system: Secondary | ICD-10-CM | POA: Diagnosis not present

## 2022-04-30 DIAGNOSIS — G8929 Other chronic pain: Secondary | ICD-10-CM | POA: Diagnosis not present

## 2022-04-30 DIAGNOSIS — M25511 Pain in right shoulder: Secondary | ICD-10-CM | POA: Diagnosis not present

## 2022-04-30 DIAGNOSIS — R293 Abnormal posture: Secondary | ICD-10-CM | POA: Diagnosis not present

## 2022-04-30 NOTE — Therapy (Signed)
OUTPATIENT PHYSICAL THERAPY TREATMENT   Patient Name: Paul [name]. MRN: [id number] DOB:July 18, 1971, 51 y.o., male Today's Date: 04/30/2022   PT End of Session - 04/30/22 1624     Visit Number 6    Number of Visits 16    Date for PT Re-Evaluation 06/05/22    PT Start Time [id number]    PT Stop Time 1700    PT Time Calculation (min) 45 min    Activity Tolerance Patient tolerated treatment well              Past Medical History:  Diagnosis Date   Abdominal pain    Cholelithiasis    History of kidney stones    Insomnia    Syncope    Past Surgical History:  Procedure Laterality Date   CHOLECYSTECTOMY N/A 11/20/2012   Procedure: LAPAROSCOPIC CHOLECYSTECTOMY converted to open cholecystectomy with  CHOLANGIOGRAM;  Surgeon: Odis Hollingshead, MD;  Location: [facility];  Service: General;  Laterality: N/A;   ERCP N/A 11/22/2012   Procedure: ENDOSCOPIC RETROGRADE CHOLANGIOPANCREATOGRAPHY (ERCP);  Surgeon: Jeryl Columbia, MD;  Location: [facility];  Service: Endoscopy;  Laterality: N/A;   ERCP N/A 04/21/2013   Procedure: ENDOSCOPIC RETROGRADE CHOLANGIOPANCREATOGRAPHY (ERCP);  Surgeon: Jeryl Columbia, MD;  Location: [facility];  Service: Endoscopy;  Laterality: N/A;   ESOPHAGOGASTRODUODENOSCOPY N/A 01/01/2013   Procedure: ESOPHAGOGASTRODUODENOSCOPY (EGD);  Surgeon: Jeryl Columbia, MD;  Location: [facility];  Service: Endoscopy;  Laterality: N/A;   EXTRACORPOREAL SHOCK WAVE LITHOTRIPSY Right 12/31/2021   Procedure: EXTRACORPOREAL SHOCK WAVE LITHOTRIPSY (ESWL);  Surgeon: Festus Aloe, MD;  Location: [facility];  Service: Urology;  Laterality: Right;   HERNIA REPAIR     Childhood./ inguinal   WISDOM TOOTH EXTRACTION     Patient Active Problem List   Diagnosis Date Noted   Cerebral venous thrombosis of cortical vein 03/26/2022   Confusion 11/28/2020   Partial symptomatic epilepsy with complex partial seizures, not intractable, without status epilepticus ([place]) 11/28/2020    Choledocholithiasis 04/19/2013   CAP (community acquired pneumonia) 04/19/2013   Dehydration 04/19/2013   Cholecystitis, acute s/p open cholecystectomy 11/20/12 11/02/2012    PCP: Dr Deland Pretty  REFERRING PROVIDER: Dr Lynne Leader  REFERRING DIAG: Chronic Rt shoulder pain   THERAPY DIAG:  Chronic right shoulder pain  Other symptoms and signs involving the musculoskeletal system  Abnormal posture  Cervical dysfunction  Rationale for Evaluation and Treatment Rehabilitation  ONSET DATE: 02/02/22  SUBJECTIVE:  SUBJECTIVE STATEMENT: Pt states he feels some tightness in his Rt shoulder and some pain with certain movements. States he was sore after needling but feels he gained some ROM  PERTINENT HISTORY: Seizure 7/23 with compression fx T12 in TLSO brace for ~ 30 more days - has been in brace for ~ 60 days.  PAIN:  Are you having pain? Yes: NPRS scale: 3/10 Pain location: shoulders Rt > Lt  Pain description: "fatigue" Aggravating factors: lifting arm up and out to the side; reaching; pushing up to get out of a chair; opening bottles with push caps; pushing down cutting food; push ups; turning the lamp by the sofa on and off   Relieving factors: rest; no movement of arms    PATIENT GOALS get rid of shoulder pain   OBJECTIVE:   UPPER EXTREMITY ROM: pain with all Rt shoulder ROM; tightness and discomfort with all Lt shoulder movements   Active ROM Right eval Left eval  Shoulder flexion 152 155  Shoulder extension 74 56  Shoulder abduction 160 158  Shoulder adduction    Shoulder internal rotation Thumb T5 Thumb T4  Shoulder external rotation 88 90  Elbow flexion    Elbow extension    Wrist flexion    Wrist extension    Wrist ulnar deviation    Wrist radial deviation    Wrist pronation     Wrist supination    (Blank rows = not tested)       TODAY'S TREATMENT:  04/30/22:  Pulley flexion  ~ 2 min  Pulley scaption ~ 2 min UBE L4  x 4 min alternating fwd/back  Activation of core for HEP   Trigger Point Dry-Needling  Treatment instructions: Expect mild to moderate muscle soreness. S/S of pneumothorax if dry needled over a lung field, and to seek immediate medical attention should they occur. Patient verbalized understanding of these instructions and education.  Patient Consent Given: Yes Education handout provided: Previously provided Muscles treated: Lt upper trap; deltoids; biceps; pecs; teres Electrical stimulation performed: Yes Parameters: mAmp current x 5 min x 2 locations Treatment response/outcome: decreased palpable tightness    04/26/22 UBE L3 x 4 min alt fwd/bkwd  Standing against pool noodle: Bilat ER red TB x 20 W red TB x 20 L  2 x 10  Scap clock x 10 bilat red TB Serratus wall slide red TB x 10  Prone Y, T, I 2 x 10  Manual: STM upper trap, deltoids bilat  Trigger Point Dry-Needling  Treatment instructions: Expect mild to moderate muscle soreness. S/S of pneumothorax if dry needled over a lung field, and to seek immediate medical attention should they occur. Patient verbalized understanding of these instructions and education.  Patient Consent Given: Yes Education handout provided: Previously provided Muscles treated: upper trap,deltoids bilat Electrical stimulation performed: No Parameters: N/A Treatment response/outcome: twitch response  04/23/22 THEREX UBE L3; 2 min fwd, 2 min bwd for warm up  Prone "I", "W", "Y" x10 each  Standing against pool noodle "L" x 10 Shoulder ER red TB bilat 2x10 "W" red TB bilat 2x10  MANUAL THERAPY Skilled assessment and palpation for TPDN STM & TPR deltoid, UT, bicep Trigger Point Dry-Needling  Treatment instructions: Expect mild to moderate muscle soreness. S/S of pneumothorax if dry needled  over a lung field, and to seek immediate medical attention should they occur. Patient verbalized understanding of these instructions and education.  Patient Consent Given: Yes Education handout provided: Previously provided Muscles treated: mid and ant  deltoid, UT, supraspinatus Electrical stimulation performed: No Parameters: N/A Treatment response/outcome: Twitch     04/19/22: THEREX: UBE L1; 2 min fwd, 2 min bwd for warm up Standing  Doorway stretch 3 positions 2 x30 sec  Sitting  UT stretch x30 sec R&L  Levator stretch x30 sec R&L  MANUAL THERAPY:  STM & TPR delts, pecs, UTs  Scapular mobilizations all directions  R GH mobs anterior and inferior grade II to III  NMED  Prone "I", "W", "T" x10 each R&L working on reducing deltoid and UT overactivity     PATIENT EDUCATION: Education details: TENS, how to use, electrode placement, precautions Person educated: Patient Education method: Explanation, Demonstration, Tactile cues, Verbal cues, and Handouts Education comprehension: verbalized understanding, returned demonstration, verbal cues required, tactile cues required, and needs further education   HOME EXERCISE PROGRAM: Access Code: [email or web address] URL: [email or web address] Date: 04/30/2022 Prepared by: Gillermo Murdoch  Exercises - Shoulder External Rotation and Scapular Retraction  - 3 x daily - 7 x weekly - 1 sets - 10 reps - 3 sec   hold - Doorway Pec Stretch at 60 Degrees Abduction  - 3 x daily - 7 x weekly - 1 sets - 3 reps - Doorway Pec Stretch at 90 Degrees Abduction  - 3 x daily - 7 x weekly - 1 sets - 3 reps - 30 seconds  hold - Doorway Pec Stretch at 120 Degrees Abduction  - 3 x daily - 7 x weekly - 1 sets - 3 reps - 30 second hold  hold - Prone Scapular Slide with Shoulder Extension  - 1 x daily - 7 x weekly - 1 sets - 10 reps - Prone W Scapular Retraction  - 1 x daily - 7 x weekly - 1 sets - 10 reps - Prone Scapular Retraction Y  - 1 x daily - 7 x  weekly - 1 sets - 10 reps - Wall Clock with Theraband  - 1 x daily - 7 x weekly - 1 sets - 10 reps - Serratus Activation at Wall  - 1 x daily - 7 x weekly - 2 sets - 10 reps - Supine Transversus Abdominis Bracing with Pelvic Floor Contraction  - 2 x daily - 7 x weekly - 1 sets - 10 reps - 10sec  hold  ASSESSMENT:  CLINICAL IMPRESSION: Continued with current exercise program adding core activation exercise for home. Dry needling and manual work through [place] shoulder girdle working on pecs; anterior deltoid; biceps; upper trap; teres with use of  e-stim. Good response to DN and manual wor kwith decreased palpable tightness noted.    GOALS: Goals reviewed with patient?  yes   LONG TERM GOALS: Target date: 06/05/2022    Improve posture and alignment with patient to demonstrate improved upright posture with posterior shoulder girdle engaged Baseline:  Goal status: INITIAL  2.  Increase AROM Rt shoulder to =/> than AROM Lt shoulder  Baseline:  Goal status: INITIAL  3.  Decrease bilat shoulder pain by 50-75% Baseline:  Goal status: INITIAL  4.  Patient to report ability to transfer in and out of chairs and turn/roll in bed Baseline:  Goal status: INITIAL  5.  Independent in HEP including aquatic program as indicated  Baseline:  Goal status: INITIAL  6.  Improve functional limitation score to 65 Baseline: 49 Goal status: INITIAL   PLAN: PT FREQUENCY: 2x/week  PT DURATION: 8 weeks  PLANNED INTERVENTIONS: Therapeutic exercises, Therapeutic activity, Neuromuscular re-education, Patient/Family education, Self Care,  Joint mobilization, Aquatic Therapy, Dry Needling, Electrical stimulation, Cryotherapy, Moist heat, Taping, Vasopneumatic device, and Ultrasound  PLAN FOR NEXT SESSION: R joint mobilizations -- trial of sleeper stretch. How was TPDN? review and progress exercises; continue postural correction and education; modalities as indicated    Kelley Knoth Nilda Simmer, PT, MPH   04/30/2022, 4:25 PM

## 2022-05-01 ENCOUNTER — Other Ambulatory Visit (HOSPITAL_COMMUNITY): Payer: Self-pay

## 2022-05-03 ENCOUNTER — Encounter: Payer: Self-pay | Admitting: Rehabilitative and Restorative Service Providers"

## 2022-05-03 ENCOUNTER — Ambulatory Visit: Payer: BC Managed Care – PPO | Admitting: Rehabilitative and Restorative Service Providers"

## 2022-05-03 DIAGNOSIS — R29898 Other symptoms and signs involving the musculoskeletal system: Secondary | ICD-10-CM

## 2022-05-03 DIAGNOSIS — R293 Abnormal posture: Secondary | ICD-10-CM

## 2022-05-03 DIAGNOSIS — M25511 Pain in right shoulder: Secondary | ICD-10-CM | POA: Diagnosis not present

## 2022-05-03 DIAGNOSIS — M539 Dorsopathy, unspecified: Secondary | ICD-10-CM

## 2022-05-03 DIAGNOSIS — G8929 Other chronic pain: Secondary | ICD-10-CM | POA: Diagnosis not present

## 2022-05-03 NOTE — Therapy (Signed)
OUTPATIENT PHYSICAL THERAPY TREATMENT   Patient Name: Paul [name]. MRN: [id number] DOB:May 12, 1971, 51 y.o., male Today's Date: 05/03/2022   PT End of Session - 05/03/22 0805     Visit Number 7    Number of Visits 16    Date for PT Re-Evaluation 06/05/22    PT Start Time 0805    PT Stop Time 0845    PT Time Calculation (min) 40 min    Activity Tolerance Patient tolerated treatment well              Past Medical History:  Diagnosis Date   Abdominal pain    Cholelithiasis    History of kidney stones    Insomnia    Syncope    Past Surgical History:  Procedure Laterality Date   CHOLECYSTECTOMY N/A 11/20/2012   Procedure: LAPAROSCOPIC CHOLECYSTECTOMY converted to open cholecystectomy with  CHOLANGIOGRAM;  Surgeon: Odis Hollingshead, MD;  Location: [facility];  Service: General;  Laterality: N/A;   ERCP N/A 11/22/2012   Procedure: ENDOSCOPIC RETROGRADE CHOLANGIOPANCREATOGRAPHY (ERCP);  Surgeon: Jeryl Columbia, MD;  Location: [facility];  Service: Endoscopy;  Laterality: N/A;   ERCP N/A 04/21/2013   Procedure: ENDOSCOPIC RETROGRADE CHOLANGIOPANCREATOGRAPHY (ERCP);  Surgeon: Jeryl Columbia, MD;  Location: [facility];  Service: Endoscopy;  Laterality: N/A;   ESOPHAGOGASTRODUODENOSCOPY N/A 01/01/2013   Procedure: ESOPHAGOGASTRODUODENOSCOPY (EGD);  Surgeon: Jeryl Columbia, MD;  Location: [facility];  Service: Endoscopy;  Laterality: N/A;   EXTRACORPOREAL SHOCK WAVE LITHOTRIPSY Right 12/31/2021   Procedure: EXTRACORPOREAL SHOCK WAVE LITHOTRIPSY (ESWL);  Surgeon: Festus Aloe, MD;  Location: [facility];  Service: Urology;  Laterality: Right;   HERNIA REPAIR     Childhood./ inguinal   WISDOM TOOTH EXTRACTION     Patient Active Problem List   Diagnosis Date Noted   Cerebral venous thrombosis of cortical vein 03/26/2022   Confusion 11/28/2020   Partial symptomatic epilepsy with complex partial seizures, not intractable, without status epilepticus ([place]) 11/28/2020    Choledocholithiasis 04/19/2013   CAP (community acquired pneumonia) 04/19/2013   Dehydration 04/19/2013   Cholecystitis, acute s/p open cholecystectomy 11/20/12 11/02/2012    PCP: Dr Deland Pretty  REFERRING PROVIDER: Dr Lynne Leader  REFERRING DIAG: Chronic Rt shoulder pain   THERAPY DIAG:  Chronic right shoulder pain  Other symptoms and signs involving the musculoskeletal system  Abnormal posture  Cervical dysfunction  Rationale for Evaluation and Treatment Rehabilitation  ONSET DATE: 02/02/22  SUBJECTIVE:  SUBJECTIVE STATEMENT: The patient does feel like he is getting increased ROM.  He still has soreness with reaching overhead and sideways.    PERTINENT HISTORY: Seizure 7/23 with compression fx T12 in TLSO brace for ~ 30 more days - has been in brace for ~ 60 days.  PRECAUTIONS:  LSO donned during therapy, reports 20 lb load restriction 05/03/22  PAIN:  Are you having pain? Yes: NPRS scale: "a little bit of an ache"/10 Pain location: shoulders Rt > Lt  Pain description: achiness Aggravating factors: lifting arm up and out to the side; reaching; pushing up to get out of a chair; opening bottles with push caps; pushing down cutting food; push ups; turning the lamp by the sofa on and off   Relieving factors: rest; no movement of arms    PATIENT GOALS get rid of shoulder pain   OBJECTIVE:   Active ROM Right eval Left eval  Shoulder flexion 152 155  Shoulder extension 74 56  Shoulder abduction 160 158  Shoulder adduction    Shoulder internal rotation Thumb T5 Thumb T4  Shoulder external rotation 88 90  Elbow flexion    Elbow extension    Wrist flexion    Wrist extension    Wrist ulnar deviation    Wrist radial deviation    Wrist pronation    Wrist supination    (Blank rows = not  tested)  TODAY'S TREATMENT:    05/03/22: THEREX UBE 2 minutes forward and 2 minutes backwards Wall sleeper stretch for R UE, then L UE Lat stretch at wall sliding pinky finger up wall and then bring axilla to wall Red band x 15 reps shoulder extension standing Blue Band x 10 reps shoulder extension standing with cues for technique Red band shoulder flexion x 15 reps x 2 with shoulder flexion to 90 Red band scaption x 15 reps x 2 sets to 90 Bent arm shoulder abduction x 10 reps x 2 sets with 3 lbs bilat with some mild pain during movement Bilat upper trap stretch in standing  MANUAL Sidelying R pec stretch and overhead stretch with parascapular mobilization Supine AC A>P joint mobs grade II-III to tolerance R and L sides P/ROM into neural gliding R and L UEs IASTM and STM R and L anterior shoulder into biceps tendon  04/30/22:  Pulley flexion  ~ 2 min  Pulley scaption ~ 2 min UBE L4  x 4 min alternating fwd/back  Activation of core for HEP   Trigger Point Dry-Needling  Treatment instructions: Expect mild to moderate muscle soreness. S/S of pneumothorax if dry needled over a lung field, and to seek immediate medical attention should they occur. Patient verbalized understanding of these instructions and education.  Patient Consent Given: Yes Education handout provided: Previously provided Muscles treated: Lt upper trap; deltoids; biceps; pecs; teres Electrical stimulation performed: Yes Parameters: mAmp current x 5 min x 2 locations Treatment response/outcome: decreased palpable tightness    04/26/22 UBE L3 x 4 min alt fwd/bkwd  Standing against pool noodle: Bilat ER red TB x 20 W red TB x 20 L  2 x 10  Scap clock x 10 bilat red TB Serratus wall slide red TB x 10  Prone Y, T, I 2 x 10  Manual: STM upper trap, deltoids bilat  Trigger Point Dry-Needling  Treatment instructions: Expect mild to moderate muscle soreness. S/S of pneumothorax if dry needled over a lung  field, and to seek immediate medical attention should they occur. Patient verbalized  understanding of these instructions and education.  Patient Consent Given: Yes Education handout provided: Previously provided Muscles treated: upper trap,deltoids bilat Electrical stimulation performed: No Parameters: N/A Treatment response/outcome: twitch response   PATIENT EDUCATION: Education details: discussed HEP continuation, add walkig Person educated: Patient Education method: Explanation Education comprehension: verbalized understanding   HOME EXERCISE PROGRAM: Access Code: [email or web address] URL: [email or web address] Date: 04/30/2022 Prepared by: Gillermo Murdoch  Exercises - Shoulder External Rotation and Scapular Retraction  - 3 x daily - 7 x weekly - 1 sets - 10 reps - 3 sec   hold - Doorway Pec Stretch at 60 Degrees Abduction  - 3 x daily - 7 x weekly - 1 sets - 3 reps - Doorway Pec Stretch at 90 Degrees Abduction  - 3 x daily - 7 x weekly - 1 sets - 3 reps - 30 seconds  hold - Doorway Pec Stretch at 120 Degrees Abduction  - 3 x daily - 7 x weekly - 1 sets - 3 reps - 30 second hold  hold - Prone Scapular Slide with Shoulder Extension  - 1 x daily - 7 x weekly - 1 sets - 10 reps - Prone W Scapular Retraction  - 1 x daily - 7 x weekly - 1 sets - 10 reps - Prone Scapular Retraction Y  - 1 x daily - 7 x weekly - 1 sets - 10 reps - Wall Clock with Theraband  - 1 x daily - 7 x weekly - 1 sets - 10 reps - Serratus Activation at Wall  - 1 x daily - 7 x weekly - 2 sets - 10 reps - Supine Transversus Abdominis Bracing with Pelvic Floor Contraction  - 2 x daily - 7 x weekly - 1 sets - 10 reps - 10sec  hold  ASSESSMENT:  CLINICAL IMPRESSION: The patient is continuing to gain motion, however, feels pain with use of UEs.  PT continuing to progress strengthening to tolerance today.  We also discussed adding home walking to routine to gain increased endurance and core strength prior to LSO being  d/c'd at end of the month.    GOALS: Goals reviewed with patient?  yes   LONG TERM GOALS: Target date: 06/05/2022    Improve posture and alignment with patient to demonstrate improved upright posture with posterior shoulder girdle engaged Baseline:  Goal status: IN PROGRESS  2.  Increase AROM Rt shoulder to =/> than AROM Lt shoulder  Baseline:  Goal status: IN PROGRESS  3.  Decrease bilat shoulder pain by 50-75% Baseline:  Goal status: IN PROGRESS  4.  Patient to report ability to transfer in and out of chairs and turn/roll in bed Baseline: IN PROGRESS  5.  Independent in HEP including aquatic program as indicated  Baseline:  Goal status: INITIAL  6.  Improve functional limitation score to 65 Baseline: 49 Goal status: INITIAL   PLAN: PT FREQUENCY: 2x/week  PT DURATION: 8 weeks  PLANNED INTERVENTIONS: Therapeutic exercises, Therapeutic activity, Neuromuscular re-education, Patient/Family education, Self Care, Joint mobilization, Aquatic Therapy, Dry Needling, Electrical stimulation, Cryotherapy, Moist heat, Taping, Vasopneumatic device, and Ultrasound  PLAN FOR NEXT SESSION: R joint mobilizations -- trial of sleeper stretch. How was TPDN? review and progress exercises; continue postural correction and education; modalities as indicated    Juliany Daughety, PT, MPH  05/03/2022, 8:06 AM

## 2022-05-07 ENCOUNTER — Ambulatory Visit: Payer: BC Managed Care – PPO | Admitting: Physical Therapy

## 2022-05-07 ENCOUNTER — Encounter: Payer: Self-pay | Admitting: Physical Therapy

## 2022-05-07 DIAGNOSIS — G8929 Other chronic pain: Secondary | ICD-10-CM | POA: Diagnosis not present

## 2022-05-07 DIAGNOSIS — M539 Dorsopathy, unspecified: Secondary | ICD-10-CM

## 2022-05-07 DIAGNOSIS — R29898 Other symptoms and signs involving the musculoskeletal system: Secondary | ICD-10-CM

## 2022-05-07 DIAGNOSIS — M25511 Pain in right shoulder: Secondary | ICD-10-CM | POA: Diagnosis not present

## 2022-05-07 DIAGNOSIS — R293 Abnormal posture: Secondary | ICD-10-CM | POA: Diagnosis not present

## 2022-05-07 NOTE — Therapy (Signed)
OUTPATIENT PHYSICAL THERAPY TREATMENT   Patient Name: Paul [name]. MRN: [id number] DOB:January 29, 1971, 51 y.o., male Today's Date: 05/07/2022   PT End of Session - 05/07/22 1532     Visit Number 8    Number of Visits 16    Date for PT Re-Evaluation 06/05/22    PT Start Time 1532    PT Stop Time 1615    PT Time Calculation (min) 43 min    Activity Tolerance Patient tolerated treatment well    Behavior During Therapy WFL for tasks assessed/performed              Past Medical History:  Diagnosis Date   Abdominal pain    Cholelithiasis    History of kidney stones    Insomnia    Syncope    Past Surgical History:  Procedure Laterality Date   CHOLECYSTECTOMY N/A 11/20/2012   Procedure: LAPAROSCOPIC CHOLECYSTECTOMY converted to open cholecystectomy with  CHOLANGIOGRAM;  Surgeon: Odis Hollingshead, MD;  Location: [facility];  Service: General;  Laterality: N/A;   ERCP N/A 11/22/2012   Procedure: ENDOSCOPIC RETROGRADE CHOLANGIOPANCREATOGRAPHY (ERCP);  Surgeon: Jeryl Columbia, MD;  Location: [facility];  Service: Endoscopy;  Laterality: N/A;   ERCP N/A 04/21/2013   Procedure: ENDOSCOPIC RETROGRADE CHOLANGIOPANCREATOGRAPHY (ERCP);  Surgeon: Jeryl Columbia, MD;  Location: [facility];  Service: Endoscopy;  Laterality: N/A;   ESOPHAGOGASTRODUODENOSCOPY N/A 01/01/2013   Procedure: ESOPHAGOGASTRODUODENOSCOPY (EGD);  Surgeon: Jeryl Columbia, MD;  Location: [facility];  Service: Endoscopy;  Laterality: N/A;   EXTRACORPOREAL SHOCK WAVE LITHOTRIPSY Right 12/31/2021   Procedure: EXTRACORPOREAL SHOCK WAVE LITHOTRIPSY (ESWL);  Surgeon: Festus Aloe, MD;  Location: [facility];  Service: Urology;  Laterality: Right;   HERNIA REPAIR     Childhood./ inguinal   WISDOM TOOTH EXTRACTION     Patient Active Problem List   Diagnosis Date Noted   Cerebral venous thrombosis of cortical vein 03/26/2022   Confusion 11/28/2020   Partial symptomatic epilepsy with complex partial seizures, not  intractable, without status epilepticus ([place]) 11/28/2020   Choledocholithiasis 04/19/2013   CAP (community acquired pneumonia) 04/19/2013   Dehydration 04/19/2013   Cholecystitis, acute s/p open cholecystectomy 11/20/12 11/02/2012    PCP: Dr Deland Pretty  REFERRING PROVIDER: Dr Lynne Leader  REFERRING DIAG: Chronic Rt shoulder pain   THERAPY DIAG:  Chronic right shoulder pain  Other symptoms and signs involving the musculoskeletal system  Abnormal posture  Cervical dysfunction  Rationale for Evaluation and Treatment Rehabilitation  ONSET DATE: 02/02/22  SUBJECTIVE:  SUBJECTIVE STATEMENT: Pt will be seeing doctor 10/27 -- hopes for brace to come off. Pt reports nothing new or different. Pt reports some trigger points in his shoulder that feels have been growing.   PERTINENT HISTORY: Seizure 7/23 with compression fx T12 in TLSO brace for ~ 30 more days - has been in brace for ~ 60 days.  PRECAUTIONS:  LSO donned during therapy, reports 20 lb load restriction 05/03/22  PAIN:  Are you having pain? Yes: NPRS scale: "a little bit of an ache"/10 Pain location: shoulders Rt > Lt  Pain description: achiness Aggravating factors: lifting arm up and out to the side; reaching; pushing up to get out of a chair; opening bottles with push caps; pushing down cutting food; push ups; turning the lamp by the sofa on and off   Relieving factors: rest; no movement of arms    PATIENT GOALS get rid of shoulder pain   OBJECTIVE:   Active ROM Right eval Left eval  Shoulder flexion 152 155  Shoulder extension 74 56  Shoulder abduction 160 158  Shoulder adduction    Shoulder internal rotation Thumb T5 Thumb T4  Shoulder external rotation 88 90  Elbow flexion    Elbow extension    Wrist flexion    Wrist extension     Wrist ulnar deviation    Wrist radial deviation    Wrist pronation    Wrist supination    (Blank rows = not tested)  TODAY'S TREATMENT:    05/07/22:   THEREX   UBE L4 x 2 min fwd/2 min bwd   Doorway pec stretch 3 positions x 30 sec   Bicep stretch with hands behind back x30 sec    Sleeper stretch 3x30 sec   Crossbody stretch 2x30 sec   Horizontal shoulder abduction x5 (pt can feel pinch in posterior shoulder)    MANUAL   STM & TPR deltoids and UTs   Skilled assessment and palpation for TPDN   Trigger Point Dry-Needling  Treatment instructions: Expect mild to moderate muscle soreness. S/S of pneumothorax if dry needled over a lung field, and to seek immediate medical attention should they occur. Patient verbalized understanding of these instructions and education.  Patient Consent Given: Yes Education handout provided: Previously provided Muscles treated: ant, mid and posterior deltoid Electrical stimulation performed: No Parameters: n/a Treatment response/outcome: decreased palpable tightness        05/03/22: THEREX UBE 2 minutes forward and 2 minutes backwards Wall sleeper stretch for R UE, then L UE Lat stretch at wall sliding pinky finger up wall and then bring axilla to wall Red band x 15 reps shoulder extension standing Blue Band x 10 reps shoulder extension standing with cues for technique Red band shoulder flexion x 15 reps x 2 with shoulder flexion to 90 Red band scaption x 15 reps x 2 sets to 90 Bent arm shoulder abduction x 10 reps x 2 sets with 3 lbs bilat with some mild pain during movement Bilat upper trap stretch in standing  MANUAL Sidelying R pec stretch and overhead stretch with parascapular mobilization Supine AC A>P joint mobs grade II-III to tolerance R and L sides P/ROM into neural gliding R and L UEs IASTM and STM R and L anterior shoulder into biceps tendon  04/30/22:  Pulley flexion  ~ 2 min  Pulley scaption ~ 2 min UBE L4  x 4 min  alternating fwd/back  Activation of core for HEP   Trigger Point Dry-Needling  Treatment  instructions: Expect mild to moderate muscle soreness. S/S of pneumothorax if dry needled over a lung field, and to seek immediate medical attention should they occur. Patient verbalized understanding of these instructions and education.  Patient Consent Given: Yes Education handout provided: Previously provided Muscles treated: Lt upper trap; deltoids; biceps; pecs; teres Electrical stimulation performed: Yes Parameters: mAmp current x 5 min x 2 locations Treatment response/outcome: decreased palpable tightness     PATIENT EDUCATION: Education details: discussed HEP continuation, add walkig Person educated: Patient Education method: Explanation Education comprehension: verbalized understanding   HOME EXERCISE PROGRAM: Access Code: [email or web address] URL: [email or web address] Date: 04/30/2022 Prepared by: Gillermo Murdoch  Exercises - Shoulder External Rotation and Scapular Retraction  - 3 x daily - 7 x weekly - 1 sets - 10 reps - 3 sec   hold - Doorway Pec Stretch at 60 Degrees Abduction  - 3 x daily - 7 x weekly - 1 sets - 3 reps - Doorway Pec Stretch at 90 Degrees Abduction  - 3 x daily - 7 x weekly - 1 sets - 3 reps - 30 seconds  hold - Doorway Pec Stretch at 120 Degrees Abduction  - 3 x daily - 7 x weekly - 1 sets - 3 reps - 30 second hold  hold - Prone Scapular Slide with Shoulder Extension  - 1 x daily - 7 x weekly - 1 sets - 10 reps - Prone W Scapular Retraction  - 1 x daily - 7 x weekly - 1 sets - 10 reps - Prone Scapular Retraction Y  - 1 x daily - 7 x weekly - 1 sets - 10 reps - Wall Clock with Theraband  - 1 x daily - 7 x weekly - 1 sets - 10 reps - Serratus Activation at Wall  - 1 x daily - 7 x weekly - 2 sets - 10 reps - Supine Transversus Abdominis Bracing with Pelvic Floor Contraction  - 2 x daily - 7 x weekly - 1 sets - 10 reps - 10sec  hold  ASSESSMENT:  CLINICAL  IMPRESSION: Pt reports continued good improvements with ROM; however, pain remains during movement. Notes increased posterior shoulder pain/pinching with shoulder horizontal abduction -- performed trial of self joint mobilizations to see if this will decrease the posterior impingement. Continued TPDN. Consider cervical traction/manual work for possible C-spine involvement causing radiculopathy.   GOALS: Goals reviewed with patient?  yes   LONG TERM GOALS: Target date: 06/05/2022    Improve posture and alignment with patient to demonstrate improved upright posture with posterior shoulder girdle engaged Baseline:  Goal status: IN PROGRESS  2.  Increase AROM Rt shoulder to =/> than AROM Lt shoulder  Baseline:  Goal status: IN PROGRESS  3.  Decrease bilat shoulder pain by 50-75% Baseline:  Goal status: IN PROGRESS  4.  Patient to report ability to transfer in and out of chairs and turn/roll in bed Baseline: IN PROGRESS  5.  Independent in HEP including aquatic program as indicated  Baseline:  Goal status: IN PROGRESS 6.  Improve functional limitation score to 65 Baseline: 49 Goal status: IN PROGRESS   PLAN: PT FREQUENCY: 2x/week  PT DURATION: 8 weeks  PLANNED INTERVENTIONS: Therapeutic exercises, Therapeutic activity, Neuromuscular re-education, Patient/Family education, Self Care, Joint mobilization, Aquatic Therapy, Dry Needling, Electrical stimulation, Cryotherapy, Moist heat, Taping, Vasopneumatic device, and Ultrasound  PLAN FOR NEXT SESSION: R joint mobilizations -- trial of sleeper stretch. How was TPDN? review and progress exercises; continue postural correction and education;  modalities as indicated    [facility] April Ma L Ahuva Poynor, PT, DPT 05/07/2022, 3:33 PM

## 2022-05-08 ENCOUNTER — Ambulatory Visit
Admission: RE | Admit: 2022-05-08 | Discharge: 2022-05-08 | Disposition: A | Discharge status: Home / Self Care | Payer: BC Managed Care – PPO | Source: Ambulatory Visit | Attending: Student | Admitting: Student

## 2022-05-08 DIAGNOSIS — I700 Atherosclerosis of aorta: Secondary | ICD-10-CM | POA: Diagnosis not present

## 2022-05-08 DIAGNOSIS — S22080A Wedge compression fracture of T11-T12 vertebra, initial encounter for closed fracture: Secondary | ICD-10-CM

## 2022-05-08 DIAGNOSIS — N200 Calculus of kidney: Secondary | ICD-10-CM | POA: Diagnosis not present

## 2022-05-10 ENCOUNTER — Encounter: Payer: Self-pay | Admitting: Rehabilitative and Restorative Service Providers"

## 2022-05-10 ENCOUNTER — Ambulatory Visit: Payer: BC Managed Care – PPO | Admitting: Rehabilitative and Restorative Service Providers"

## 2022-05-10 DIAGNOSIS — G8929 Other chronic pain: Secondary | ICD-10-CM | POA: Diagnosis not present

## 2022-05-10 DIAGNOSIS — M539 Dorsopathy, unspecified: Secondary | ICD-10-CM

## 2022-05-10 DIAGNOSIS — R293 Abnormal posture: Secondary | ICD-10-CM | POA: Diagnosis not present

## 2022-05-10 DIAGNOSIS — M25511 Pain in right shoulder: Secondary | ICD-10-CM | POA: Diagnosis not present

## 2022-05-10 DIAGNOSIS — R29898 Other symptoms and signs involving the musculoskeletal system: Secondary | ICD-10-CM | POA: Diagnosis not present

## 2022-05-10 NOTE — Therapy (Signed)
OUTPATIENT PHYSICAL THERAPY TREATMENT   Patient Name: Paul [name]. MRN: [id number] DOB:01/20/1971, 51 y.o., male Today's Date: 05/10/2022   PT End of Session - 05/10/22 0803     Visit Number 9    Number of Visits 16    Date for PT Re-Evaluation 06/05/22    PT Start Time 0805    PT Stop Time 0845    PT Time Calculation (min) 40 min    Activity Tolerance Patient tolerated treatment well    Behavior During Therapy [facility] for tasks assessed/performed              Past Medical History:  Diagnosis Date   Abdominal pain    Cholelithiasis    History of kidney stones    Insomnia    Syncope    Past Surgical History:  Procedure Laterality Date   CHOLECYSTECTOMY N/A 11/20/2012   Procedure: LAPAROSCOPIC CHOLECYSTECTOMY converted to open cholecystectomy with  CHOLANGIOGRAM;  Surgeon: Odis Hollingshead, MD;  Location: [facility];  Service: General;  Laterality: N/A;   ERCP N/A 11/22/2012   Procedure: ENDOSCOPIC RETROGRADE CHOLANGIOPANCREATOGRAPHY (ERCP);  Surgeon: Jeryl Columbia, MD;  Location: [facility];  Service: Endoscopy;  Laterality: N/A;   ERCP N/A 04/21/2013   Procedure: ENDOSCOPIC RETROGRADE CHOLANGIOPANCREATOGRAPHY (ERCP);  Surgeon: Jeryl Columbia, MD;  Location: [facility];  Service: Endoscopy;  Laterality: N/A;   ESOPHAGOGASTRODUODENOSCOPY N/A 01/01/2013   Procedure: ESOPHAGOGASTRODUODENOSCOPY (EGD);  Surgeon: Jeryl Columbia, MD;  Location: [facility];  Service: Endoscopy;  Laterality: N/A;   EXTRACORPOREAL SHOCK WAVE LITHOTRIPSY Right 12/31/2021   Procedure: EXTRACORPOREAL SHOCK WAVE LITHOTRIPSY (ESWL);  Surgeon: Festus Aloe, MD;  Location: [facility];  Service: Urology;  Laterality: Right;   HERNIA REPAIR     Childhood./ inguinal   WISDOM TOOTH EXTRACTION     Patient Active Problem List   Diagnosis Date Noted   Cerebral venous thrombosis of cortical vein 03/26/2022   Confusion 11/28/2020   Partial symptomatic epilepsy with complex partial seizures, not  intractable, without status epilepticus ([place]) 11/28/2020   Choledocholithiasis 04/19/2013   CAP (community acquired pneumonia) 04/19/2013   Dehydration 04/19/2013   Cholecystitis, acute s/p open cholecystectomy 11/20/12 11/02/2012    PCP: Dr Deland Pretty  REFERRING PROVIDER: Dr Lynne Leader  REFERRING DIAG: Chronic Rt shoulder pain   THERAPY DIAG:  Chronic right shoulder pain  Other symptoms and signs involving the musculoskeletal system  Abnormal posture  Cervical dysfunction  Rationale for Evaluation and Treatment Rehabilitation  ONSET DATE: 02/02/22  SUBJECTIVE:  SUBJECTIVE STATEMENT: The patient sees MD next week.  He gets discomfort in middle deltoid at end range of ER.   PERTINENT HISTORY: Seizure 7/23 with compression fx T12 in TLSO brace for ~ 30 more days - has been in brace for ~ 60 days.  PRECAUTIONS:  LSO donned during therapy, reports 20 lb load restriction 05/03/22  PAIN:  Are you having pain? Yes: NPRS scale: 0/10 Pain location: None today Pain description: None today Aggravating factors: n/a Relieving factors: n/a   PATIENT GOALS get rid of shoulder pain   OBJECTIVE:   Active ROM Right eval Left eval  Shoulder flexion 152 155  Shoulder extension 74 56  Shoulder abduction 160 158  Shoulder adduction    Shoulder internal rotation Thumb T5 Thumb T4  Shoulder external rotation 88 90  Elbow flexion    Elbow extension    Wrist flexion    Wrist extension    Wrist ulnar deviation    Wrist radial deviation    Wrist pronation    Wrist supination    (Blank rows = not tested)  TODAY'S TREATMENT:  05/10/22: THEREX UBE LEVEL 4 x 4 minutes (2.5 forward/1.5 back)  Prone  L and R shoulder ER with 2 lbs x 15 repetitions  Supine Serratus punches with 3 lbs R and L  supine Reactive isometrics x 1 minute R  Standing Feels a "catch" at mid range with shoulder flexion  Pec release using a ball against door frame x 3-4 minutes for STM  MANUAL P/ROM stretching Trigger Point Dry-Needling  Treatment instructions: Expect mild to moderate muscle soreness. S/S of pneumothorax if dry needled over a lung field, and to seek immediate medical attention should they occur. Patient verbalized understanding of these instructions and education.  Patient Consent Given: Yes Education handout provided: Yes Muscles treated: R biceps, R anterior deltoid Treatment response/outcome: palpable lengthening   05/07/22:   THEREX   UBE L4 x 2 min fwd/2 min bwd   Doorway pec stretch 3 positions x 30 sec   Bicep stretch with hands behind back x30 sec    Sleeper stretch 3x30 sec   Crossbody stretch 2x30 sec   Horizontal shoulder abduction x5 (pt can feel pinch in posterior shoulder)    MANUAL   STM & TPR deltoids and UTs   Skilled assessment and palpation for TPDN   Trigger Point Dry-Needling  Treatment instructions: Expect mild to moderate muscle soreness. S/S of pneumothorax if dry needled over a lung field, and to seek immediate medical attention should they occur. Patient verbalized understanding of these instructions and education.  Patient Consent Given: Yes Education handout provided: Previously provided Muscles treated: ant, mid and posterior deltoid Electrical stimulation performed: No Parameters: n/a Treatment response/outcome: decreased palpable tightness        05/03/22: THEREX UBE 2 minutes forward and 2 minutes backwards Wall sleeper stretch for R UE, then L UE Lat stretch at wall sliding pinky finger up wall and then bring axilla to wall Red band x 15 reps shoulder extension standing Blue Band x 10 reps shoulder extension standing with cues for technique Red band shoulder flexion x 15 reps x 2 with shoulder flexion to 90 Red band scaption x 15 reps x  2 sets to 90 Bent arm shoulder abduction x 10 reps x 2 sets with 3 lbs bilat with some mild pain during movement Bilat upper trap stretch in standing  MANUAL Sidelying R pec stretch and overhead stretch with parascapular mobilization Supine AC  A>P joint mobs grade II-III to tolerance R and L sides P/ROM into neural gliding R and L UEs IASTM and STM R and L anterior shoulder into biceps tendon  PATIENT EDUCATION: Education details: discussed HEP continuation, add walkig Person educated: Patient Education method: Explanation Education comprehension: verbalized understanding  HOME EXERCISE PROGRAM: Access Code: [email or web address] URL: [email or web address] Date: 04/30/2022 Prepared by: Gillermo Murdoch  Exercises - Shoulder External Rotation and Scapular Retraction  - 3 x daily - 7 x weekly - 1 sets - 10 reps - 3 sec   hold - Doorway Pec Stretch at 60 Degrees Abduction  - 3 x daily - 7 x weekly - 1 sets - 3 reps - Doorway Pec Stretch at 90 Degrees Abduction  - 3 x daily - 7 x weekly - 1 sets - 3 reps - 30 seconds  hold - Doorway Pec Stretch at 120 Degrees Abduction  - 3 x daily - 7 x weekly - 1 sets - 3 reps - 30 second hold  hold - Prone Scapular Slide with Shoulder Extension  - 1 x daily - 7 x weekly - 1 sets - 10 reps - Prone W Scapular Retraction  - 1 x daily - 7 x weekly - 1 sets - 10 reps - Prone Scapular Retraction Y  - 1 x daily - 7 x weekly - 1 sets - 10 reps - Wall Clock with Theraband  - 1 x daily - 7 x weekly - 1 sets - 10 reps - Serratus Activation at Wall  - 1 x daily - 7 x weekly - 2 sets - 10 reps - Supine Transversus Abdominis Bracing with Pelvic Floor Contraction  - 2 x daily - 7 x weekly - 1 sets - 10 reps - 10sec  hold  ASSESSMENT:  CLINICAL IMPRESSION: The patient has reduced pain with AP positioning over coracoid process to help lengthen pec minor-- with manual pressure + ROM into ER, he does not get the anterior/middle deltoid pain with positioning.  The patient  continues with end range ER discomfort and discomfort during mid range of R anterior flexion.  PT continues with strengthening, manual and DN as needed.   GOALS: Goals reviewed with patient?  yes   LONG TERM GOALS: Target date: 06/05/2022    Improve posture and alignment with patient to demonstrate improved upright posture with posterior shoulder girdle engaged Baseline:  Goal status: IN PROGRESS  2.  Increase AROM Rt shoulder to =/> than AROM Lt shoulder  Baseline:  Goal status: IN PROGRESS  3.  Decrease bilat shoulder pain by 50-75% Baseline:  Goal status: IN PROGRESS  4.  Patient to report ability to transfer in and out of chairs and turn/roll in bed Baseline: IN PROGRESS  5.  Independent in HEP including aquatic program as indicated  Baseline:  Goal status: IN PROGRESS  6.  Improve functional limitation score to 65 Baseline: 49 Goal status: IN PROGRESS   PLAN: PT FREQUENCY: 2x/week  PT DURATION: 8 weeks  PLANNED INTERVENTIONS: Therapeutic exercises, Therapeutic activity, Neuromuscular re-education, Patient/Family education, Self Care, Joint mobilization, Aquatic Therapy, Dry Needling, Electrical stimulation, Cryotherapy, Moist heat, Taping, Vasopneumatic device, and Ultrasound  PLAN FOR NEXT SESSION: R joint mobilizations -- trial of sleeper stretch. How was TPDN? review and progress exercises; continue postural correction and education; modalities as indicated    Elizabeth Lake, [place] 05/10/2022, 8:03 AM

## 2022-05-14 ENCOUNTER — Encounter: Payer: Self-pay | Admitting: Physical Therapy

## 2022-05-14 ENCOUNTER — Ambulatory Visit: Payer: BC Managed Care – PPO | Admitting: Physical Therapy

## 2022-05-14 DIAGNOSIS — M25511 Pain in right shoulder: Secondary | ICD-10-CM | POA: Diagnosis not present

## 2022-05-14 DIAGNOSIS — G8929 Other chronic pain: Secondary | ICD-10-CM | POA: Diagnosis not present

## 2022-05-14 DIAGNOSIS — R29898 Other symptoms and signs involving the musculoskeletal system: Secondary | ICD-10-CM

## 2022-05-14 DIAGNOSIS — R293 Abnormal posture: Secondary | ICD-10-CM | POA: Diagnosis not present

## 2022-05-14 DIAGNOSIS — M539 Dorsopathy, unspecified: Secondary | ICD-10-CM | POA: Diagnosis not present

## 2022-05-14 NOTE — Therapy (Signed)
OUTPATIENT PHYSICAL THERAPY TREATMENT   Patient Name: Paul [name]. MRN: [id number] DOB:14-Apr-1971, 51 y.o., male Today's Date: 05/14/2022   PT End of Session - 05/14/22 1656     Visit Number 10    Number of Visits 16    Date for PT Re-Evaluation 06/05/22    PT Start Time 1615    PT Stop Time 1656    PT Time Calculation (min) 41 min    Activity Tolerance Patient tolerated treatment well    Behavior During Therapy WFL for tasks assessed/performed               Past Medical History:  Diagnosis Date   Abdominal pain    Cholelithiasis    History of kidney stones    Insomnia    Syncope    Past Surgical History:  Procedure Laterality Date   CHOLECYSTECTOMY N/A 11/20/2012   Procedure: LAPAROSCOPIC CHOLECYSTECTOMY converted to open cholecystectomy with  CHOLANGIOGRAM;  Surgeon: Adolph Pollack, MD;  Location: [facility];  Service: General;  Laterality: N/A;   ERCP N/A 11/22/2012   Procedure: ENDOSCOPIC RETROGRADE CHOLANGIOPANCREATOGRAPHY (ERCP);  Surgeon: Petra Kuba, MD;  Location: [facility];  Service: Endoscopy;  Laterality: N/A;   ERCP N/A 04/21/2013   Procedure: ENDOSCOPIC RETROGRADE CHOLANGIOPANCREATOGRAPHY (ERCP);  Surgeon: Petra Kuba, MD;  Location: [facility];  Service: Endoscopy;  Laterality: N/A;   ESOPHAGOGASTRODUODENOSCOPY N/A 01/01/2013   Procedure: ESOPHAGOGASTRODUODENOSCOPY (EGD);  Surgeon: Petra Kuba, MD;  Location: [facility] ENDOSCOPY;  Service: Endoscopy;  Laterality: N/A;   EXTRACORPOREAL SHOCK WAVE LITHOTRIPSY Right 12/31/2021   Procedure: EXTRACORPOREAL SHOCK WAVE LITHOTRIPSY (ESWL);  Surgeon: Jerilee Field, MD;  Location: [facility];  Service: Urology;  Laterality: Right;   HERNIA REPAIR     Childhood./ inguinal   WISDOM TOOTH EXTRACTION     Patient Active Problem List   Diagnosis Date Noted   Cerebral venous thrombosis of cortical vein 03/26/2022   Confusion 11/28/2020   Partial symptomatic epilepsy with complex partial seizures, not  intractable, without status epilepticus ([facility]) 11/28/2020   Choledocholithiasis 04/19/2013   CAP (community acquired pneumonia) 04/19/2013   Dehydration 04/19/2013   Cholecystitis, acute s/p open cholecystectomy 11/20/12 11/02/2012    PCP: Dr Merri Brunette  REFERRING PROVIDER: Dr Clementeen Graham  REFERRING DIAG: Chronic Rt shoulder pain   THERAPY DIAG:  Chronic right shoulder pain  Other symptoms and signs involving the musculoskeletal system  Rationale for Evaluation and Treatment Rehabilitation  ONSET DATE: 02/02/22  SUBJECTIVE:  SUBJECTIVE STATEMENT: Patients states he has "a little pain reduction". He states most pain is middle deltoid area  PERTINENT HISTORY: Seizure 7/23 with compression fx T12 in TLSO brace for ~ 30 more days - has been in brace for ~ 60 days.  PRECAUTIONS:  LSO donned during therapy, reports 20 lb load restriction 05/03/22  PAIN:  Are you having pain? Yes: NPRS scale: 0/10 Pain location: None today Pain description: None today Aggravating factors: n/a Relieving factors: n/a   PATIENT GOALS get rid of shoulder pain   OBJECTIVE:   Active ROM Right eval Left eval  Shoulder flexion 152 155  Shoulder extension 74 56  Shoulder abduction 160 158  Shoulder adduction    Shoulder internal rotation Thumb T5 Thumb T4  Shoulder external rotation 88 90  Elbow flexion    Elbow extension    Wrist flexion    Wrist extension    Wrist ulnar deviation    Wrist radial deviation    Wrist pronation    Wrist supination    (Blank rows = not tested)  TODAY'S TREATMENT:  05/14/22 UBE L4 x 4 min alt fwd/bkwd  Lower trap setting at wall x 15 Serratus clock 2 x 10 red TB Serratus wall push up x 20  Prone ER 2# bilat 3 x 10  Manual: PROM and stretching Rt shoulder  Trigger Point  Dry-Needling  Treatment instructions: Expect mild to moderate muscle soreness. S/S of pneumothorax if dry needled over a lung field, and to seek immediate medical attention should they occur. Patient verbalized understanding of these instructions and education.  Patient Consent Given: Yes Education handout provided: Previously provided Muscles treated: anterior and middle deltoid, pecs Electrical stimulation performed: No Parameters: N/A Treatment response/outcome: twitch response, increased mm length    05/10/22: THEREX UBE LEVEL 4 x 4 minutes (2.5 forward/1.5 back)  Prone  L and R shoulder ER with 2 lbs x 15 repetitions  Supine Serratus punches with 3 lbs R and L supine Reactive isometrics x 1 minute R  Standing Feels a "catch" at mid range with shoulder flexion  Pec release using a ball against door frame x 3-4 minutes for STM  MANUAL P/ROM stretching Trigger Point Dry-Needling  Treatment instructions: Expect mild to moderate muscle soreness. S/S of pneumothorax if dry needled over a lung field, and to seek immediate medical attention should they occur. Patient verbalized understanding of these instructions and education.  Patient Consent Given: Yes Education handout provided: Yes Muscles treated: R biceps, R anterior deltoid Treatment response/outcome: palpable lengthening   05/07/22:   THEREX   UBE L4 x 2 min fwd/2 min bwd   Doorway pec stretch 3 positions x 30 sec   Bicep stretch with hands behind back x30 sec    Sleeper stretch 3x30 sec   Crossbody stretch 2x30 sec   Horizontal shoulder abduction x5 (pt can feel pinch in posterior shoulder)    MANUAL   STM & TPR deltoids and UTs   Skilled assessment and palpation for TPDN   Trigger Point Dry-Needling  Treatment instructions: Expect mild to moderate muscle soreness. S/S of pneumothorax if dry needled over a lung field, and to seek immediate medical attention should they occur. Patient verbalized understanding of  these instructions and education.  Patient Consent Given: Yes Education handout provided: Previously provided Muscles treated: ant, mid and posterior deltoid Electrical stimulation performed: No Parameters: n/a Treatment response/outcome: decreased palpable tightness     PATIENT EDUCATION: Education details: discussed HEP continuation, add  walkig Person educated: Patient Education method: Explanation Education comprehension: verbalized understanding  HOME EXERCISE PROGRAM: Access Code: [email or web address] URL: [email or web address] Date: 04/30/2022 Prepared by: Gillermo Murdoch  Exercises - Shoulder External Rotation and Scapular Retraction  - 3 x daily - 7 x weekly - 1 sets - 10 reps - 3 sec   hold - Doorway Pec Stretch at 60 Degrees Abduction  - 3 x daily - 7 x weekly - 1 sets - 3 reps - Doorway Pec Stretch at 90 Degrees Abduction  - 3 x daily - 7 x weekly - 1 sets - 3 reps - 30 seconds  hold - Doorway Pec Stretch at 120 Degrees Abduction  - 3 x daily - 7 x weekly - 1 sets - 3 reps - 30 second hold  hold - Prone Scapular Slide with Shoulder Extension  - 1 x daily - 7 x weekly - 1 sets - 10 reps - Prone W Scapular Retraction  - 1 x daily - 7 x weekly - 1 sets - 10 reps - Prone Scapular Retraction Y  - 1 x daily - 7 x weekly - 1 sets - 10 reps - Wall Clock with Theraband  - 1 x daily - 7 x weekly - 1 sets - 10 reps - Serratus Activation at Wall  - 1 x daily - 7 x weekly - 2 sets - 10 reps - Supine Transversus Abdominis Bracing with Pelvic Floor Contraction  - 2 x daily - 7 x weekly - 1 sets - 10 reps - 10sec  hold  ASSESSMENT:  CLINICAL IMPRESSION: Pt with good response to dry needling and manual work. Slight increase in ROM at end of session  GOALS: Goals reviewed with patient?  yes   LONG TERM GOALS: Target date: 06/05/2022    Improve posture and alignment with patient to demonstrate improved upright posture with posterior shoulder girdle engaged Baseline:  Goal status:  IN PROGRESS  2.  Increase AROM Rt shoulder to =/> than AROM Lt shoulder  Baseline:  Goal status: IN PROGRESS  3.  Decrease bilat shoulder pain by 50-75% Baseline:  Goal status: IN PROGRESS  4.  Patient to report ability to transfer in and out of chairs and turn/roll in bed Baseline: IN PROGRESS  5.  Independent in HEP including aquatic program as indicated  Baseline:  Goal status: IN PROGRESS  6.  Improve functional limitation score to 65 Baseline: 49 Goal status: IN PROGRESS   PLAN: PT FREQUENCY: 2x/week  PT DURATION: 8 weeks  PLANNED INTERVENTIONS: Therapeutic exercises, Therapeutic activity, Neuromuscular re-education, Patient/Family education, Self Care, Joint mobilization, Aquatic Therapy, Dry Needling, Electrical stimulation, Cryotherapy, Moist heat, Taping, Vasopneumatic device, and Ultrasound  PLAN FOR NEXT SESSION: R joint mobilizations -- trial of sleeper stretch. How was TPDN? review and progress exercises; continue postural correction and education; modalities as indicated    Knox Holdman, PT 05/14/2022, 4:57 PM

## 2022-05-15 ENCOUNTER — Ambulatory Visit: Payer: BC Managed Care – PPO | Admitting: Neurology

## 2022-05-17 ENCOUNTER — Ambulatory Visit: Payer: BC Managed Care – PPO | Admitting: Physical Therapy

## 2022-05-17 ENCOUNTER — Encounter: Payer: Self-pay | Admitting: Physical Therapy

## 2022-05-17 DIAGNOSIS — G8929 Other chronic pain: Secondary | ICD-10-CM

## 2022-05-17 DIAGNOSIS — R29898 Other symptoms and signs involving the musculoskeletal system: Secondary | ICD-10-CM

## 2022-05-17 DIAGNOSIS — R293 Abnormal posture: Secondary | ICD-10-CM | POA: Diagnosis not present

## 2022-05-17 DIAGNOSIS — M539 Dorsopathy, unspecified: Secondary | ICD-10-CM | POA: Diagnosis not present

## 2022-05-17 DIAGNOSIS — M25511 Pain in right shoulder: Secondary | ICD-10-CM | POA: Diagnosis not present

## 2022-05-17 NOTE — Therapy (Addendum)
OUTPATIENT PHYSICAL THERAPY TREATMENT AND DISCHARGE   Patient Name: Paul [name]. MRN: [id number] DOB:1970/09/24, 51 y.o., male Today's Date: 05/17/2022   PT End of Session - 05/17/22 0836     Visit Number 11    Number of Visits 16    Date for PT Re-Evaluation 06/05/22    PT Start Time 0800    PT Stop Time 0838    PT Time Calculation (min) 38 min    Activity Tolerance Patient tolerated treatment well    Behavior During Therapy [facility] for tasks assessed/performed                Past Medical History:  Diagnosis Date   Abdominal pain    Cholelithiasis    History of kidney stones    Insomnia    Syncope    Past Surgical History:  Procedure Laterality Date   CHOLECYSTECTOMY N/A 11/20/2012   Procedure: LAPAROSCOPIC CHOLECYSTECTOMY converted to open cholecystectomy with  CHOLANGIOGRAM;  Surgeon: Odis Hollingshead, MD;  Location: [facility];  Service: General;  Laterality: N/A;   ERCP N/A 11/22/2012   Procedure: ENDOSCOPIC RETROGRADE CHOLANGIOPANCREATOGRAPHY (ERCP);  Surgeon: Jeryl Columbia, MD;  Location: [facility];  Service: Endoscopy;  Laterality: N/A;   ERCP N/A 04/21/2013   Procedure: ENDOSCOPIC RETROGRADE CHOLANGIOPANCREATOGRAPHY (ERCP);  Surgeon: Jeryl Columbia, MD;  Location: [facility];  Service: Endoscopy;  Laterality: N/A;   ESOPHAGOGASTRODUODENOSCOPY N/A 01/01/2013   Procedure: ESOPHAGOGASTRODUODENOSCOPY (EGD);  Surgeon: Jeryl Columbia, MD;  Location: [facility] END[facility]COPY;  Service: Endoscopy;  Laterality: N/A;   EXTRACORPOREAL SHOCK WAVE LITHOTRIPSY Right 12/31/2021   Procedure: EXTRACORPOREAL SHOCK WAVE LITHOTRIPSY (ESWL);  Surgeon: Festus Aloe, MD;  Location: [facility];  Service: Urology;  Laterality: Right;   HERNIA REPAIR     Childhood./ inguinal   WISDOM TOOTH EXTRACTION     Patient Active Problem List   Diagnosis Date Noted   Cerebral venous thrombosis of cortical vein 03/26/2022   Confusion 11/28/2020   Partial symptomatic epilepsy with complex partial  seizures, not intractable, without status epilepticus ([place]) 11/28/2020   Choledocholithiasis 04/19/2013   CAP (community acquired pneumonia) 04/19/2013   Dehydration 04/19/2013   Cholecystitis, acute s/p open cholecystectomy 11/20/12 11/02/2012    PCP: Dr Deland Pretty  REFERRING PROVIDER: Dr Lynne Leader  REFERRING DIAG: Chronic Rt shoulder pain   THERAPY DIAG:  Chronic right shoulder pain  Other symptoms and signs involving the musculoskeletal system  Rationale for Evaluation and Treatment Rehabilitation  ONSET DATE: 02/02/22  SUBJECTIVE:  SUBJECTIVE STATEMENT: Patients states he had very little pain for about a day after last session. He sees MD next week  PERTINENT HISTORY: Seizure 7/23 with compression fx T12 in TLSO brace for ~ 30 more days - has been in brace for ~ 60 days.  PRECAUTIONS:  LSO donned during therapy, reports 20 lb load restriction 05/03/22  PAIN:  Are you having pain? Yes: NPRS scale: 0/10 Pain location: None today Pain description: None today Aggravating factors: n/a Relieving factors: n/a   PATIENT GOALS get rid of shoulder pain   OBJECTIVE:   Active ROM Right eval Left eval  Shoulder flexion 152 155  Shoulder extension 74 56  Shoulder abduction 160 158  Shoulder adduction    Shoulder internal rotation Thumb T5 Thumb T4  Shoulder external rotation 88 90  Elbow flexion    Elbow extension    Wrist flexion    Wrist extension    Wrist ulnar deviation    Wrist radial deviation    Wrist pronation    Wrist supination    (Blank rows = not tested)  TODAY'S TREATMENT:  05/17/22 UBE L4 x 4 min alt fwd/bkwd  Lower trap setting at wall x 20 Farmers carry 10# x 1 lap Suitcase carry 10# x 1 lap 90/90 carry x 2 laps 8# [place] to 90 degrees with opposite LE holding 5#  2 x 10 Ball on wall CW/CCW 2 x 30 sec each  Manual: PROM and stretching Rt shoulder  Trigger Point Dry-Needling  Treatment instructions: Expect mild to moderate muscle soreness. S/S of pneumothorax if dry needled over a lung field, and to seek immediate medical attention should they occur. Patient verbalized understanding of these instructions and education.  Patient Consent Given: Yes Education handout provided: Previously provided Muscles treated: anterior and middle deltoid Electrical stimulation performed: No Parameters: N/A Treatment response/outcome: twitch response, increased mm length  05/14/22 UBE L4 x 4 min alt fwd/bkwd  Lower trap setting at wall x 15 Serratus clock 2 x 10 red TB Serratus wall push up x 20  Prone ER 2# bilat 3 x 10  Manual: PROM and stretching Rt shoulder  Trigger Point Dry-Needling  Treatment instructions: Expect mild to moderate muscle soreness. S/S of pneumothorax if dry needled over a lung field, and to seek immediate medical attention should they occur. Patient verbalized understanding of these instructions and education.  Patient Consent Given: Yes Education handout provided: Previously provided Muscles treated: anterior and middle deltoid, pecs Electrical stimulation performed: No Parameters: N/A Treatment response/outcome: twitch response, increased mm length    05/10/22: THEREX UBE LEVEL 4 x 4 minutes (2.5 forward/1.5 back)  Prone  L and R shoulder ER with 2 lbs x 15 repetitions  Supine Serratus punches with 3 lbs R and L supine Reactive isometrics x 1 minute R  Standing Feels a "catch" at mid range with shoulder flexion  Pec release using a ball against door frame x 3-4 minutes for STM  MANUAL P/ROM stretching Trigger Point Dry-Needling  Treatment instructions: Expect mild to moderate muscle soreness. S/S of pneumothorax if dry needled over a lung field, and to seek immediate medical attention should they occur. Patient  verbalized understanding of these instructions and education.  Patient Consent Given: Yes Education handout provided: Yes Muscles treated: R biceps, R anterior deltoid Treatment response/outcome: palpable lengthening     PATIENT EDUCATION: Education details: discussed HEP continuation, add walkig Person educated: Patient Education method: Explanation Education comprehension: verbalized understanding  HOME EXERCISE PROGRAM: Access  Code: [email or web address] URL: [email or web address] Date: 04/30/2022 Prepared by: Gillermo Murdoch  Exercises - Shoulder External Rotation and Scapular Retraction  - 3 x daily - 7 x weekly - 1 sets - 10 reps - 3 sec   hold - Doorway Pec Stretch at 60 Degrees Abduction  - 3 x daily - 7 x weekly - 1 sets - 3 reps - Doorway Pec Stretch at 90 Degrees Abduction  - 3 x daily - 7 x weekly - 1 sets - 3 reps - 30 seconds  hold - Doorway Pec Stretch at 120 Degrees Abduction  - 3 x daily - 7 x weekly - 1 sets - 3 reps - 30 second hold  hold - Prone Scapular Slide with Shoulder Extension  - 1 x daily - 7 x weekly - 1 sets - 10 reps - Prone W Scapular Retraction  - 1 x daily - 7 x weekly - 1 sets - 10 reps - Prone Scapular Retraction Y  - 1 x daily - 7 x weekly - 1 sets - 10 reps - Wall Clock with Theraband  - 1 x daily - 7 x weekly - 1 sets - 10 reps - Serratus Activation at Wall  - 1 x daily - 7 x weekly - 2 sets - 10 reps - Supine Transversus Abdominis Bracing with Pelvic Floor Contraction  - 2 x daily - 7 x weekly - 1 sets - 10 reps - 10sec  hold  ASSESSMENT:  CLINICAL IMPRESSION: PT educated pt on adding static holds to improve muscular endurance. Improved ROM and decreased mm spasticity after needling  GOALS: Goals reviewed with patient?  yes   LONG TERM GOALS: Target date: 06/05/2022    Improve posture and alignment with patient to demonstrate improved upright posture with posterior shoulder girdle engaged Baseline:  Goal status: IN PROGRESS  2.   Increase AROM Rt shoulder to =/> than AROM Lt shoulder  Baseline:  Goal status: IN PROGRESS  3.  Decrease bilat shoulder pain by 50-75% Baseline:  Goal status: IN PROGRESS  4.  Patient to report ability to transfer in and out of chairs and turn/roll in bed Baseline: IN PROGRESS  5.  Independent in HEP including aquatic program as indicated  Baseline:  Goal status: IN PROGRESS  6.  Improve functional limitation score to 65 Baseline: 49 Goal status: IN PROGRESS   PLAN: PT FREQUENCY: 2x/week  PT DURATION: 8 weeks  PLANNED INTERVENTIONS: Therapeutic exercises, Therapeutic activity, Neuromuscular re-education, Patient/Family education, Self Care, Joint mobilization, Aquatic Therapy, Dry Needling, Electrical stimulation, Cryotherapy, Moist heat, Taping, Vasopneumatic device, and Ultrasound  PLAN FOR NEXT SESSION: Pt to follow up with MD and call back with next recommended steps  PHYSICAL THERAPY DISCHARGE SUMMARY  Visits from Start of Care: 11  Current functional level related to goals / functional outcomes: Improving strength and ROM   Remaining deficits: pain   Education / Equipment: HEP   Patient agrees to discharge. Patient goals were partially met. Patient is being discharged due to a change in medical status. Isabelle Course, PT,DPT12/14/239:57 AM  Isabelle Course, PT 05/17/2022, 8:38 AM

## 2022-05-20 ENCOUNTER — Ambulatory Visit: Payer: Self-pay

## 2022-05-20 ENCOUNTER — Ambulatory Visit (INDEPENDENT_AMBULATORY_CARE_PROVIDER_SITE_OTHER): Payer: BC Managed Care – PPO | Admitting: Family Medicine

## 2022-05-20 ENCOUNTER — Ambulatory Visit (INDEPENDENT_AMBULATORY_CARE_PROVIDER_SITE_OTHER): Payer: BC Managed Care – PPO

## 2022-05-20 VITALS — BP 138/88 | HR 58 | Ht 70.0 in | Wt 223.0 lb

## 2022-05-20 DIAGNOSIS — G8929 Other chronic pain: Secondary | ICD-10-CM

## 2022-05-20 DIAGNOSIS — M25511 Pain in right shoulder: Secondary | ICD-10-CM | POA: Diagnosis not present

## 2022-05-20 MED ORDER — LORAZEPAM 0.5 MG PO TABS: ORAL_TABLET | ORAL | 0 refills | Status: DC

## 2022-05-20 NOTE — Progress Notes (Signed)
I, Paul [name], LAT, ATC acting as a scribe for Paul Leader, MD.  Paul Aquas. is a 50 y.o. male who presents to [name] today for follow-up right shoulder pain.  This occurs in the setting of a fall resulting in T12 vertebral compression fracture thought to be due to a seizure ultimately due to cerebral venous thrombosis. Patient was last seen by Dr. Georgina [name] on 04/08/2022 and was referred to PT, completing 7 visits. Today, patient reports R shoulder is still painful. Pt feels his AROM has improved, but pain w/ shoulder aBd and ER.   Dx imaging: 05/08/2022 T-spine CT 11/29/08 C-spine MRI  Pertinent review of systems: No fevers or chills  Relevant historical information: Seizure and compression fractures thoracic spine.   Exam:  BP 138/88   Pulse (!) 58   Ht 5\' 10"  (1.778 m)   Wt 223 lb (101.2 kg)   SpO2 98%   BMI 32.00 kg/m  General: Well Developed, well nourished, and in no acute distress.   MSK: Right shoulder normal appearing Mildly tender to palpation. Normal motion pain with abduction. Strength is intact shoulder range of motion. Positive Hawkins and Neer's test. Mildly positive empty can test. Negative Yergason's and speeds test.    Lab and Radiology Results  X-ray images right shoulder obtained today personally and independently interpreted Mildly hooked acromion.  Mild glenohumeral DJD.  No acute fractures are present. Await formal radiology review     Assessment and Plan: 51 y.o. male with right shoulder pain.  Pain is chronic and ongoing for greater than 3 months.  He had a fall with a seizure bad enough to have a thoracic compression fracture.  There may be a rotator cuff tear that I did not see on my original ultrasound.  He has not improved sufficiently with physical therapy.  Plan for MRI of the shoulder to further evaluate source of pain and for potential surgical or injection planning.  If surgery is indicated based  on the MRI will refer directly to surgery if not return to clinic and consider injection or other treatment options.   PDMP reviewed during this encounter. Orders Placed This Encounter  Procedures   DG Shoulder Right    Standing Status:   Future    Number of Occurrences:   1    Standing Expiration Date:   05/21/2023    Order Specific Question:   Reason for Exam (SYMPTOM  OR DIAGNOSIS REQUIRED)    Answer:   eval shoudler pain    Order Specific Question:   Preferred imaging location?    Answer:   [facility]   MR SHOULDER RIGHT WO CONTRAST    Standing Status:   Future    Standing Expiration Date:   05/21/2023    Order Specific Question:   What is the patient's sedation requirement?    Answer:   No Sedation    Order Specific Question:   Does the patient have a pacemaker or implanted devices?    Answer:   No    Order Specific Question:   Preferred imaging location?    Answer:   [facility] (table limit-350lbs)   Meds ordered this encounter  Medications   LORazepam (ATIVAN) 0.5 MG tablet    Sig: 1-2 tabs 30 - 60 min prior to MRI. Do not drive with this medicine.    Dispense:  4 tablet    Refill:  0     Discussed warning signs  or symptoms. Please see discharge instructions. Patient expresses understanding.   The above documentation has been reviewed and is accurate and complete Paul [name], M.D.

## 2022-05-20 NOTE — Patient Instructions (Signed)
Thank you for coming in today.   Please get an Xray today before you leave   Please call [facility] at [phone] to schedule your spine injection.    Based on MRI we can do an injection or surgical referral.

## 2022-05-21 DIAGNOSIS — S22080A Wedge compression fracture of T11-T12 vertebra, initial encounter for closed fracture: Secondary | ICD-10-CM | POA: Diagnosis not present

## 2022-05-23 NOTE — Progress Notes (Signed)
Right shoulder x-ray shows some minimal arthritis changes.

## 2022-05-28 ENCOUNTER — Ambulatory Visit (INDEPENDENT_AMBULATORY_CARE_PROVIDER_SITE_OTHER): Payer: BC Managed Care – PPO

## 2022-05-28 DIAGNOSIS — G8929 Other chronic pain: Secondary | ICD-10-CM

## 2022-05-28 DIAGNOSIS — M25511 Pain in right shoulder: Secondary | ICD-10-CM | POA: Diagnosis not present

## 2022-05-31 ENCOUNTER — Other Ambulatory Visit (HOSPITAL_COMMUNITY): Payer: Self-pay

## 2022-06-03 ENCOUNTER — Encounter: Payer: Self-pay | Admitting: Family Medicine

## 2022-06-03 ENCOUNTER — Other Ambulatory Visit: Payer: Self-pay

## 2022-06-03 ENCOUNTER — Other Ambulatory Visit (HOSPITAL_COMMUNITY): Payer: Self-pay

## 2022-06-03 DIAGNOSIS — G8929 Other chronic pain: Secondary | ICD-10-CM

## 2022-06-03 NOTE — Progress Notes (Signed)
MRI of the shoulder does show some mild rotator cuff tendinitis.  There is also arthritis changes of the small joint at the top of the shoulder the [facility] joint.  The most significant thing is a labrum tear which could require surgery especially with popping and clicking and persistent pain.  Would you like to talk to a surgeon now to potentially have surgery before the end of the year?  If so let me know.  Otherwise return to clinic to talk about results in full detail.  I could refer you directly to orthopedic surgery for surgical discussion to get that surgery done before the end of the year.

## 2022-06-04 ENCOUNTER — Other Ambulatory Visit (HOSPITAL_COMMUNITY): Payer: Self-pay

## 2022-06-07 ENCOUNTER — Ambulatory Visit (INDEPENDENT_AMBULATORY_CARE_PROVIDER_SITE_OTHER): Payer: BC Managed Care – PPO | Admitting: Orthopedic Surgery

## 2022-06-07 ENCOUNTER — Encounter: Payer: Self-pay | Admitting: Orthopedic Surgery

## 2022-06-07 DIAGNOSIS — S43431A Superior glenoid labrum lesion of right shoulder, initial encounter: Secondary | ICD-10-CM | POA: Diagnosis not present

## 2022-06-07 DIAGNOSIS — M19011 Primary osteoarthritis, right shoulder: Secondary | ICD-10-CM

## 2022-06-07 MED ORDER — BUPIVACAINE HCL 0.25 % IJ SOLN
0.6600 mL | INTRAMUSCULAR | Status: AC | PRN
  Administered 2022-06-07: .66 mL via INTRA_ARTICULAR

## 2022-06-07 NOTE — Progress Notes (Signed)
Office Visit Note   Patient: Paul [name].           Date of Birth: July 13, 1971           MRN: [id number] Visit Date: 06/07/2022 Requested by: Rodolph Bong, MD [address] [place],  [place] [address] PCP: Merri Brunette, MD  Subjective: Chief Complaint  Patient presents with   Right Shoulder - Pain    HPI: Paul [name]. is a 51 y.o. male who presents to the office reporting right shoulder pain.  Has been ongoing for several years.  Has been worse recently over the last 6 months after he has had to wear back brace for a back injury.  His symptoms started when he sustained a traumatic injury to the right shoulder a couple of years ago when he was holding on with his body weight after falling from a ladder.  He is right-hand dominant.  No prior shoulder surgery.  It is painful for him to lay on the left-hand side.  Reports some radiation of the pain to but not below the elbow.  Also has mild to moderate neck pain with activity.  Denies any numbness and tingling in the arm or hand.  It does hurt him to throw a football.  He has tried physical therapy which did not help with the pain but did help with his range of motion.  Any type of twisting motion is painful.  Does take aspirin and Tylenol as needed.  Tumeric has helped but he did have a seizure in July and he stopped all medication at that time..                ROS: All systems reviewed are negative as they relate to the chief complaint within the history of present illness.  Patient denies fevers or chills.  Assessment & Plan: Visit Diagnoses:  1. Arthritis of right acromioclavicular joint   2. Tear of right glenoid labrum, initial encounter     Plan: Impression is right shoulder AC joint arthritis and labral tear.  Plan is that the patient does not want any type of cortisone injection.  On the MRI scan which is reviewed he does have a posterior superior labral tear as well as some AC joint arthritis.  Diagnostic lidocaine  injection performed into the joint under ultrasound guidance today.  I plan to reach out to him to see how he did with that diagnostic injection as a determinant of whether or not we should perform distal clavicle excision at the same time as arthroscopy debridement and biceps tenodesis.  I think his symptoms are consistent with superior labral pathology.  Does have some mechanical symptoms.  Operative plan at this time would be arthroscopy with superior labral debridement and biceps tenodesis.  Expected rehab as well as the risk and benefits are discussed with the patient.   Just talk with Alfredo Bach and he did get substantial relief from the lidocaine injection into his [facility] joint.  Therefore we will plan for arthroscopy and biceps tenodesis and distal clavicle excision.  Risk and benefits discussed.  All questions answered. Follow-Up Instructions: No follow-ups on file.   Orders:  No orders of the defined types were placed in this encounter.  No orders of the defined types were placed in this encounter.     Procedures: Medium Joint Inj: R acromioclavicular on 06/07/2022 5:59 PM Indications: diagnostic evaluation and pain Details: 25 G 1.5 in needle, ultrasound-guided superior approach Medications: 0.66  mL bupivacaine 0.25 % Outcome: tolerated well, no immediate complications Procedure, treatment alternatives, risks and benefits explained, specific risks discussed. Consent was given by the patient. Immediately prior to procedure a time out was called to verify the correct patient, procedure, equipment, support staff and site/side marked as required. Patient was prepped and draped in the usual sterile fashion.       Clinical Data: No additional findings.  Objective: Vital Signs: There were no vitals taken for this visit.  Physical Exam:  Constitutional: Patient appears well-developed HEENT:  Head: Normocephalic Eyes:EOM are normal Neck: Normal range of motion Cardiovascular: Normal  rate Pulmonary/chest: Effort normal Neurologic: Patient is alert Skin: Skin is warm Psychiatric: Patient has normal mood and affect  Ortho Exam: Ortho exam demonstrates mild to moderate tenderness right AC joint versus left.  He has excellent range of motion bilaterally to 75/105/170.  Negative apprehension relocation testing on the right.  Positive O'Brien's testing and speeds testing on the right.  Does have some popping in the right [facility] joint more so than the left.  Mild biceps tenderness but most of his pain seems to be posterior superior and is worse with crossarm adduction.  Does have a little bit of discomfort with labral load testing.  Specialty Comments:  No specialty comments available.  Imaging: No results found.   PMFS History: Patient Active Problem List   Diagnosis Date Noted   Cerebral venous thrombosis of cortical vein 03/26/2022   Confusion 11/28/2020   Partial symptomatic epilepsy with complex partial seizures, not intractable, without status epilepticus ([facility]) 11/28/2020   Choledocholithiasis 04/19/2013   CAP (community acquired pneumonia) 04/19/2013   Dehydration 04/19/2013   Cholecystitis, acute s/p open cholecystectomy 11/20/12 11/02/2012   Past Medical History:  Diagnosis Date   Abdominal pain    Cholelithiasis    History of kidney stones    Insomnia    Syncope     Family History  Problem Relation Age of Onset   Healthy Mother    Lung cancer Father     Past Surgical History:  Procedure Laterality Date   CHOLECYSTECTOMY N/A 11/20/2012   Procedure: LAPAROSCOPIC CHOLECYSTECTOMY converted to open cholecystectomy with  CHOLANGIOGRAM;  Surgeon: Adolph Pollack, MD;  Location: [facility];  Service: General;  Laterality: N/A;   ERCP N/A 11/22/2012   Procedure: ENDOSCOPIC RETROGRADE CHOLANGIOPANCREATOGRAPHY (ERCP);  Surgeon: Petra Kuba, MD;  Location: [facility];  Service: Endoscopy;  Laterality: N/A;   ERCP N/A 04/21/2013   Procedure: ENDOSCOPIC RETROGRADE  CHOLANGIOPANCREATOGRAPHY (ERCP);  Surgeon: Petra Kuba, MD;  Location: [facility];  Service: Endoscopy;  Laterality: N/A;   ESOPHAGOGASTRODUODENOSCOPY N/A 01/01/2013   Procedure: ESOPHAGOGASTRODUODENOSCOPY (EGD);  Surgeon: Petra Kuba, MD;  Location: [facility] ENDOSCOPY;  Service: Endoscopy;  Laterality: N/A;   EXTRACORPOREAL SHOCK WAVE LITHOTRIPSY Right 12/31/2021   Procedure: EXTRACORPOREAL SHOCK WAVE LITHOTRIPSY (ESWL);  Surgeon: Jerilee Field, MD;  Location: [facility];  Service: Urology;  Laterality: Right;   HERNIA REPAIR     Childhood./ inguinal   WISDOM TOOTH EXTRACTION     Social History   Occupational History   Occupation: Airline pilot  Tobacco Use   Smoking status: Never   Smokeless tobacco: Never  Substance and Sexual Activity   Alcohol use: Yes    Comment: 1-2 drinks/week all of the above   Drug use: Never   Sexual activity: Never

## 2022-06-10 NOTE — Telephone Encounter (Signed)
It looks like based on the last note, Dr. August Saucer spoke with Alfredo Bach and they are planning on surgery

## 2022-06-11 DIAGNOSIS — R569 Unspecified convulsions: Secondary | ICD-10-CM | POA: Diagnosis not present

## 2022-06-19 ENCOUNTER — Encounter: Payer: Self-pay | Admitting: Neurology

## 2022-06-19 ENCOUNTER — Ambulatory Visit (INDEPENDENT_AMBULATORY_CARE_PROVIDER_SITE_OTHER): Payer: BC Managed Care – PPO | Admitting: Neurology

## 2022-06-19 VITALS — BP 128/83 | HR 62 | Ht 70.0 in | Wt 219.0 lb

## 2022-06-19 DIAGNOSIS — R569 Unspecified convulsions: Secondary | ICD-10-CM | POA: Diagnosis not present

## 2022-06-19 DIAGNOSIS — G08 Intracranial and intraspinal phlebitis and thrombophlebitis: Secondary | ICD-10-CM | POA: Diagnosis not present

## 2022-06-19 MED ORDER — LEVETIRACETAM 500 MG PO TABS: ORAL_TABLET | ORAL | 3 refills | Status: DC

## 2022-06-19 NOTE — Progress Notes (Signed)
NEUROLOGY FOLLOW UP OFFICE NOTE  Paul [name] YX:[id number] 1971-01-19  HISTORY OF PRESENT ILLNESS: I had the pleasure of seeing Paul [name]. in follow-up in the neurology clinic on 06/19/2022. He is alone in the office today. The patient was last seen 3 months ago for new onset seizures. He had a nocturnal convulsion on 02/02/22. Prior to this, he had an episode of facial twitching and an episode of aphasia in 2022. 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 He had a follow-up MRV last week showing small vein in the region of the left post-central sulcus that may be partially thrombosed, no new changes. He has kindly been seen by Hematology. Hypercoagulable workup showed a mildly elevated homocysteine of 15.9, antithrombin III of 126, otherwise normal. Per notes, these levels are not elevated enough to meet criteria for a clotting disorder. 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 He is on Levetiracetam 500mg  BID with no seizures or seizure-like symptoms since 01/2022. He denies any staring/unresponsive episodes, gaps in time, olfactory/gustatory hallucinations, focal numbness/tingling/weakness, aphasia, myoclonic jerks. He does not have headaches on a regular basis but today had a right-sided headache. He reports work has been busy. He feels a little more agitated with the Levetiracetam, but he can rein it in. He denies any dizziness, vision changes, no falls. Sleep is good. He is scheduled for right shoulder surgery this month.    History on Initial Assessment 02/07/2022: This is  51 year old right-handed man with a history of ureteral stone, presenting for evaluation of seizures. He was in the ER on 02/02/2022 after a nocturnal seizure witnessed by his wife. He felt fine the night prior and went to bed at 1:30am, then at 5:30am his wife heard his call out and saw  his arms moving up and down. She could not wake him up, then after 60 seconds he went slack with sonorous respirations. He woke up to EMS around him, his wife reported he woke up after 10 minutes but was confused and groggy. He recalls being able to answer some questions but not all correctly, no tongue bite, incontinence, or focal weakness. He felt sore in his back and calves. Bloodwork showed a creatinine of 1.27, EtOH and UDS negative. I personally reviewed head CT without contrast which did not show any acute changes. A CT lumbar spine without contrast showed an acute T12 superior endplate compression fracture with 20% loss of vertebral body height. He was discharged home on Levetiracetam 500mg  BID.   He was evaluated by neurologist Dr. Krista Blue in May 2022 after he had an episode in February 2022 while fixing his daughter's hair, he suddenly felt cloudy, dizzy, and tried to find a place to rest, when he suddenly fell to the floor on his right side and his wife noticed facial twitching. He was out briefly and took 30 minutes to clear up. Around 2 weeks later, he was at work talking on the phone when he felt an abnormal sensation, he was able to understand the conversation but had trouble finding and pronouncing his words for 3 minutes. There was no associated headache or any other symptoms, no loss of consciousness. Dr. Krista Blue ordered an MRI and EEG, and prescribed Lamotrigine, however he did not start it and did not complete the studies. He denies any similar episodes since then. He and his  brother-in-law deny any staring/unresponsive episodes, gaps in time, olfactory/gustatory hallucinations, deja vu, rising epigastric sensation, focal numbness/tingling/weakness, myoclonic jerks. Since the seizure, Paul [name] has noticed he is slower to formulate his words, it is not very noticeable, but Paul [name] notes he is more deliberate. He denies any headaches, dizziness, vision changes, dysarthria/dysphagia, neck pain, bowel/bladder  dysfunction. He has no prior history of back pain, but since the seizure, his back has been bothering him, he was given Roxicodone in the ER. He has been referred to [facility] and is awaiting an appointment. He has never been a good sleeper, usually getting 5 hours of sleep, but had no recent change in sleep pattern. He has prn Zaleplon. He drinks 1-2 alcohol beverages a week and had a beer the night prior to the seizure. Mood is pretty good, he may be a little more irritable. Memory is okay. He works in Airline pilot.    Epilepsy Risk Factors:  He had a normal birth and early development.  There is no history of febrile convulsions, CNS infections such as meningitis/encephalitis, significant traumatic brain injury, neurosurgical procedures, or family history of seizures.  Diagnostic Data: EEG 02/2022: normal wake and drowsy EEG  PAST MEDICAL HISTORY: Past Medical History:  Diagnosis Date   Abdominal pain    Cholelithiasis    History of kidney stones    Insomnia    Syncope     MEDICATIONS: Current Outpatient Medications on File Prior to Visit  Medication Sig Dispense Refill   Ascorbic Acid (VITAMIN C) 500 MG CHEW 1 tablet     dabigatran (PRADAXA) 150 MG CAPS capsule Take 1 capsule by mouth 2 times daily. (Take first dose of Pradaxa (dabigatran) 12 hours AFTER last dose of Eliquis) 60 capsule 6   levETIRAcetam (KEPPRA) 500 MG tablet Take 1 tablet twice a day 60 tablet 11   LORazepam (ATIVAN) 0.5 MG tablet 1-2 tabs 30 - 60 min prior to MRI. Do not drive with this medicine. 4 tablet 0   zaleplon (SONATA) 10 MG capsule 1 capsule at bedtime as needed     No current facility-administered medications on file prior to visit.    ALLERGIES: Allergies  Allergen Reactions   Dilaudid [Hydromorphone Hcl] Anaphylaxis    Appears to tolerate morphine and oxycodone.   Dilaudid [Hydromorphone] Anaphylaxis   Lunesta [Eszopiclone] Other (See Comments)    Bad taste    FAMILY HISTORY: Family  History  Problem Relation Age of Onset   Healthy Mother    Lung cancer Father     SOCIAL HISTORY: Social History   Socioeconomic History   Marital status: Married    Spouse name: Not on file   Number of children: 3   Years of education: college   Highest education level: Not on file  Occupational History   Occupation: sales  Tobacco Use   Smoking status: Never   Smokeless tobacco: Never  Substance and Sexual Activity   Alcohol use: Yes    Comment: 1-2 drinks/week all of the above   Drug use: Never   Sexual activity: Never  Other Topics Concern   Not on file  Social History Narrative   Lives at home with family.two story home   Right-handed.   Caffeine use: 2 cups per day.   Social Determinants of Health   Financial Resource Strain: Not on file  Food Insecurity: Not on file  Transportation Needs: Not on file  Physical Activity: Not on file  Stress: Not on file  Social Connections: Not on  file  Intimate Partner Violence: Not on file     PHYSICAL EXAM: Vitals:   06/19/22 1149  BP: 128/83  Pulse: 62  SpO2: 98%   General: No acute distress Head:  Normocephalic/atraumatic Skin/Extremities: No rash, no edema Neurological Exam: alert and awake. No aphasia or dysarthria. Fund of knowledge is appropriate.  Attention and concentration are normal.   Cranial nerves: Pupils equal, round. Extraocular movements intact with no nystagmus. Visual fields full.  No facial asymmetry.  Motor: Bulk and tone normal, muscle strength 5/5 throughout with no pronator drift.   Finger to nose testing intact.  Gait narrow-based and steady, able to tandem walk adequately.  Romberg negative.   IMPRESSION: This is  51 yo RH man with a history of ureteral stone, with new onset seizure on 02/02/2022. He had an episode of loss of consciousness with facial twitching in February 2022 and a transient episode of word-finding difficulties in March 2022. 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 No edema. EEG normal. We reviewed repeat MRV done last week which did not show any new changes, again note of small vein in the region of the left post-central sulcus that may be partially thrombosed. No seizures since 01/2022, continue Levetiracetam 500mg  BID. Continue Pradaxa for CVT, follow-up with Hematology as scheduled. He is aware of [place] driving laws to stop driving until 6 months seizure-free. Follow-up in 6 months, call for any changes.    Thank you for allowing me to participate in his care.  Please do not hesitate to call for any questions or concerns.    Ellouise Newer, M.D.   CC: Dr. Shelia Media

## 2022-06-19 NOTE — Patient Instructions (Signed)
Good to see you doing well. Continue all your medications. Follow-up with Hematology as scheduled.  Follow-up with me in 6 months, call for any changes.    Seizure Precautions: 1. If medication has been prescribed for you to prevent seizures, take it exactly as directed.  Do not stop taking the medicine without talking to your doctor first, even if you have not had a seizure in a long time.   2. Avoid activities in which a seizure would cause danger to yourself or to others.  Don't operate dangerous machinery, swim alone, or climb in high or dangerous places, such as on ladders, roofs, or girders.  Do not drive unless your doctor says you may.  3. If you have any warning that you may have a seizure, lay down in a safe place where you can't hurt yourself.    4.  No driving for 6 months from last seizure, as per [facility].   Please refer to the following link on the Epile[employer] of [employer]'s website for more information: http://www.epilepsyfoundation.org/answerplace/Social/driving/drivingu.cfm   5.  Maintain good sleep hygiene. Avoid alcohol.  6.  Contact your doctor if you have any problems that may be related to the medicine you are taking.  7.  Call 911 and bring the patient back to the ED if:        A.  The seizure lasts longer than 5 minutes.       B.  The patient doesn't awaken shortly after the seizure  C.  The patient has new problems such as difficulty seeing, speaking or moving  D.  The patient was injured during the seizure  E.  The patient has a temperature over 102 F (39C)  F.  The patient vomited and now is having trouble breathing

## 2022-06-26 ENCOUNTER — Telehealth: Payer: Self-pay | Admitting: Neurology

## 2022-06-26 NOTE — Telephone Encounter (Signed)
Spoke with Debbie informed her that the Pradaxa is prescribed by his Hematologist, would check with them. Dr Karel Jarvis  will fill out the seizure part on his form.

## 2022-06-26 NOTE — Telephone Encounter (Signed)
Debbie with Dr.Dean's office called, she needs medical clearance so the patient can have surgery 07/12/22. She needs to know instructions on Pradaxa too. Any questions you can call her [phone]

## 2022-06-26 NOTE — Telephone Encounter (Signed)
Pls let her know that the Pradaxa is prescribed by his Hematologist, would check with them. I will fill out the seizure part on his form. Thanks

## 2022-07-01 ENCOUNTER — Other Ambulatory Visit (HOSPITAL_COMMUNITY): Payer: Self-pay

## 2022-07-03 ENCOUNTER — Other Ambulatory Visit (HOSPITAL_COMMUNITY): Payer: Self-pay

## 2022-07-03 ENCOUNTER — Encounter: Payer: Self-pay | Admitting: Physician Assistant

## 2022-07-04 ENCOUNTER — Other Ambulatory Visit: Payer: Self-pay | Admitting: Physician Assistant

## 2022-07-04 MED ORDER — ENOXAPARIN SODIUM 100 MG/ML IJ SOSY: 100.0000 mg | PREFILLED_SYRINGE | Freq: Two times a day (BID) | INTRAMUSCULAR | 0 refills | Status: DC

## 2022-07-04 NOTE — Pre-Procedure Instructions (Signed)
Surgical Instructions    Your procedure is scheduled on Friday, December 22nd.  Report to [facility] Main Entrance "A" at 09:30 A.M., then check in with the Admitting office.  Call this number if you have problems the morning of surgery:  [phone]   If you have any questions prior to your surgery date call [phone]: Open Monday-Friday 8am-4pm    Remember:  Do not eat after midnight the night before your surgery  You may drink clear liquids until 08:30 AM the morning of your surgery.   Clear liquids allowed are: Water, Non-Citrus Juices (without pulp), Carbonated Beverages, Clear Tea, Black Coffee Only (NO MILK, CREAM OR POWDERED CREAMER of any kind), and Gatorade.   Patient Instructions  The night before surgery:  No food after midnight. ONLY clear liquids after midnight  The day of surgery (if you do NOT have diabetes):  Drink ONE (1) Pre-Surgery Clear Ensure by 08:30 AM the morning of surgery. Drink in one sitting. Do not sip.  This drink was given to you during your hospital  pre-op appointment visit.  Nothing else to drink after completing the  Pre-Surgery Clear Ensure.          If you have questions, please contact your surgeon's office.     Take these medicines the morning of surgery with A SIP OF WATER  levETIRAcetam (KEPPRA)     Follow your surgeon's instructions on when to stop dabigatran (PRADAXA).  If no instructions were given by your surgeon then you will need to call the office to get those instructions.      As of today, STOP taking any Aspirin (unless otherwise instructed by your surgeon) Aleve, Naproxen, Ibuprofen, Motrin, Advil, Goody's, BC's, all herbal medications, fish oil, and all vitamins.                     Do NOT Smoke (Tobacco/Vaping) for 24 hours prior to your procedure.  If you use a CPAP at night, you may bring your mask/headgear for your overnight stay.   Contacts, glasses, piercing's, hearing aid's, dentures or partials may not  be worn into surgery, please bring cases for these belongings.    For patients admitted to the hospital, discharge time will be determined by your treatment team.   Patients discharged the day of surgery will not be allowed to drive home, and someone needs to stay with them for 24 hours.  SURGICAL WAITING ROOM VISITATION Patients having surgery or a procedure may have no more than 2 support people in the waiting area - these visitors may rotate.   Children under the age of 28 must have an adult with them who is not the patient. If the patient needs to stay at the hospital during part of their recovery, the visitor guidelines for inpatient rooms apply. Pre-op nurse will coordinate an appropriate time for 1 support person to accompany patient in pre-op.  This support person may not rotate.   Please refer to the [facility] website for the visitor guidelines for Inpatients (after your surgery is over and you are in a regular room).    Special instructions:   - Preparing For Surgery  Before surgery, you can play an important role. Because skin is not sterile, your skin needs to be as free of germs as possible. You can reduce the number of germs on your skin by washing with CHG (chlorahexidine gluconate) Soap before surgery.  CHG is an antiseptic cleaner which kills germs and bonds  with the skin to continue killing germs even after washing.    Oral Hygiene is also important to reduce your risk of infection.  Remember - BRUSH YOUR TEETH THE MORNING OF SURGERY WITH YOUR REGULAR TOOTHPASTE  Please do not use if you have an allergy to CHG or antibacterial soaps. If your skin becomes reddened/irritated stop using the CHG.  Do not shave (including legs and underarms) for at least 48 hours prior to first CHG shower. It is OK to shave your face.  Please follow these instructions carefully.   Shower the NIGHT BEFORE SURGERY and the MORNING OF SURGERY  If you chose to wash your hair, wash  your hair first as usual with your normal shampoo.  After you shampoo, rinse your hair and body thoroughly to remove the shampoo.  Use CHG Soap as you would any other liquid soap. You can apply CHG directly to the skin and wash gently with a scrungie or a clean washcloth.   Apply the CHG Soap to your body ONLY FROM THE NECK DOWN.  Do not use on open wounds or open sores. Avoid contact with your eyes, ears, mouth and genitals (private parts). Wash Face and genitals (private parts)  with your normal soap.   Wash thoroughly, paying special attention to the area where your surgery will be performed.  Thoroughly rinse your body with warm water from the neck down.  DO NOT shower/wash with your normal soap after using and rinsing off the CHG Soap.  Pat yourself dry with a CLEAN TOWEL.  Wear CLEAN PAJAMAS to bed the night before surgery  Place CLEAN SHEETS on your bed the night before your surgery  DO NOT SLEEP WITH PETS.   Day of Surgery: Take a shower with CHG soap. Do not wear jewelry or makeup Do not wear lotions, powders,colognes, or deodorant. Men may shave face and neck. Do not bring valuables to the hospital. [facility] is not responsible for any belongings or valuables.  Wear Clean/Comfortable clothing the morning of surgery Remember to brush your teeth WITH YOUR REGULAR TOOTHPASTE.   Please read over the following fact sheets that you were given.    If you received a COVID test during your pre-op visit  it is requested that you wear a mask when out in public, stay away from anyone that may not be feeling well and notify your surgeon if you develop symptoms. If you have been in contact with anyone that has tested positive in the last 10 days please notify you surgeon.

## 2022-07-04 NOTE — Progress Notes (Signed)
Sent prescription for Lovenox 1 mg/kg q 12 hours. Recommend to hold pradaxa 72 hours and start lovenox bridge. He needs to hold lovenox 12 hours before surgery.

## 2022-07-05 ENCOUNTER — Encounter (HOSPITAL_COMMUNITY): Payer: Self-pay

## 2022-07-05 ENCOUNTER — Encounter (HOSPITAL_COMMUNITY)
Admission: RE | Admit: 2022-07-05 | Discharge: 2022-07-05 | Disposition: A | Discharge status: Home / Self Care | Payer: BC Managed Care – PPO | Source: Ambulatory Visit | Attending: Orthopedic Surgery | Admitting: Orthopedic Surgery

## 2022-07-05 ENCOUNTER — Other Ambulatory Visit: Payer: Self-pay

## 2022-07-05 ENCOUNTER — Other Ambulatory Visit (HOSPITAL_COMMUNITY): Payer: Self-pay

## 2022-07-05 DIAGNOSIS — Z01812 Encounter for preprocedural laboratory examination: Secondary | ICD-10-CM | POA: Insufficient documentation

## 2022-07-05 DIAGNOSIS — Z01818 Encounter for other preprocedural examination: Secondary | ICD-10-CM

## 2022-07-05 HISTORY — DX: Claustrophobia: F40.240

## 2022-07-05 HISTORY — DX: Primary osteoarthritis, unspecified shoulder: M19.019

## 2022-07-05 LAB — CBC
HCT: 39.8 % (ref 39.0–52.0)
Hemoglobin: 14.1 g/dL (ref 13.0–17.0)
MCH: 31.5 pg (ref 26.0–34.0)
MCHC: 35.4 g/dL (ref 30.0–36.0)
MCV: 89 fL (ref 80.0–100.0)
Platelets: 206 10*3/uL (ref 150–400)
RBC: 4.47 MIL/uL (ref 4.22–5.81)
RDW: 13.2 % (ref 11.5–15.5)
WBC: 7.6 10*3/uL (ref 4.0–10.5)
nRBC: 0 % (ref 0.0–0.2)

## 2022-07-05 LAB — BASIC METABOLIC PANEL
Anion gap: 8 (ref 5–15)
BUN: 12 mg/dL (ref 6–20)
CO2: 26 mmol/L (ref 22–32)
Calcium: 9.5 mg/dL (ref 8.9–10.3)
Chloride: 107 mmol/L (ref 98–111)
Creatinine, Ser: 1.1 mg/dL (ref 0.61–1.24)
GFR, Estimated: >60 mL/min (ref >60)
Glucose, Bld: 102 mg/dL — ABNORMAL HIGH (ref 70–99)
Potassium: 4.1 mmol/L (ref 3.5–5.1)
Sodium: 141 mmol/L (ref 135–145)

## 2022-07-05 NOTE — Progress Notes (Signed)
PCP - Dr. Merri Brunette Cardiologist - denies  PPM/ICD - denies   Chest x-ray - 04/19/13 EKG - 02/05/22 Stress Test - denies ECHO - denies Cardiac Cath - denies  Sleep Study - denies   DM- denies    Blood Thinner Instructions: Hold Pradaxa 72 hours prior to surgery. Last dose 12/19. Start Lovenox every 12 hours. Hold Lovenox 12 hours before surgery  Aspirin Instructions: n/a  ERAS Protcol - yes PRE-SURGERY Ensure given at PAT  COVID TEST- n/a   Anesthesia review: no  Patient denies shortness of breath, fever, cough and chest pain at PAT appointment   All instructions explained to the patient, with a verbal understanding of the material. Patient agrees to go over the instructions while at home for a better understanding. The opportunity to ask questions was provided.

## 2022-07-06 ENCOUNTER — Other Ambulatory Visit (HOSPITAL_COMMUNITY): Payer: Self-pay

## 2022-07-08 NOTE — Anesthesia Preprocedure Evaluation (Addendum)
Anesthesia Evaluation  Patient identified by MRN, date of birth, ID band Patient awake    Reviewed: Allergy & Precautions, NPO status , Patient's Chart, lab work & pertinent test results  Airway Mallampati: II  TM Distance: >3 FB Neck ROM: Full    Dental no notable dental hx.    Pulmonary neg pulmonary ROS   Pulmonary exam normal breath sounds clear to auscultation       Cardiovascular Normal cardiovascular exam Rhythm:Regular Rate:Normal  EKG 02/02/22: Sinus rhythm. Rate 80. Low voltage precordial leads.   Coronary calcium score 09/27/20 (care everywhere): FINDINGS:   - Total Calcium Score: 0.  -- Left Main: 0.  -- Left Anterior Descending: 0.  -- Circumflex: 0.  -- Right Coronary: 0  -- Posterior Descending: 0.   - Cardiovascular:  Normal heart size. No pericardial effusion.  - Mediastinum: No adenopathy.  - Visible abdomen: Unremarkable.  - Visible lung fields: Unremarkable.  - Miscellaneous: None.       Neuro/Psych Seizures - (keppra),  PSYCHIATRIC DISORDERS Anxiety      cortical vein thrombosis    GI/Hepatic negative GI ROS, Neg liver ROS,,,  Endo/Other  negative endocrine ROS    Renal/GU negative Renal ROS  negative genitourinary   Musculoskeletal  (+) Arthritis ,    Abdominal   Peds  Hematology  (+) Blood dyscrasia (on pradaxa)   Anesthesia Other Findings   Reproductive/Obstetrics                             Anesthesia Physical Anesthesia Plan  ASA: 3  Anesthesia Plan: General and Regional   Post-op Pain Management: Regional block* and Tylenol PO (pre-op)*   Induction: Intravenous  PONV Risk Score and Plan: 2 and Midazolam, Dexamethasone and Ondansetron  Airway Management Planned: Oral ETT  Additional Equipment:   Intra-op Plan:   Post-operative Plan: Extubation in OR  Informed Consent: I have reviewed the patients History and Physical, chart, labs and  discussed the procedure including the risks, benefits and alternatives for the proposed anesthesia with the patient or authorized representative who has indicated his/her understanding and acceptance.     Dental advisory given  Plan Discussed with: CRNA  Anesthesia Plan Comments:         Anesthesia Quick Evaluation

## 2022-07-08 NOTE — Progress Notes (Signed)
Anesthesia Chart Review:  Follows with neurology for history of seizures. New onset seizure on 02/02/2022. He reportedly had an episode of loss of consciousness with facial twitching in February 2022 and a transient episode of word-finding difficulties in March 2022. 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 No edema. EEG normal. Follow up MRV 06/11/22 did not show any new changes, again note of small vein in the region of the left post-central sulcus that may be partially thrombosed. No seizures since 01/2022. Last seen by Dr. Karel Jarvis 06/19/22 and recommended to continue Levetiracetam 500mg  BID. Continue Pradaxa for CVT, follow-up with Hematology as scheduled. Follow up in 6 months.  Pt is on 12 months of Pradaxa for unprovoked venous thrombosis, followed by hematologyHypercoagulable workup was unremarkable except for mild elevation of homocysteine level 15.9 umol/L, antithrombin III function at 126%. Per notes, these levels are not elevated enough to meet criteria for a clotting disorder. Pt has perioperative lovenox briding instructions per telephone encounter by , PA-C 07/04/22, "Sent prescription for Lovenox 1 mg/kg q 12 hours. Recommend to hold pradaxa 72 hours and start lovenox bridge. He needs to hold lovenox 12 hours before surgery. "  Preop labs reviewed, unremarkable.  EKG 02/02/22: Sinus rhythm. Rate 80. Low voltage precordial leads.  Coronary calcium score 09/27/20 (care everywhere): FINDINGS:   - Total Calcium Score: 0.  -- Left Main: 0.  -- Left Anterior Descending: 0.  -- Circumflex: 0.  -- Right Coronary: 0  -- Posterior Descending: 0.   - Cardiovascular:  Normal heart size. No pericardial effusion.  - Mediastinum: No adenopathy.  - Visible abdomen: Unremarkable.  - Visible lung fields: Unremarkable.  - Miscellaneous: None.     11/27/20 [facility]  Short Stay Center/Anesthesiology Phone [phone] 07/08/2022 9:25 AM

## 2022-07-09 ENCOUNTER — Other Ambulatory Visit (HOSPITAL_COMMUNITY): Payer: Self-pay

## 2022-07-09 ENCOUNTER — Other Ambulatory Visit: Payer: Self-pay

## 2022-07-10 DIAGNOSIS — R569 Unspecified convulsions: Secondary | ICD-10-CM | POA: Diagnosis not present

## 2022-07-11 ENCOUNTER — Other Ambulatory Visit: Payer: Self-pay

## 2022-07-11 ENCOUNTER — Telehealth: Payer: Self-pay | Admitting: Orthopedic Surgery

## 2022-07-11 NOTE — Telephone Encounter (Signed)
Patient is having surgery tomorrow -right shoulder arthroscopy debridement biceps tenodesis distal clavicle excision and wants to know if Dr. August Saucer can write a script for Physical therapy  so the sessions can be paid for this year.  He would like to use [facility]  [facility] in [facility].  Please call patient with any questions regarding this request  [phone]

## 2022-07-12 ENCOUNTER — Ambulatory Visit (HOSPITAL_COMMUNITY): Payer: BC Managed Care – PPO | Admitting: Vascular Surgery

## 2022-07-12 ENCOUNTER — Encounter (HOSPITAL_COMMUNITY): Payer: Self-pay | Admitting: Orthopedic Surgery

## 2022-07-12 ENCOUNTER — Other Ambulatory Visit: Payer: Self-pay

## 2022-07-12 ENCOUNTER — Encounter (HOSPITAL_COMMUNITY)
Admission: RE | Disposition: A | Discharge status: Home / Self Care | Payer: Self-pay | Source: Home / Self Care | Attending: Orthopedic Surgery

## 2022-07-12 ENCOUNTER — Ambulatory Visit (HOSPITAL_COMMUNITY)
Admission: RE | Admit: 2022-07-12 | Discharge: 2022-07-12 | Disposition: A | Discharge status: Home / Self Care | Payer: BC Managed Care – PPO | Attending: Orthopedic Surgery | Admitting: Orthopedic Surgery

## 2022-07-12 DIAGNOSIS — Z7901 Long term (current) use of anticoagulants: Secondary | ICD-10-CM | POA: Diagnosis not present

## 2022-07-12 DIAGNOSIS — G8918 Other acute postprocedural pain: Secondary | ICD-10-CM | POA: Diagnosis not present

## 2022-07-12 DIAGNOSIS — M19011 Primary osteoarthritis, right shoulder: Secondary | ICD-10-CM | POA: Insufficient documentation

## 2022-07-12 DIAGNOSIS — Z7982 Long term (current) use of aspirin: Secondary | ICD-10-CM | POA: Insufficient documentation

## 2022-07-12 DIAGNOSIS — S43431A Superior glenoid labrum lesion of right shoulder, initial encounter: Secondary | ICD-10-CM

## 2022-07-12 DIAGNOSIS — Z01818 Encounter for other preprocedural examination: Secondary | ICD-10-CM

## 2022-07-12 DIAGNOSIS — G40909 Epilepsy, unspecified, not intractable, without status epilepticus: Secondary | ICD-10-CM | POA: Diagnosis not present

## 2022-07-12 DIAGNOSIS — F419 Anxiety disorder, unspecified: Secondary | ICD-10-CM | POA: Diagnosis not present

## 2022-07-12 HISTORY — PX: SHOULDER ARTHROSCOPY WITH OPEN ROTATOR CUFF REPAIR AND DISTAL CLAVICLE ACROMINECTOMY: SHX5683

## 2022-07-12 SURGERY — SHOULDER ARTHROSCOPY WITH OPEN ROTATOR CUFF REPAIR AND DISTAL CLAVICLE ACROMINECTOMY
Anesthesia: Regional | Site: Shoulder | Laterality: Right

## 2022-07-12 MED ORDER — LACTATED RINGERS IV SOLN: INTRAVENOUS | Status: DC

## 2022-07-12 MED ORDER — EPINEPHRINE PF 1 MG/ML IJ SOLN
INTRAMUSCULAR | Status: DC | PRN
  Administered 2022-07-12: .15 mL

## 2022-07-12 MED ORDER — PROPOFOL 10 MG/ML IV BOLUS
INTRAVENOUS | Status: AC
  Filled 2022-07-12: qty 20

## 2022-07-12 MED ORDER — PROPOFOL 10 MG/ML IV BOLUS
INTRAVENOUS | Status: DC | PRN
  Administered 2022-07-12: 150 mg via INTRAVENOUS

## 2022-07-12 MED ORDER — EPINEPHRINE PF 1 MG/ML IJ SOLN
INTRAMUSCULAR | Status: AC
  Filled 2022-07-12: qty 3

## 2022-07-12 MED ORDER — ROCURONIUM BROMIDE 10 MG/ML (PF) SYRINGE
PREFILLED_SYRINGE | INTRAVENOUS | Status: AC
  Filled 2022-07-12: qty 10

## 2022-07-12 MED ORDER — CHLORHEXIDINE GLUCONATE 0.12 % MT SOLN
15.0000 mL | Freq: Once | OROMUCOSAL | Status: AC
  Administered 2022-07-12: 15 mL via OROMUCOSAL
  Filled 2022-07-12: qty 15

## 2022-07-12 MED ORDER — OXYCODONE-ACETAMINOPHEN 5-325 MG PO TABS: 1.0000 | ORAL_TABLET | ORAL | 0 refills | Status: DC | PRN

## 2022-07-12 MED ORDER — PHENYLEPHRINE 80 MCG/ML (10ML) SYRINGE FOR IV PUSH (FOR BLOOD PRESSURE SUPPORT)
PREFILLED_SYRINGE | INTRAVENOUS | Status: DC | PRN
  Administered 2022-07-12: 80 ug via INTRAVENOUS

## 2022-07-12 MED ORDER — MIDAZOLAM HCL 2 MG/2ML IJ SOLN: 2.0000 mg | Freq: Once | INTRAMUSCULAR | Status: AC

## 2022-07-12 MED ORDER — SODIUM CHLORIDE 0.9 % IR SOLN
Status: DC | PRN
  Administered 2022-07-12 (×2): 3001 mL

## 2022-07-12 MED ORDER — ONDANSETRON HCL 4 MG/2ML IJ SOLN
INTRAMUSCULAR | Status: AC
  Filled 2022-07-12: qty 2

## 2022-07-12 MED ORDER — BUPIVACAINE HCL (PF) 0.25 % IJ SOLN
INTRAMUSCULAR | Status: AC
  Filled 2022-07-12: qty 30

## 2022-07-12 MED ORDER — FENTANYL CITRATE (PF) 100 MCG/2ML IJ SOLN
INTRAMUSCULAR | Status: AC
  Administered 2022-07-12: 50 ug via INTRAVENOUS
  Filled 2022-07-12: qty 2

## 2022-07-12 MED ORDER — FENTANYL CITRATE (PF) 100 MCG/2ML IJ SOLN
INTRAMUSCULAR | Status: DC | PRN
  Administered 2022-07-12: 50 ug via INTRAVENOUS

## 2022-07-12 MED ORDER — ACETAMINOPHEN 500 MG PO TABS
1000.0000 mg | ORAL_TABLET | Freq: Once | ORAL | Status: AC
  Administered 2022-07-12: 1000 mg via ORAL
  Filled 2022-07-12: qty 2

## 2022-07-12 MED ORDER — SODIUM CHLORIDE 0.9 % IR SOLN
Status: DC | PRN
  Administered 2022-07-12: 3000 mL
  Administered 2022-07-12: 1000 mL

## 2022-07-12 MED ORDER — SUGAMMADEX SODIUM 200 MG/2ML IV SOLN
INTRAVENOUS | Status: DC | PRN
  Administered 2022-07-12: 200 mg via INTRAVENOUS

## 2022-07-12 MED ORDER — LIDOCAINE 2% (20 MG/ML) 5 ML SYRINGE
INTRAMUSCULAR | Status: AC
  Filled 2022-07-12: qty 5

## 2022-07-12 MED ORDER — POVIDONE-IODINE 10 % EX SWAB
2.0000 | Freq: Once | CUTANEOUS | Status: AC
  Administered 2022-07-12: 2 via TOPICAL

## 2022-07-12 MED ORDER — DEXAMETHASONE SODIUM PHOSPHATE 10 MG/ML IJ SOLN
INTRAMUSCULAR | Status: AC
  Filled 2022-07-12: qty 1

## 2022-07-12 MED ORDER — POVIDONE-IODINE 7.5 % EX SOLN: Freq: Once | CUTANEOUS | Status: DC

## 2022-07-12 MED ORDER — LIDOCAINE 2% (20 MG/ML) 5 ML SYRINGE
INTRAMUSCULAR | Status: DC | PRN
  Administered 2022-07-12: 20 mg via INTRAVENOUS

## 2022-07-12 MED ORDER — FENTANYL CITRATE (PF) 100 MCG/2ML IJ SOLN: 25.0000 ug | INTRAMUSCULAR | Status: DC | PRN

## 2022-07-12 MED ORDER — CEFAZOLIN SODIUM-DEXTROSE 2-4 GM/100ML-% IV SOLN
2.0000 g | INTRAVENOUS | Status: AC
  Administered 2022-07-12: 2 g via INTRAVENOUS
  Filled 2022-07-12: qty 100

## 2022-07-12 MED ORDER — ORAL CARE MOUTH RINSE: 15.0000 mL | Freq: Once | OROMUCOSAL | Status: AC

## 2022-07-12 MED ORDER — FENTANYL CITRATE (PF) 100 MCG/2ML IJ SOLN: 50.0000 ug | Freq: Once | INTRAMUSCULAR | Status: AC

## 2022-07-12 MED ORDER — FENTANYL CITRATE (PF) 250 MCG/5ML IJ SOLN
INTRAMUSCULAR | Status: AC
  Filled 2022-07-12: qty 5

## 2022-07-12 MED ORDER — MIDAZOLAM HCL 2 MG/2ML IJ SOLN
INTRAMUSCULAR | Status: AC
  Administered 2022-07-12: 2 mg via INTRAVENOUS
  Filled 2022-07-12: qty 2

## 2022-07-12 MED ORDER — BUPIVACAINE LIPOSOME 1.3 % IJ SUSP
INTRAMUSCULAR | Status: DC | PRN
  Administered 2022-07-12: 10 mL via PERINEURAL

## 2022-07-12 MED ORDER — BUPIVACAINE HCL (PF) 0.5 % IJ SOLN
INTRAMUSCULAR | Status: DC | PRN
  Administered 2022-07-12: 15 mL via PERINEURAL

## 2022-07-12 MED ORDER — ONDANSETRON HCL 4 MG/2ML IJ SOLN
INTRAMUSCULAR | Status: DC | PRN
  Administered 2022-07-12: 4 mg via INTRAVENOUS

## 2022-07-12 MED ORDER — SODIUM CHLORIDE (PF) 0.9 % IJ SOLN
INTRAMUSCULAR | Status: DC | PRN
  Administered 2022-07-12: 30 mL

## 2022-07-12 MED ORDER — ROCURONIUM BROMIDE 10 MG/ML (PF) SYRINGE
PREFILLED_SYRINGE | INTRAVENOUS | Status: DC | PRN
  Administered 2022-07-12: 70 mg via INTRAVENOUS

## 2022-07-12 MED ORDER — METHOCARBAMOL 500 MG PO TABS: 500.0000 mg | ORAL_TABLET | Freq: Three times a day (TID) | ORAL | 1 refills | Status: DC | PRN

## 2022-07-12 MED ORDER — DEXAMETHASONE SODIUM PHOSPHATE 10 MG/ML IJ SOLN
INTRAMUSCULAR | Status: DC | PRN
  Administered 2022-07-12: 8 mg via INTRAVENOUS

## 2022-07-12 SURGICAL SUPPLY — 68 items
ANCHOR SUT 1.8 FIBERTAK SB KL (Anchor) IMPLANT
BAG COUNTER SPONGE SURGICOUNT (BAG) ×1 IMPLANT
BLADE EXCALIBUR 4.0X13 (MISCELLANEOUS) ×1 IMPLANT
BLADE SURG 11 STRL SS (BLADE) ×1 IMPLANT
BURR OVAL 8 FLU 4.0X13 (MISCELLANEOUS) IMPLANT
COVER SURGICAL LIGHT HANDLE (MISCELLANEOUS) ×1 IMPLANT
DRAPE INCISE IOBAN 66X45 STRL (DRAPES) ×2 IMPLANT
DRAPE STERI 35X30 U-POUCH (DRAPES) ×1 IMPLANT
DRAPE U-SHAPE 47X51 STRL (DRAPES) ×2 IMPLANT
DRSG TEGADERM 4X4.75 (GAUZE/BANDAGES/DRESSINGS) ×4 IMPLANT
DURAPREP 26ML APPLICATOR (WOUND CARE) ×1 IMPLANT
ELECT REM PT RETURN 9FT ADLT (ELECTROSURGICAL) ×1
ELECTRODE REM PT RTRN 9FT ADLT (ELECTROSURGICAL) ×1 IMPLANT
FILTER STRAW FLUID ASPIR (MISCELLANEOUS) ×1 IMPLANT
GAUZE PAD ABD 8X10 STRL (GAUZE/BANDAGES/DRESSINGS) ×3 IMPLANT
GAUZE SPONGE 4X4 12PLY STRL (GAUZE/BANDAGES/DRESSINGS) IMPLANT
GAUZE SPONGE 4X4 12PLY STRL LF (GAUZE/BANDAGES/DRESSINGS) ×1 IMPLANT
GAUZE XEROFORM 1X8 LF (GAUZE/BANDAGES/DRESSINGS) ×1 IMPLANT
GLOVE BIOGEL PI IND STRL 8 (GLOVE) ×1 IMPLANT
GLOVE ECLIPSE 8.0 STRL XLNG CF (GLOVE) ×1 IMPLANT
GOWN STRL REUS W/ TWL LRG LVL3 (GOWN DISPOSABLE) ×3 IMPLANT
GOWN STRL REUS W/TWL LRG LVL3 (GOWN DISPOSABLE) ×2
KIT BASIN OR (CUSTOM PROCEDURE TRAY) ×1 IMPLANT
KIT STR SPEAR 1.8 FBRTK DISP (KITS) IMPLANT
KIT TURNOVER KIT B (KITS) ×1 IMPLANT
MANIFOLD NEPTUNE II (INSTRUMENTS) ×1 IMPLANT
NDL HYPO 25X1 1.5 SAFETY (NEEDLE) ×1 IMPLANT
NDL SCORPION MULTI FIRE (NEEDLE) IMPLANT
NDL SPNL 18GX3.5 QUINCKE PK (NEEDLE) ×1 IMPLANT
NDL SUT 6 .5 CRC .975X.05 MAYO (NEEDLE) IMPLANT
NEEDLE HYPO 25X1 1.5 SAFETY (NEEDLE) ×1 IMPLANT
NEEDLE MAYO TAPER (NEEDLE)
NEEDLE SCORPION MULTI FIRE (NEEDLE) IMPLANT
NEEDLE SPNL 18GX3.5 QUINCKE PK (NEEDLE) ×1 IMPLANT
NS IRRIG 1000ML POUR BTL (IV SOLUTION) ×1 IMPLANT
PACK SHOULDER (CUSTOM PROCEDURE TRAY) ×1 IMPLANT
PAD ARMBOARD 7.5X6 YLW CONV (MISCELLANEOUS) ×2 IMPLANT
PROBE APOLLO 90XL (SURGICAL WAND) IMPLANT
RESTRAINT HEAD UNIVERSAL NS (MISCELLANEOUS) ×1 IMPLANT
SLING ARM IMMOBILIZER MED (SOFTGOODS) IMPLANT
SLING ARM IMMOBILIZER XL (CAST SUPPLIES) IMPLANT
SPONGE T-LAP 18X18 ~~LOC~~+RFID (SPONGE) IMPLANT
SPONGE T-LAP 4X18 ~~LOC~~+RFID (SPONGE) ×2 IMPLANT
STRIP CLOSURE SKIN 1/2X4 (GAUZE/BANDAGES/DRESSINGS) ×1 IMPLANT
SUCTION FRAZIER HANDLE 10FR (MISCELLANEOUS) ×1
SUCTION TUBE FRAZIER 10FR DISP (MISCELLANEOUS) ×1 IMPLANT
SUT ETHILON 3 0 PS 1 (SUTURE) ×1 IMPLANT
SUT FIBERWIRE #2 38 T-5 BLUE (SUTURE)
SUT MNCRL AB 3-0 PS2 18 (SUTURE) ×1 IMPLANT
SUT VIC AB 0 CT1 27 (SUTURE) ×2
SUT VIC AB 0 CT1 27XBRD ANBCTR (SUTURE) ×1 IMPLANT
SUT VIC AB 1 CT1 27 (SUTURE) ×2
SUT VIC AB 1 CT1 27XBRD ANBCTR (SUTURE) ×1 IMPLANT
SUT VIC AB 1 CT1 27XBRD ANTBC (SUTURE) IMPLANT
SUT VIC AB 2-0 CT1 27 (SUTURE) ×1
SUT VIC AB 2-0 CT1 TAPERPNT 27 (SUTURE) ×1 IMPLANT
SUT VICRYL 0 UR6 27IN ABS (SUTURE) IMPLANT
SUTURE FIBERWR #2 38 T-5 BLUE (SUTURE) IMPLANT
SYR 20ML LL LF (SYRINGE) ×2 IMPLANT
SYR 3ML LL SCALE MARK (SYRINGE) ×1 IMPLANT
SYR TB 1ML LUER SLIP (SYRINGE) ×1 IMPLANT
SYS FBRTK BUTTON 2.6 (Anchor) ×2 IMPLANT
SYSTEM FBRTK BUTTON 2.6 (Anchor) IMPLANT
TOWEL GREEN STERILE (TOWEL DISPOSABLE) ×1 IMPLANT
TOWEL GREEN STERILE FF (TOWEL DISPOSABLE) ×1 IMPLANT
TUBING ARTHROSCOPY IRRIG 16FT (MISCELLANEOUS) ×1 IMPLANT
WATER STERILE IRR 1000ML POUR (IV SOLUTION) ×1 IMPLANT
YANKAUER SUCT BULB TIP NO VENT (SUCTIONS) IMPLANT

## 2022-07-12 NOTE — Brief Op Note (Signed)
   07/12/2022  2:09 PM  PATIENT:  Paul [name].  51 y.o. male  PRE-OPERATIVE DIAGNOSIS:  ARTHRITIS RIGHT AC JOINT, TEAR GLENOID LABRUM  POST-OPERATIVE DIAGNOSIS:  ARTHRITIS RIGHT AC JOINT, TEAR GLENOID LABRUM  PROCEDURE:  Procedure(s): RIGHT SHOULDER ARTHROSCOPY, DEBRIDEMENT, BICEPS TENODESIS, DISTAL CLAVICLE EXCISION  SURGEON:  Surgeon(s): August Saucer, Corrie Mckusick, MD  ASSISTANT: Magnant PA  ANESTHESIA:   General  EBL: 20 ml    Total I/O In: 100 [IV Piggyback:100] Out: -   BLOOD ADMINISTERED: none  DRAINS: None  LOCAL MEDICATIONS USED:  none  SPECIMEN:  No Specimen  COUNTS:  YES  TOURNIQUET:  * No tourniquets in log *  DICTATION: .done PLAN OF CARE: Discharge to home after PACU  PATIENT DISPOSITION:  PACU - hemodynamically stable

## 2022-07-12 NOTE — Anesthesia Procedure Notes (Addendum)
Anesthesia Regional Block: Interscalene brachial plexus block   Pre-Anesthetic Checklist: , timeout performed,  Correct Patient, Correct Site, Correct Laterality,  Correct Procedure, Correct Position, site marked,  Risks and benefits discussed,  Pre-op evaluation,  At surgeon's request and post-op pain management  Laterality: Right  Prep: Maximum Sterile Barrier Precautions used, chloraprep       Needles:  Injection technique: Single-shot  Needle Type: Echogenic Stimulator Needle     Needle Length: 5cm  Needle Gauge: 21     Additional Needles:   Procedures:,,,, ultrasound used (permanent image in chart),,    Narrative:  Start time: 07/12/2022 11:30 AM End time: 07/12/2022 11:35 AM Injection made incrementally with aspirations every 5 mL. Anesthesiologist: Elmer Picker, MD

## 2022-07-12 NOTE — Op Note (Unsigned)
NAME: Paul [name], Paul [name]. MEDICAL RECORD NO: [id number] ACCOUNT NO: [id number] DATE OF BIRTH: 1970/12/25 FACILITY: [facility] LOCATION: [facility] PHYSICIAN: Graylin Shiver. 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  PROCEDURE:  Right shoulder arthroscopy with limited debridement of the superior labrum with arthroscopic distal clavicle excision and mini open biceps tenodesis.  SURGEON:  Graylin Shiver. August Saucer, MD  ASSISTANT:  Karenann Cai, PA.  INDICATIONS: The patient is a 51 year old patient with right shoulder pain.  He presents for operative management after explanation of risks and benefits.  DESCRIPTION OF PROCEDURE:  The patient was brought to the operating room where general endotracheal anesthesia was induced.  Preoperative antibiotics administered.  Timeout was called.  The patient was placed in the beach chair position with head in  neutral position.  Right arm was prescrubbed with alcohol and Betadine, allowed to air dry, prepped with DuraPrep solution and draped in a sterile manner.  Ioban used to seal the operative field and cover the axilla.  The patient had excellent range of  motion and no instability with the right shoulder under examination under anesthesia.  Posterior portal was created.  Anterior portal created under direct visualization in line with the distal end of the clavicle.  A diagnostic arthroscopy demonstrated  significant degenerative type 2 SLAP tear, mostly posterior, but also anterior.  Fraying was debrided.  Arthrocare wand utilized to release the biceps tendon and debride the superior labrum from the 11:30 position to the 2 o'clock position.  The rotator  cuff was intact.  Glenohumeral articular surfaces intact.  Anterior inferior and posterior inferior labrum and ligaments  intact.  Next instruments were then placed into the subacromial space.  Bursectomy performed.  Distal clavicle was then excised  approximately 9-10 mm using a burr.  Posterior and superior ligaments were preserved.  Adequate decompression confirmed with arthroscopic evaluation.  Next instruments were removed from the portals.  They were then closed using 3-0 nylon.  Ioban then  used to cover the entire operative field.  Incision made off the anterolateral margin of the acromion about 4 cm.  Skin and subcutaneous tissue sharply divided.  Raphae between the anterior middle deltoid was also then divided, measured distance of 4 cm,  marked with #1 Vicryl suture.  Bursectomy completed, not too much spurring was present anterolateral acromion.  Biceps tendon was then tenodesed into the bicipital groove using Arthrex suture anchors knotless in the proximal and distal aspect of the  groove, oversewn with 0 Vicryl suture.  This was done under appropriate tension.  Next, thorough irrigation was performed.  Deltoid split closed using #1 Vicryl suture.  Incision then closed using 0 Vicryl suture, 2-0 Vicryl suture, and 3-0 Monocryl with  Steri-Strips.  Impervious dressings applied.  Shoulder sling applied.  The patient tolerated the procedure well without immediate complications.  Luke's assistance was required at all times for retraction, opening, closing, mobilization of tissue.  His  assistance was a medical necessity.   SHY D: 07/12/2022 2:12:57 pm T: 07/12/2022 2:31:00 pm  JOB: [id number]/ [id number]

## 2022-07-12 NOTE — Transfer of Care (Signed)
Immediate Anesthesia Transfer of Care Note  Patient: Broadus Costilla.  Procedure(s) Performed: RIGHT SHOULDER ARTHROSCOPY, DEBRIDEMENT, BICEPS TENODESIS, DISTAL CLAVICLE EXCISION (Right: Shoulder)  Patient Location: PACU  Anesthesia Type:General  Level of Consciousness: awake, alert , and oriented  Airway & Oxygen Therapy: Patient Spontanous Breathing and Patient connected to face mask oxygen  Post-op Assessment: Report given to RN and Post -op Vital signs reviewed and stable  Post vital signs: Reviewed and stable  Last Vitals:  Vitals Value Taken Time  BP 126/72   Temp    Pulse 75 07/12/22 1432  Resp 18 07/12/22 1432  SpO2 98 % 07/12/22 1432  Vitals shown include unvalidated device data.  Last Pain:  Vitals:   07/12/22 1145  TempSrc:   PainSc: 0-No pain      Patients Stated Pain Goal: 0 (07/12/22 1145)  Complications: No notable events documented.

## 2022-07-12 NOTE — Anesthesia Procedure Notes (Signed)
Procedure Name: Intubation Date/Time: 07/12/2022 12:18 PM  Performed by: Niel Hummer, CRNAPre-anesthesia Checklist: Patient identified, Emergency Drugs available, Suction available and Patient being monitored Patient Re-evaluated:Patient Re-evaluated prior to induction Oxygen Delivery Method: Circle system utilized Preoxygenation: Pre-oxygenation with 100% oxygen Induction Type: IV induction Ventilation: Mask ventilation without difficulty Laryngoscope Size: Mac and 4 Grade View: Grade I Tube type: Oral Tube size: 7.0 mm Number of attempts: 1 Airway Equipment and Method: Stylet Placement Confirmation: ETT inserted through vocal cords under direct vision, positive ETCO2 and breath sounds checked- equal and bilateral Secured at: 23 cm Tube secured with: Tape Dental Injury: Teeth and Oropharynx as per pre-operative assessment

## 2022-07-12 NOTE — H&P (Signed)
Paul [name]. is an 51 y.o. male.   Chief Complaint: Right shoulder pain HPI:  Paul [name]. is a 51 y.o. male who presents with right shoulder pain.  Has been ongoing for several years.  Has been worse recently over the last 6 months after he has had to wear back brace for a back injury.  His symptoms started when he sustained a traumatic injury to the right shoulder a couple of years ago when he was holding on with his body weight after falling from a ladder.  He is right-hand dominant.  No prior shoulder surgery.  It is painful for him to lay on the left-hand side.  Reports some radiation of the pain to but not below the elbow.  Also has mild to moderate neck pain with activity.  Denies any numbness and tingling in the arm or hand.  It does hurt him to throw a football.  He has tried physical therapy which did not help with the pain but did help with his range of motion.  Any type of twisting motion is painful.  Does take aspirin and Tylenol as needed.  Tumeric has helped but he did have a seizure in July and he stopped all medication at that time..     Past Medical History:  Diagnosis Date   Abdominal pain    Acromioclavicular joint arthritis    Right shoulder   Cholelithiasis    Claustrophobia    History of kidney stones    Insomnia    Seizure ([place]) 2023   Syncope     Past Surgical History:  Procedure Laterality Date   CHOLECYSTECTOMY N/A 11/20/2012   Procedure: LAPAROSCOPIC CHOLECYSTECTOMY converted to open cholecystectomy with  CHOLANGIOGRAM;  Surgeon: Odis Hollingshead, MD;  Location: [facility];  Service: General;  Laterality: N/A;   ERCP N/A 11/22/2012   Procedure: ENDOSCOPIC RETROGRADE CHOLANGIOPANCREATOGRAPHY (ERCP);  Surgeon: Jeryl Columbia, MD;  Location: [facility];  Service: Endoscopy;  Laterality: N/A;   ERCP N/A 04/21/2013   Procedure: ENDOSCOPIC RETROGRADE CHOLANGIOPANCREATOGRAPHY (ERCP);  Surgeon: Jeryl Columbia, MD;  Location: [facility];  Service: Endoscopy;  Laterality: N/A;    ESOPHAGOGASTRODUODENOSCOPY N/A 01/01/2013   Procedure: ESOPHAGOGASTRODUODENOSCOPY (EGD);  Surgeon: Jeryl Columbia, MD;  [facility];  Service: Endoscopy;  Laterality: N/A;   EXTRACORPOREAL SHOCK WAVE LITHOTRIPSY Right 12/31/2021   Procedure: EXTRACORPOREAL SHOCK WAVE LITHOTRIPSY (ESWL);  Surgeon: Festus Aloe, MD;  Location: [facility];  Service: Urology;  Laterality: Right;   HERNIA REPAIR     Childhood./ inguinal   WISDOM TOOTH EXTRACTION      Family History  Problem Relation Age of Onset   Healthy Mother    Lung cancer Father    Social History:  reports that he has never smoked. He has never used smokeless tobacco. He reports current alcohol use of about 1.0 - 2.0 standard drink of alcohol per week. He reports that he does not use drugs.  Allergies:  Allergies  Allergen Reactions   Dilaudid [Hydromorphone Hcl] Anaphylaxis    Appears to tolerate morphine and oxycodone.   Dilaudid [Hydromorphone] Anaphylaxis   Lunesta [Eszopiclone] Other (See Comments)    Bad taste    Medications Prior to Admission  Medication Sig Dispense Refill   dabigatran (PRADAXA) 150 MG CAPS capsule Take 1 capsule by mouth 2 times daily. (Take first dose of Pradaxa (dabigatran) 12 hours AFTER last dose of Eliquis) 60 capsule 6   enoxaparin (LOVENOX) 100 MG/ML injection Inject  1 mL (100 mg total) into the skin every 12 (twelve) hours. Hold Pradaxa 72 hours before surgery and start Lovenox every 12 hours. Hold Lovenox 12 hours before surgery. 6 mL 0   levETIRAcetam (KEPPRA) 500 MG tablet Take 1 tablet twice a day 180 tablet 3   Menthol, Topical Analgesic, (ICY HOT EX) Apply 1 Application topically daily as needed (pain).     zaleplon (SONATA) 10 MG capsule 1 capsule at bedtime as needed     LORazepam (ATIVAN) 0.5 MG tablet 1-2 tabs 30 - 60 min prior to MRI. Do not drive with this medicine. (Patient not taking: Reported on 07/02/2022) 4 tablet 0    No results found for this or  any previous visit (from the past 48 hour(s)). No results found.  Review of Systems  Musculoskeletal:  Positive for arthralgias.  All other systems reviewed and are negative.   Blood pressure 117/84, pulse 61, temperature 98 F (36.7 C), temperature source Oral, resp. rate 15, height 5\' 10"  (1.778 m), weight 97.5 kg, SpO2 99 %. Physical Exam Vitals reviewed.  HENT:     Head: Normocephalic.     Nose: Nose normal.     Mouth/Throat:     Mouth: Mucous membranes are moist.  Eyes:     Pupils: Pupils are equal, round, and reactive to light.  Cardiovascular:     Rate and Rhythm: Normal rate.     Pulses: Normal pulses.  Pulmonary:     Effort: Pulmonary effort is normal.  Abdominal:     General: Abdomen is flat.  Musculoskeletal:     Cervical back: Normal range of motion.  Skin:    General: Skin is warm.     Capillary Refill: Capillary refill takes less than 2 seconds.  Neurological:     General: No focal deficit present.     Mental Status: He is alert.  Psychiatric:        Mood and Affect: Mood normal.    Ortho exam demonstrates mild to moderate tenderness right AC joint versus left. He has excellent range of motion bilaterally to 75/105/170. Negative apprehension relocation testing on the right. Positive O'Brien's testing and speeds testing on the right. Does have some popping in the right [facility] joint more so than the left. Mild biceps tenderness but most of his pain seems to be posterior superior and is worse with crossarm adduction. Does have a little bit of discomfort with labral load testing   Assessment/Plan  Impression is right shoulder AC joint arthritis and labral tear.  Plan is that the patient does not want any type of cortisone injection.  On the MRI scan which is reviewed he does have a posterior superior labral tear as well as some AC joint arthritis.  Diagnostic lidocaine injection performed into the joint under ultrasound guidance today.  I plan to reach out to him to see  how he did with that diagnostic injection as a determinant of whether or not we should perform distal clavicle excision at the same time as arthroscopy debridement and biceps tenodesis.  I think his symptoms are consistent with superior labral pathology.  Does have some mechanical symptoms.  Operative plan at this time would be arthroscopy with superior labral debridement and biceps tenodesis.  Expected rehab as well as the risk and benefits are discussed with the patient.    Just talk with [facility] and he did get substantial relief from the lidocaine injection into his [facility] joint.  Therefore we will plan for arthroscopy  and biceps tenodesis and distal clavicle excision.  Risk and benefits discussed.  All questions answered.  Anderson Malta, MD 07/12/2022, 11:41 AM

## 2022-07-12 NOTE — Anesthesia Postprocedure Evaluation (Signed)
Anesthesia Post Note  Patient: Paul [name].  Procedure(s) Performed: RIGHT SHOULDER ARTHROSCOPY, DEBRIDEMENT, BICEPS TENODESIS, DISTAL CLAVICLE EXCISION (Right: Shoulder)     Patient location during evaluation: PACU Anesthesia Type: Regional and General Level of consciousness: awake and alert, oriented and patient cooperative Pain management: pain level controlled Vital Signs Assessment: post-procedure vital signs reviewed and stable Respiratory status: spontaneous breathing, nonlabored ventilation and respiratory function stable Cardiovascular status: blood pressure returned to baseline and stable Postop Assessment: no apparent nausea or vomiting Anesthetic complications: no   No notable events documented.  Last Vitals:  Vitals:   07/12/22 1445 07/12/22 1500  BP: 120/80 120/80  Pulse: 62 (!) 58  Resp: 15 16  Temp:  36.4 C  SpO2: 96% 95%    Last Pain:  Vitals:   07/12/22 1500  TempSrc:   PainSc: 0-No pain                 Lannie Fields

## 2022-07-15 ENCOUNTER — Encounter (HOSPITAL_COMMUNITY): Payer: Self-pay | Admitting: Orthopedic Surgery

## 2022-07-16 ENCOUNTER — Other Ambulatory Visit (HOSPITAL_COMMUNITY): Payer: Self-pay

## 2022-07-16 ENCOUNTER — Other Ambulatory Visit (HOSPITAL_BASED_OUTPATIENT_CLINIC_OR_DEPARTMENT_OTHER): Payer: Self-pay

## 2022-07-16 ENCOUNTER — Other Ambulatory Visit: Payer: Self-pay

## 2022-07-16 DIAGNOSIS — S43431A Superior glenoid labrum lesion of right shoulder, initial encounter: Secondary | ICD-10-CM

## 2022-07-16 DIAGNOSIS — M19011 Primary osteoarthritis, right shoulder: Secondary | ICD-10-CM

## 2022-07-17 ENCOUNTER — Other Ambulatory Visit (HOSPITAL_COMMUNITY): Payer: Self-pay

## 2022-07-17 NOTE — Telephone Encounter (Signed)
Prescription written and placed on your desk

## 2022-07-17 NOTE — Telephone Encounter (Signed)
I spoke with patient. Referral and op note placed at front for him to pick up. We discussed CPM that was on his discharge paperwork and I advised ,after speaking with you, that since he was going to see Art, the CPM would not be needed. I did advise that you would get him one if he wanted. I also inquired about the black brace, which Dr. August Saucer had put on the surgery sheet and stated it would be sufficient. Patient did not get that at discharge, and is in blue sling.  Just wanted you to be aware in case something else needed to be done.

## 2022-07-24 ENCOUNTER — Ambulatory Visit (INDEPENDENT_AMBULATORY_CARE_PROVIDER_SITE_OTHER): Payer: 59 | Admitting: Surgical

## 2022-07-24 ENCOUNTER — Encounter: Payer: Self-pay | Admitting: Surgical

## 2022-07-24 DIAGNOSIS — Z9889 Other specified postprocedural states: Secondary | ICD-10-CM

## 2022-07-24 NOTE — Progress Notes (Signed)
Post-Op Visit Note   Patient: Paul [name].           Date of Birth: 1970-10-21           MRN: [id number] Visit Date: 07/24/2022 PCP: Deland Pretty, MD   Assessment & Plan:  Chief Complaint:  Chief Complaint  Patient presents with   Right Shoulder - Routine Post Op   Visit Diagnoses:  1. S/P shoulder surgery     Plan: Paul [name]. is a 52 y.o. male who presents s/p right shoulder arthroscopic distal clavicle excision and biceps tenodesis on 07/12/2022.  Patient is doing well and pain is overall controlled.  Not using CPM machine but is scheduled to start physical therapy on 07/29/2022.  Denies any chest pain, SOB, fevers, chills.   On exam, patient has range of motion 15 degrees X rotation, 50 degrees abduction, 75 degrees forward flexion.  Intact EPL, FPL, finger abduction, finger adduction, pronation/supination, bicep, tricep, deltoid of operative extremity.  Axillary nerve intact with deltoid firing.  Incisions are healing well without evidence of infection or dehiscence.  Sutures removed and replaced with Steri-Strips today.  2+ radial pulse of the operative extremity  Plan is okay for active and passive range of motion of the shoulder.  Avoid shoulder extension.  No lifting more than about the weight of the cell phone with the operative arm.  Okay to discontinue sling today but use it when outside of the house.  Start physical therapy to focus on active and passive range of motion of the shoulder on 07/29/2022.  Follow-up with Dr. Marlou Sa in clinic in 4 weeks for clinical recheck.  At that point anticipate starting some strengthening exercises.   Follow-Up Instructions: No follow-ups on file.   Orders:  No orders of the defined types were placed in this encounter.  No orders of the defined types were placed in this encounter.   Imaging: No results found.  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/26/2022   Confusion 11/28/2020   Partial symptomatic epilepsy with complex partial seizures, not intractable, without status epilepticus ([place]) 11/28/2020   Choledocholithiasis 04/19/2013   CAP (community acquired pneumonia) 04/19/2013   Dehydration 04/19/2013   Cholecystitis, acute s/p open cholecystectomy 11/20/12 11/02/2012   Past Medical History:  Diagnosis Date   Abdominal pain    Acromioclavicular joint arthritis    Right shoulder   Cholelithiasis    Claustrophobia    History of kidney stones    Insomnia    Seizure ([place]) 2023   Syncope     Family History  Problem Relation Age of Onset   Healthy Mother    Lung cancer Father     Past Surgical History:  Procedure Laterality Date   CHOLECYSTECTOMY N/A 11/20/2012   Procedure: LAPAROSCOPIC CHOLECYSTECTOMY converted to open cholecystectomy with  CHOLANGIOGRAM;  Surgeon: Odis Hollingshead, MD;  Location: [facility];  Service: General;  Laterality: N/A;   ERCP N/A 11/22/2012   Procedure: ENDOSCOPIC RETROGRADE CHOLANGIOPANCREATOGRAPHY (ERCP);  Surgeon: Jeryl Columbia, MD;  Location: [facility];  Service: Endoscopy;  Laterality: N/A;   ERCP N/A 04/21/2013   Procedure: ENDOSCOPIC RETROGRADE CHOLANGIOPANCREATOGRAPHY (ERCP);  Surgeon: Jeryl Columbia, MD;  Location: [facility];  Service: Endoscopy;  Laterality: N/A;   ESOPHAGOGASTRODUODENOSCOPY N/A 01/01/2013   Procedure:  ESOPHAGOGASTRODUODENOSCOPY (EGD);  Surgeon: Jeryl Columbia, MD;  Location: [facility] ENDOSCOPY;  Service: Endoscopy;  Laterality: N/A;   EXTRACORPOREAL SHOCK WAVE LITHOTRIPSY Right 12/31/2021   Procedure: EXTRACORPOREAL SHOCK WAVE LITHOTRIPSY (ESWL);  Surgeon: Festus Aloe, MD;  Location: [facility];  Service: Urology;  Laterality: Right;   HERNIA REPAIR     Childhood./ inguinal   SHOULDER ARTHROSCOPY WITH OPEN ROTATOR CUFF  REPAIR AND DISTAL CLAVICLE ACROMINECTOMY Right 07/12/2022   Procedure: RIGHT SHOULDER ARTHROSCOPY, DEBRIDEMENT, BICEPS TENODESIS, DISTAL CLAVICLE EXCISION;  Surgeon: Meredith Pel, MD;  Location: [facility];  Service: Orthopedics;  Laterality: Right;   WISDOM TOOTH EXTRACTION     Social History   Occupational History   Occupation: Press photographer  Tobacco Use   Smoking status: Never   Smokeless tobacco: Never  Vaping Use   Vaping Use: Never used  Substance and Sexual Activity   Alcohol use: Yes    Alcohol/week: 1.0 - 2.0 standard drink of alcohol    Types: 1 - 2 Standard drinks or equivalent per week   Drug use: Never   Sexual activity: Never

## 2022-07-31 ENCOUNTER — Telehealth: Payer: Self-pay | Admitting: Hematology and Oncology

## 2022-07-31 NOTE — Telephone Encounter (Signed)
Called patient to r/s 3/6 appointment due to provider PAL. Patient r/s and notified.  

## 2022-08-03 ENCOUNTER — Other Ambulatory Visit: Payer: Self-pay

## 2022-08-05 ENCOUNTER — Encounter: Payer: Self-pay | Admitting: Pharmacist

## 2022-08-05 ENCOUNTER — Other Ambulatory Visit: Payer: Self-pay

## 2022-08-07 ENCOUNTER — Encounter: Payer: Self-pay | Admitting: Neurology

## 2022-08-07 ENCOUNTER — Encounter (HOSPITAL_COMMUNITY): Payer: Self-pay

## 2022-08-07 ENCOUNTER — Other Ambulatory Visit: Payer: Self-pay

## 2022-08-07 ENCOUNTER — Other Ambulatory Visit (HOSPITAL_COMMUNITY): Payer: Self-pay

## 2022-08-08 ENCOUNTER — Other Ambulatory Visit (HOSPITAL_COMMUNITY): Payer: Self-pay

## 2022-08-08 ENCOUNTER — Other Ambulatory Visit: Payer: Self-pay

## 2022-09-02 ENCOUNTER — Ambulatory Visit (INDEPENDENT_AMBULATORY_CARE_PROVIDER_SITE_OTHER): Payer: BC Managed Care – PPO | Admitting: Orthopedic Surgery

## 2022-09-02 ENCOUNTER — Encounter: Payer: Self-pay | Admitting: Orthopedic Surgery

## 2022-09-02 DIAGNOSIS — Z9889 Other specified postprocedural states: Secondary | ICD-10-CM

## 2022-09-02 NOTE — Progress Notes (Signed)
Post-Op Visit Note   Patient: Paul [name].           Date of Birth: 28-Jun-1971           MRN: [id number] Visit Date: 09/02/2022 PCP: Deland Pretty, MD   Assessment & Plan:  Chief Complaint:  Chief Complaint  Patient presents with   Right Shoulder - Routine Post Op   Visit Diagnoses: No diagnosis found.  Plan: Paul [name] is a patient is now 6 weeks out right shoulder distal clavicle excision and biceps tenodesis.  Overall he is making steady progress with physical therapy.  On examination he has mild pain around the biceps tenodesis site.  Rotator cuff strength is good.  Minimal tenderness around the [facility] joint resection site.  Range of motion is 40/95/150.  Plan at this time is to continue with therapy for resistance initiation and range of motion.  6-week return for final check.  Follow-Up Instructions: No follow-ups on file.   Orders:  No orders of the defined types were placed in this encounter.  No orders of the defined types were placed in this encounter.   Imaging: No results found.  PMFS History: Patient Active Problem List   Diagnosis Date Noted   Arthritis of right acromioclavicular joint 07/16/2022   Degenerative superior labral anterior-to-posterior (SLAP) tear of right shoulder 07/16/2022   Cerebral venous thrombosis of cortical vein 03/26/2022   Confusion 11/28/2020   Partial symptomatic epilepsy with complex partial seizures, not intractable, without status epilepticus ([place]) 11/28/2020   Choledocholithiasis 04/19/2013   CAP (community acquired pneumonia) 04/19/2013   Dehydration 04/19/2013   Cholecystitis, acute s/p open cholecystectomy 11/20/12 11/02/2012   Past Medical History:  Diagnosis Date   Abdominal pain    Acromioclavicular joint arthritis    Right shoulder   Cholelithiasis    Claustrophobia    History of kidney stones    Insomnia    Seizure ([place]) 2023   Syncope     Family History  Problem Relation Age of Onset   Healthy Mother    Lung  cancer Father     Past Surgical History:  Procedure Laterality Date   CHOLECYSTECTOMY N/A 11/20/2012   Procedure: LAPAROSCOPIC CHOLECYSTECTOMY converted to open cholecystectomy with  CHOLANGIOGRAM;  Surgeon: Odis Hollingshead, MD;  Location: [facility];  Service: General;  Laterality: N/A;   ERCP N/A 11/22/2012   Procedure: ENDOSCOPIC RETROGRADE CHOLANGIOPANCREATOGRAPHY (ERCP);  Surgeon: Jeryl Columbia, MD;  Location: [facility];  Service: Endoscopy;  Laterality: N/A;   ERCP N/A 04/21/2013   Procedure: ENDOSCOPIC RETROGRADE CHOLANGIOPANCREATOGRAPHY (ERCP);  Surgeon: Jeryl Columbia, MD;  Location: [facility];  Service: Endoscopy;  Laterality: N/A;   ESOPHAGOGASTRODUODENOSCOPY N/A 01/01/2013   Procedure: ESOPHAGOGASTRODUODENOSCOPY (EGD);  Surgeon: Jeryl Columbia, MD;  Location: [facility] ENDOSCOPY;  Service: Endoscopy;  Laterality: N/A;   EXTRACORPOREAL SHOCK WAVE LITHOTRIPSY Right 12/31/2021   Procedure: EXTRACORPOREAL SHOCK WAVE LITHOTRIPSY (ESWL);  Surgeon: Festus Aloe, MD;  [facility];  Service: Urology;  Laterality: Right;   HERNIA REPAIR     Childhood./ inguinal   SHOULDER ARTHROSCOPY WITH OPEN ROTATOR CUFF REPAIR AND DISTAL CLAVICLE ACROMINECTOMY Right 07/12/2022   Procedure: RIGHT SHOULDER ARTHROSCOPY, DEBRIDEMENT, BICEPS TENODESIS, DISTAL CLAVICLE EXCISION;  Surgeon: Meredith Pel, MD;  Location: [place];  Service: Orthopedics;  Laterality: Right;   WISDOM TOOTH EXTRACTION     Social History   Occupational History   Occupation: Press photographer  Tobacco Use   Smoking status: Never   Smokeless tobacco:  Never  Vaping Use   Vaping Use: Never used  Substance and Sexual Activity   Alcohol use: Yes    Alcohol/week: 1.0 - 2.0 standard drink of alcohol    Types: 1 - 2 Standard drinks or equivalent per week   Drug use: Never   Sexual activity: Never

## 2022-09-11 ENCOUNTER — Other Ambulatory Visit (HOSPITAL_COMMUNITY): Payer: Self-pay

## 2022-09-11 ENCOUNTER — Encounter (HOSPITAL_COMMUNITY): Payer: Self-pay

## 2022-09-13 ENCOUNTER — Other Ambulatory Visit: Payer: Self-pay

## 2022-09-18 ENCOUNTER — Other Ambulatory Visit (HOSPITAL_COMMUNITY): Payer: Self-pay

## 2022-09-25 ENCOUNTER — Other Ambulatory Visit: Payer: BC Managed Care – PPO

## 2022-09-25 ENCOUNTER — Ambulatory Visit: Payer: BC Managed Care – PPO | Admitting: Hematology and Oncology

## 2022-10-07 ENCOUNTER — Other Ambulatory Visit: Payer: Self-pay | Admitting: Hematology and Oncology

## 2022-10-07 DIAGNOSIS — G08 Intracranial and intraspinal phlebitis and thrombophlebitis: Secondary | ICD-10-CM

## 2022-10-07 NOTE — Progress Notes (Signed)
[place] [phone]   [phone]  PROGRESS NOTE  Patient Care Team: Deland Pretty, MD as PCP - General (Internal Medicine) Cameron Sprang, MD as Consulting Physician (Neurology)  Hematological/Oncological History # Cortical Vein Thrombosis 1) 02/02/2022: Presented with nocturnal convulsion 2) 03/04/2022: MRI brain showed abnormal signal within a relatively prominent vein that runs obliquely along the lateral left cerebral hemisphere, consistent with cortical vein thrombosis. 3) 03/07/2022:  -CT venogram was normal.  -Started on Lovenox injection then bridge to Pradaxa 150 mg BID -Hypercoagulable workup: Unremarkable except for mild elevation of homocysteine level 15.9 umol/L, antithrombin III function at 126% 4) 03/15/2022: Switched to Eliquis 5 mg BID as pharmacy did not have Pradaxa on formulary.  5) 03/26/2022: Establish care with [facility] Hematology   Interval History:  Paul [name]. 52 y.o. male with medical history significant for a cortical vein thrombosis who presents for a follow up visit. The patient's last visit was on 03/26/2022. In the interim since the last visit he has continued on Pradaxa therapy.   On exam today Paul [name] ports that he has been tolerating his Pradaxa 150 mg twice daily well without any difficulty.  He notes he is not having any trouble with bleeding, bruising, or dark stools.  He had no issues with headache or vision changes.  He reports that sometimes requiring the medication can be difficult and sometimes he is charged $183 per month.  He reports that he does have a supply of Eliquis which he takes in the gaps when he is having difficulty filling his Pradaxa prescription.  He reports that he has not had any further seizure activity since July 2023.  He reports that overall he feels quite well and is willing and able to proceed with Pradaxa at this time.  He otherwise denies any fevers, chills, sweats, nausea, vomiting or  diarrhea.  A full 10 point ROS was otherwise negative.  MEDICAL HISTORY:  Past Medical History:  Diagnosis Date   Abdominal pain    Acromioclavicular joint arthritis    Right shoulder   Cholelithiasis    Claustrophobia    History of kidney stones    Insomnia    Seizure ([place]) 2023   Syncope     SURGICAL HISTORY: Past Surgical History:  Procedure Laterality Date   CHOLECYSTECTOMY N/A 11/20/2012   Procedure: LAPAROSCOPIC CHOLECYSTECTOMY converted to open cholecystectomy with  CHOLANGIOGRAM;  Surgeon: Odis Hollingshead, MD;  Location: [facility];  Service: General;  Laterality: N/A;   ERCP N/A 11/22/2012   Procedure: ENDOSCOPIC RETROGRADE CHOLANGIOPANCREATOGRAPHY (ERCP);  Surgeon: Jeryl Columbia, MD;  Location: [facility];  Service: Endoscopy;  Laterality: N/A;   ERCP N/A 04/21/2013   Procedure: ENDOSCOPIC RETROGRADE CHOLANGIOPANCREATOGRAPHY (ERCP);  Surgeon: Jeryl Columbia, MD;  Location: [facility];  Service: Endoscopy;  Laterality: N/A;   ESOPHAGOGASTRODUODENOSCOPY N/A 01/01/2013   Procedure: ESOPHAGOGASTRODUODENOSCOPY (EGD);  Surgeon: Jeryl Columbia, MD;  Location: [facility] ENDOSCOPY;  Service: Endoscopy;  Laterality: N/A;   EXTRACORPOREAL SHOCK WAVE LITHOTRIPSY Right 12/31/2021   Procedure: EXTRACORPOREAL SHOCK WAVE LITHOTRIPSY (ESWL);  Surgeon: Festus Aloe, MD;  Location: [facility];  Service: Urology;  Laterality: Right;   HERNIA REPAIR     Childhood./ inguinal   SHOULDER ARTHROSCOPY WITH OPEN ROTATOR CUFF REPAIR AND DISTAL CLAVICLE ACROMINECTOMY Right 07/12/2022   Procedure: RIGHT SHOULDER ARTHROSCOPY, DEBRIDEMENT, BICEPS TENODESIS, DISTAL CLAVICLE EXCISION;  Surgeon: Meredith Pel, MD;  Location: [facility];  Service: Orthopedics;  Laterality: Right;  WISDOM TOOTH EXTRACTION      SOCIAL HISTORY: Social History   Socioeconomic History   Marital status: Married    Spouse name: Not on file   Number of children: 2   Years of education: college   Highest education level:  Not on file  Occupational History   Occupation: sales  Tobacco Use   Smoking status: Never   Smokeless tobacco: Never  Vaping Use   Vaping Use: Never used  Substance and Sexual Activity   Alcohol use: Yes    Alcohol/week: 1.0 - 2.0 standard drink of alcohol    Types: 1 - 2 Standard drinks or equivalent per week   Drug use: Never   Sexual activity: Never  Other Topics Concern   Not on file  Social History Narrative   Lives at home with family.two story home   Right-handed.   Caffeine use: 2 cups per day.   Social Determinants of Health   Financial Resource Strain: Not on file  Food Insecurity: Not on file  Transportation Needs: Not on file  Physical Activity: Not on file  Stress: Not on file  Social Connections: Not on file  Intimate Partner Violence: Not on file    FAMILY HISTORY: Family History  Problem Relation Age of Onset   Healthy Mother    Lung cancer Father     ALLERGIES:  is allergic to dilaudid [hydromorphone hcl], dilaudid [hydromorphone], and lunesta [eszopiclone].  MEDICATIONS:  Current Outpatient Medications  Medication Sig Dispense Refill   dabigatran (PRADAXA) 150 MG CAPS capsule Take 1 capsule (150 mg total) by mouth 2 (two) times daily. 180 capsule 1   levETIRAcetam (KEPPRA) 500 MG tablet Take 1 tablet twice a day 180 tablet 3   Menthol, Topical Analgesic, (ICY HOT EX) Apply 1 Application topically daily as needed (pain).     zaleplon (SONATA) 10 MG capsule 1 capsule at bedtime as needed     methocarbamol (ROBAXIN) 500 MG tablet Take 1 tablet (500 mg total) by mouth every 8 (eight) hours as needed for muscle spasms. 30 tablet 1   oxyCODONE-acetaminophen (PERCOCET) 5-325 MG tablet Take 1 tablet by mouth every 4 (four) hours as needed for severe pain. 30 tablet 0   No current facility-administered medications for this visit.    REVIEW OF SYSTEMS:   Constitutional: ( - ) fevers, ( - )  chills , ( - ) night sweats Eyes: ( - ) blurriness of vision,  ( - ) double vision, ( - ) watery eyes Ears, nose, mouth, throat, and face: ( - ) mucositis, ( - ) sore throat Respiratory: ( - ) cough, ( - ) dyspnea, ( - ) wheezes Cardiovascular: ( - ) palpitation, ( - ) chest discomfort, ( - ) lower extremity swelling Gastrointestinal:  ( - ) nausea, ( - ) heartburn, ( - ) change in bowel habits Skin: ( - ) abnormal skin rashes Lymphatics: ( - ) new lymphadenopathy, ( - ) easy bruising Neurological: ( - ) numbness, ( - ) tingling, ( - ) new weaknesses Behavioral/Psych: ( - ) mood change, ( - ) new changes  All other systems were reviewed with the patient and are negative.  PHYSICAL EXAMINATION:  There were no vitals filed for this visit. There were no vitals filed for this visit.  GENERAL: Well-appearing middle-aged Caucasian male, alert, no distress and comfortable SKIN: skin color, texture, turgor are normal, no rashes or significant lesions EYES: conjunctiva are pink and non-injected, sclera clear LUNGS: clear  to auscultation and percussion with normal breathing effort HEART: regular rate & rhythm and no murmurs and no lower extremity edema Musculoskeletal: no cyanosis of digits and no clubbing  PSYCH: alert & oriented x 3, fluent speech NEURO: no focal motor/sensory deficits  LABORATORY DATA:  I have reviewed the data as listed    Latest Ref Rng & Units 10/08/2022   10:11 AM 07/05/2022    3:00 PM 03/07/2022    4:31 PM  CBC  WBC 4.0 - 10.5 K/uL 6.6  7.6  6.7   Hemoglobin 13.0 - 17.0 g/dL 14.7  14.1  14.3   Hematocrit 39.0 - 52.0 % 41.4  39.8  41.9   Platelets 150 - 400 K/uL 193  206  178        Latest Ref Rng & Units 10/08/2022   10:11 AM 07/05/2022    3:00 PM 03/07/2022    4:31 PM  CMP  Glucose 70 - 99 mg/dL 101  102  88   BUN 6 - 20 mg/dL 17  12  13    Creatinine 0.61 - 1.24 mg/dL 1.12  1.10  1.22   Sodium 135 - 145 mmol/L 141  141  138   Potassium 3.5 - 5.1 mmol/L 4.3  4.1  3.7   Chloride 98 - 111 mmol/L 106  107  106   CO2 22  - 32 mmol/L 27  26  21    Calcium 8.9 - 10.3 mg/dL 10.0  9.5  9.9   Total Protein 6.5 - 8.1 g/dL 7.5   7.5   Total Bilirubin 0.3 - 1.2 mg/dL 0.6   1.0   Alkaline Phos 38 - 126 U/L 52   50   AST 15 - 41 U/L 20   33   ALT 0 - 44 U/L 31   46     RADIOGRAPHIC STUDIES: No results found.  ASSESSMENT & PLAN Paul [name]. Is a 52 y.o. male diagnosed with cortical vein thrombosis. Previously we reviewed possible etiologies for his CVTs including pregnancy, oral contraceptives, malignancy, inflammatory conditions, and clotting disorders.  Patient underwent hypercoagulable work-up on 03/07/2022 that ruled out antiphospholipid syndrome, protein C and S deficiencies, factor V Leiden mutation and prothrombin gene mutation. There was mild elevation of Antithrombin III function measuring 126% and homocysteine level measuring 15.9 umol/L.  These levels are not elevated enough to meet criteria for a clotting disorder.   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    # Cortical Vein Thrombosis --continue Pradaxa 150 mg twice daily.  --We recommend duration of anticoagulation therapy is up to 12 months in unprovoked cases.   --today the patient reports no signs or symptoms of persistent/recurrent VTE.  --labs today show white blood cell 6.6, hemoglobin 14.7, MCV 89.8, and platelets of 193 --Recommend for patient to return in approximately 6 months with labs (August 2024, the 1 year mark).      No orders of the defined types were placed in this encounter.   All questions were answered. The patient knows to call the clinic with any problems, questions or concerns.  A total of more than 30 minutes were spent on this encounter with face-to-face time and non-face-to-face time, including preparing to see the patient, ordering tests and/or medications, counseling the patient and coordination of care as outlined above.   Paul Peoples, MD Department of  Hematology/Oncology [place] [facility] Phone: [phone] Pager: [phone]  Email: [email or web address]  10/08/2022 3:58 PM

## 2022-10-08 ENCOUNTER — Inpatient Hospital Stay (HOSPITAL_BASED_OUTPATIENT_CLINIC_OR_DEPARTMENT_OTHER): Payer: 59 | Admitting: Hematology and Oncology

## 2022-10-08 ENCOUNTER — Inpatient Hospital Stay: Payer: 59 | Attending: Hematology and Oncology

## 2022-10-08 ENCOUNTER — Other Ambulatory Visit: Payer: Self-pay

## 2022-10-08 DIAGNOSIS — G08 Intracranial and intraspinal phlebitis and thrombophlebitis: Secondary | ICD-10-CM | POA: Diagnosis not present

## 2022-10-08 DIAGNOSIS — Z7901 Long term (current) use of anticoagulants: Secondary | ICD-10-CM | POA: Diagnosis not present

## 2022-10-08 DIAGNOSIS — Z801 Family history of malignant neoplasm of trachea, bronchus and lung: Secondary | ICD-10-CM | POA: Diagnosis not present

## 2022-10-08 LAB — CBC WITH DIFFERENTIAL (CANCER CENTER ONLY)
Abs Immature Granulocytes: 0.01 10*3/uL (ref 0.00–0.07)
Basophils Absolute: 0 10*3/uL (ref 0.0–0.1)
Basophils Relative: 1 %
Eosinophils Absolute: 0.1 10*3/uL (ref 0.0–0.5)
Eosinophils Relative: 1 %
HCT: 41.4 % (ref 39.0–52.0)
Hemoglobin: 14.7 g/dL (ref 13.0–17.0)
Immature Granulocytes: 0 %
Lymphocytes Relative: 29 %
Lymphs Abs: 1.9 10*3/uL (ref 0.7–4.0)
MCH: 31.9 pg (ref 26.0–34.0)
MCHC: 35.5 g/dL (ref 30.0–36.0)
MCV: 89.8 fL (ref 80.0–100.0)
Monocytes Absolute: 0.6 10*3/uL (ref 0.1–1.0)
Monocytes Relative: 9 %
Neutro Abs: 4 10*3/uL (ref 1.7–7.7)
Neutrophils Relative %: 60 %
Platelet Count: 193 10*3/uL (ref 150–400)
RBC: 4.61 MIL/uL (ref 4.22–5.81)
RDW: 13.1 % (ref 11.5–15.5)
WBC Count: 6.6 10*3/uL (ref 4.0–10.5)
nRBC: 0 % (ref 0.0–0.2)

## 2022-10-08 LAB — CMP (CANCER CENTER ONLY)
ALT: 31 U/L (ref 0–44)
AST: 20 U/L (ref 15–41)
Albumin: 5.2 g/dL — ABNORMAL HIGH (ref 3.5–5.0)
Alkaline Phosphatase: 52 U/L (ref 38–126)
Anion gap: 8 (ref 5–15)
BUN: 17 mg/dL (ref 6–20)
CO2: 27 mmol/L (ref 22–32)
Calcium: 10 mg/dL (ref 8.9–10.3)
Chloride: 106 mmol/L (ref 98–111)
Creatinine: 1.12 mg/dL (ref 0.61–1.24)
GFR, Estimated: >60 mL/min (ref >60)
Glucose, Bld: 101 mg/dL — ABNORMAL HIGH (ref 70–99)
Potassium: 4.3 mmol/L (ref 3.5–5.1)
Sodium: 141 mmol/L (ref 135–145)
Total Bilirubin: 0.6 mg/dL (ref 0.3–1.2)
Total Protein: 7.5 g/dL (ref 6.5–8.1)

## 2022-10-08 MED ORDER — DABIGATRAN ETEXILATE MESYLATE 150 MG PO CAPS: 150.0000 mg | ORAL_CAPSULE | Freq: Two times a day (BID) | ORAL | 1 refills | Status: DC

## 2022-10-30 ENCOUNTER — Ambulatory Visit (INDEPENDENT_AMBULATORY_CARE_PROVIDER_SITE_OTHER): Payer: 59 | Admitting: Orthopedic Surgery

## 2022-10-30 DIAGNOSIS — Z9889 Other specified postprocedural states: Secondary | ICD-10-CM

## 2022-11-03 ENCOUNTER — Encounter: Payer: Self-pay | Admitting: Orthopedic Surgery

## 2022-11-03 NOTE — Progress Notes (Signed)
Post-Op Visit Note   Patient: Paul [name].           Date of Birth: April 01, 1971           MRN: [id number] Visit Date: 10/30/2022 PCP: Merri Brunette, MD   Assessment & Plan:  Chief Complaint:  Chief Complaint  Patient presents with   Right Shoulder - Routine Post Op    07/12/22 (86m 19d)Right Shoulder Arthroscopy, Debridement, Biceps Tenodesis, Distal Clavicle Excision      Visit Diagnoses: No diagnosis found.  Plan: Elazar is a patient underwent right shoulder arthroscopy with biceps tenodesis and distal clavicle excision on 07/10/2022.  He is doing okay.  Still having some occasional positional pain.  Describes improved range of motion.  Doing band strength training.  Not throwing yet.  On examination he is got range of motion of 70/100/170.  Biceps tendon appears to be under good tension.  No discrete AC joint tenderness.  Not too much mechanical symptoms or popping with passive range of motion.  Plan anticipated return to throwing in about 6 weeks.  Continue with strengthening exercises but avoid overhead resistive exercises as well as close hand type exercises which could stress the AC joint.  Follow-up as needed.  Follow-Up Instructions: No follow-ups on file.   Orders:  No orders of the defined types were placed in this encounter.  No orders of the defined types were placed in this encounter.   Imaging: No results found.  PMFS History: Patient Active Problem List   Diagnosis Date Noted   Arthritis of right acromioclavicular joint 07/16/2022   Degenerative superior labral anterior-to-posterior (SLAP) tear of right shoulder 07/16/2022   Cerebral venous thrombosis of cortical vein 03/26/2022   Confusion 11/28/2020   Partial symptomatic epilepsy with complex partial seizures, not intractable, without status epilepticus 11/28/2020   Choledocholithiasis 04/19/2013   CAP (community acquired pneumonia) 04/19/2013   Dehydration 04/19/2013   Cholecystitis, acute s/p open  cholecystectomy 11/20/12 11/02/2012   Past Medical History:  Diagnosis Date   Abdominal pain    Acromioclavicular joint arthritis    Right shoulder   Cholelithiasis    Claustrophobia    History of kidney stones    Insomnia    Seizure ([facility]) 2023   Syncope     Family History  Problem Relation Age of Onset   Healthy Mother    Lung cancer Father     Past Surgical History:  Procedure Laterality Date   CHOLECYSTECTOMY N/A 11/20/2012   Procedure: LAPAROSCOPIC CHOLECYSTECTOMY converted to open cholecystectomy with  CHOLANGIOGRAM;  Surgeon: Adolph Pollack, MD;  Location: [facility];  Service: General;  Laterality: N/A;   ERCP N/A 11/22/2012   Procedure: ENDOSCOPIC RETROGRADE CHOLANGIOPANCREATOGRAPHY (ERCP);  Surgeon: Petra Kuba, MD;  Location: [facility];  Service: Endoscopy;  Laterality: N/A;   ERCP N/A 04/21/2013   Procedure: ENDOSCOPIC RETROGRADE CHOLANGIOPANCREATOGRAPHY (ERCP);  Surgeon: Petra Kuba, MD;  Location: [facility];  Service: Endoscopy;  Laterality: N/A;   ESOPHAGOGASTRODUODENOSCOPY N/A 01/01/2013   Procedure: ESOPHAGOGASTRODUODENOSCOPY (EGD);  Surgeon: Petra Kuba, MD;  Location: [facility] END[facility]COPY;  Service: Endoscopy;  Laterality: N/A;   EXTRACORPOREAL SHOCK WAVE LITHOTRIPSY Right 12/31/2021   Procedure: EXTRACORPOREAL SHOCK WAVE LITHOTRIPSY (ESWL);  Surgeon: Jerilee Field, MD;  Location: [facility];  Service: Urology;  Laterality: Right;   HERNIA REPAIR     Childhood./ inguinal   SHOULDER ARTHROSCOPY WITH OPEN ROTATOR CUFF REPAIR AND DISTAL CLAVICLE ACROMINECTOMY Right 07/12/2022   Procedure: RIGHT SHOULDER ARTHROSCOPY,  DEBRIDEMENT, BICEPS TENODESIS, DISTAL CLAVICLE EXCISION;  Surgeon: Cammy Copa, MD;  Location: [facility] OR;  Service: Orthopedics;  Laterality: Right;   WISDOM TOOTH EXTRACTION     Social History   Occupational History   Occupation: Airline pilot  Tobacco Use   Smoking status: Never   Smokeless tobacco: Never  Vaping Use   Vaping Use: Never used   Substance and Sexual Activity   Alcohol use: Yes    Alcohol/week: 1.0 - 2.0 standard drink of alcohol    Types: 1 - 2 Standard drinks or equivalent per week   Drug use: Never   Sexual activity: Never

## 2022-12-24 ENCOUNTER — Encounter: Payer: Self-pay | Admitting: Neurology

## 2022-12-24 ENCOUNTER — Ambulatory Visit (INDEPENDENT_AMBULATORY_CARE_PROVIDER_SITE_OTHER): Payer: 59 | Admitting: Neurology

## 2022-12-24 VITALS — BP 122/66 | HR 76 | Ht 70.0 in | Wt 227.6 lb

## 2022-12-24 DIAGNOSIS — R569 Unspecified convulsions: Secondary | ICD-10-CM | POA: Diagnosis not present

## 2022-12-24 MED ORDER — LEVETIRACETAM 500 MG PO TABS: ORAL_TABLET | ORAL | 3 refills | Status: DC

## 2022-12-24 NOTE — Progress Notes (Signed)
NEUROLOGY FOLLOW UP OFFICE NOTE  Paul [name] [id number] [birth year]  HISTORY OF PRESENT ILLNESS: I had the pleasure of seeing Paul [name] in follow-up in the neurology clinic on 12/24/2022.  The patient was last seen 6 months ago for new onset seizures. He had a nocturnal convulsion on 02/02/22. Prior to this, he had an episode of facial twitching and an episode of aphasia in 2022. 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 Follow-up MRV in 05/2022 showed small vein in the region of the left post-central sulcus that may be partially thrombosed, no new changes. He follows with Hematology for likely unprovoked CVT, on Pradaxa for 12 months (1-year mark August 2024). He denies any seizures or seizure-like symptoms since 01/2022. No side effects on Levetiracetam 500mg  BID. He denies any staring/unresponsive episodes, gaps in time, olfactory/gustatory hallucinations, focal numbness/tingling/weakness, myoclonic jerks. No headaches, dizziness, vision changes, no falls. He gets 6-8 hours of sleep. No significant side effects on LEV, irritability has leveled off. He still has some back pain.    History on Initial Assessment 02/07/2022: This is  52 year old right-handed man with a history of ureteral stone, presenting for evaluation of seizures. He was in the ER on 02/02/2022 after a nocturnal seizure witnessed by his wife. He felt fine the night prior and went to bed at 1:30am, then at 5:30am his wife heard his call out and saw his arms moving up and down. She could not wake him up, then after 60 seconds he went slack with sonorous respirations. He woke up to EMS around him, his wife reported he woke up after 10 minutes but was confused and groggy. He recalls being able to answer some questions but not all correctly, no tongue bite, incontinence, or focal weakness. He felt sore in his back and calves. Bloodwork showed a creatinine of 1.27, EtOH and UDS  negative. I personally reviewed head CT without contrast which did not show any acute changes. A CT lumbar spine without contrast showed an acute T12 superior endplate compression fracture with 20% loss of vertebral body height. He was discharged home on Levetiracetam 500mg  BID.   He was evaluated by neurologist Dr. [name] in May 2022 after he had an episode in February 2022 while fixing his daughter's hair, he suddenly felt cloudy, dizzy, and tried to find a place to rest, when he suddenly fell to the floor on his right side and his wife noticed facial twitching. He was out briefly and took 30 minutes to clear up. Around 2 weeks later, he was at work talking on the phone when he felt an abnormal sensation, he was able to understand the conversation but had trouble finding and pronouncing his words for 3 minutes. There was no associated headache or any other symptoms, no loss of consciousness. Dr. [name] ordered an MRI and EEG, and prescribed Lamotrigine, however he did not start it and did not complete the studies. He denies any similar episodes since then. He and his brother-in-law deny any staring/unresponsive episodes, gaps in time, olfactory/gustatory hallucinations, deja vu, rising epigastric sensation, focal numbness/tingling/weakness, myoclonic jerks. Since the seizure, Paul [name] has noticed he is slower to formulate his words, it is not very noticeable, but Paul [name] notes he is more deliberate. He denies any headaches, dizziness, vision changes, dysarthria/dysphagia, neck pain, bowel/bladder dysfunction. He has no prior history of back pain, but since the seizure, his back has been bothering him, he was given Roxicodone in  the ER. He has been referred to [facility] and is awaiting an appointment. He has never been a good sleeper, usually getting 5 hours of sleep, but had no recent change in sleep pattern. He has prn Zaleplon. He drinks 1-2 alcohol beverages a week and had a beer the night prior to the  seizure. Mood is pretty good, he may be a little more irritable. Memory is okay. He works in Airline pilot.    Epilepsy Risk Factors:  He had a normal birth and early development.  There is no history of febrile convulsions, CNS infections such as meningitis/encephalitis, significant traumatic brain injury, neurosurgical procedures, or family history of seizures.  Diagnostic Data: EEG 02/2022: normal wake and drowsy EEG  PAST MEDICAL HISTORY: Past Medical History:  Diagnosis Date   Abdominal pain    Acromioclavicular joint arthritis    Right shoulder   Cholelithiasis    Claustrophobia    History of kidney stones    Insomnia    Seizure ([facility]) 2023   Syncope     MEDICATIONS: Current Outpatient Medications on File Prior to Visit  Medication Sig Dispense Refill   dabigatran (PRADAXA) 150 MG CAPS capsule Take 1 capsule (150 mg total) by mouth 2 (two) times daily. 180 capsule 1   levETIRAcetam (KEPPRA) 500 MG tablet Take 1 tablet twice a day 180 tablet 3   Menthol, Topical Analgesic, (ICY HOT EX) Apply 1 Application topically daily as needed (pain).     methocarbamol (ROBAXIN) 500 MG tablet Take 1 tablet (500 mg total) by mouth every 8 (eight) hours as needed for muscle spasms. 30 tablet 1   oxyCODONE-acetaminophen (PERCOCET) 5-325 MG tablet Take 1 tablet by mouth every 4 (four) hours as needed for severe pain. 30 tablet 0   zaleplon (SONATA) 10 MG capsule 1 capsule at bedtime as needed     No current facility-administered medications on file prior to visit.    ALLERGIES: Allergies  Allergen Reactions   Dilaudid [Hydromorphone Hcl] Anaphylaxis    Appears to tolerate morphine and oxycodone.   Dilaudid [Hydromorphone] Anaphylaxis   Lunesta [Eszopiclone] Other (See Comments)    Bad taste    FAMILY HISTORY: Family History  Problem Relation Age of Onset   Healthy Mother    Lung cancer Father     SOCIAL HISTORY: Social History   Socioeconomic History   Marital status: Married     Spouse name: Not on file   Number of children: 2   Years of education: college   Highest education level: Not on file  Occupational History   Occupation: sales  Tobacco Use   Smoking status: Never   Smokeless tobacco: Never  Vaping Use   Vaping Use: Never used  Substance and Sexual Activity   Alcohol use: Yes    Alcohol/week: 1.0 - 2.0 standard drink of alcohol    Types: 1 - 2 Standard drinks or equivalent per week   Drug use: Never   Sexual activity: Never  Other Topics Concern   Not on file  Social History Narrative   Lives at home with family.two story home   Right-handed.   Caffeine use: 2 cups per day.   Social Determinants of Health   Financial Resource Strain: Not on file  Food Insecurity: Not on file  Transportation Needs: Not on file  Physical Activity: Not on file  Stress: Not on file  Social Connections: Not on file  Intimate Partner Violence: Not on file     PHYSICAL EXAM: Vitals:  12/24/22 1530  BP: 122/66  Pulse: 76  SpO2: 98%   General: No acute distress Head:  Normocephalic/atraumatic Skin/Extremities: No rash, no edema Neurological Exam: alert and awake. No aphasia or dysarthria. Fund of knowledge is appropriate.  Attention and concentration are normal.   Cranial nerves: Pupils equal, round. Extraocular movements intact with no nystagmus. Visual fields full.  No facial asymmetry.  Motor: Bulk and tone normal, muscle strength 5/5 throughout with no pronator drift.   Finger to nose testing intact.  Gait narrow-based and steady, able to tandem walk adequately.  Romberg negative.   IMPRESSION: This is  52 yo RH man with a history of ureteral stone, with new onset seizure on 02/02/2022. He had an episode of loss of consciousness with facial twitching in February 2022 and a transient episode of word-finding difficulties in March 2022. Brain MRI done 02/2022 showed changes consistent with cortical vein thrombosis of likely the left vein of [place],  age-indeterminate but possibly subacute. No edema. EEG normal. He has seen Hematology with diagnosis of likely unprovoked CTV, take Pradaxa until 02/2023. No seizures since 01/2022, continue Levetiracetam 500mg  BID. He is aware of [place] driving laws to stop driving after a seizure until 6 months seizure-free. Follow-up in 6 months, call for any changes.   Thank you for allowing me to participate in his care.  Please do not hesitate to call for any questions or concerns.    Patrcia Dolly, M.D.   CC: Dr. Renne Crigler

## 2022-12-24 NOTE — Patient Instructions (Signed)
Good to see you doing well. Continue Levetiracetam (Keppra) 500mg twice a day. Follow-up in 6 months, call for any changes.    Seizure Precautions: 1. If medication has been prescribed for you to prevent seizures, take it exactly as directed.  Do not stop taking the medicine without talking to your doctor first, even if you have not had a seizure in a long time.   2. Avoid activities in which a seizure would cause danger to yourself or to others.  Don't operate dangerous machinery, swim alone, or climb in high or dangerous places, such as on ladders, roofs, or girders.  Do not drive unless your doctor says you may.  3. If you have any warning that you may have a seizure, lay down in a safe place where you can't hurt yourself.    4.  No driving for 6 months from last seizure, as per [place] state law.   Please refer to the following link on the Epilepsy Foundation of America's website for more information: http://www.epilepsyfoundation.org/answerplace/Social/driving/drivingu.cfm   5.  Maintain good sleep hygiene. Avoid alcohol.  6.  Contact your doctor if you have any problems that may be related to the medicine you are taking.  7.  Call 911 and bring the patient back to the ED if:        A.  The seizure lasts longer than 5 minutes.       B.  The patient doesn't awaken shortly after the seizure  C.  The patient has new problems such as difficulty seeing, speaking or moving  D.  The patient was injured during the seizure  E.  The patient has a temperature over 102 F (39C)  F.  The patient vomited and now is having trouble breathing        

## 2022-12-27 ENCOUNTER — Encounter: Payer: Self-pay | Admitting: Neurology

## 2022-12-30 ENCOUNTER — Ambulatory Visit: Payer: BC Managed Care – PPO | Admitting: Neurology

## 2023-03-10 ENCOUNTER — Other Ambulatory Visit: Payer: BC Managed Care – PPO

## 2023-03-10 ENCOUNTER — Telehealth: Payer: Self-pay | Admitting: Hematology and Oncology

## 2023-03-10 ENCOUNTER — Ambulatory Visit: Payer: BC Managed Care – PPO | Admitting: Hematology and Oncology

## 2023-03-18 ENCOUNTER — Other Ambulatory Visit: Payer: Self-pay

## 2023-03-18 ENCOUNTER — Other Ambulatory Visit: Payer: Self-pay | Admitting: Physician Assistant

## 2023-03-18 DIAGNOSIS — G08 Intracranial and intraspinal phlebitis and thrombophlebitis: Secondary | ICD-10-CM

## 2023-03-19 ENCOUNTER — Encounter: Payer: Self-pay | Admitting: Physician Assistant

## 2023-03-19 ENCOUNTER — Inpatient Hospital Stay: Payer: 59 | Attending: Physician Assistant

## 2023-03-19 ENCOUNTER — Inpatient Hospital Stay (HOSPITAL_BASED_OUTPATIENT_CLINIC_OR_DEPARTMENT_OTHER): Payer: 59 | Admitting: Physician Assistant

## 2023-03-19 VITALS — BP 130/89 | HR 50 | Temp 98.3°F | Resp 16 | Wt 220.9 lb

## 2023-03-19 DIAGNOSIS — Z7901 Long term (current) use of anticoagulants: Secondary | ICD-10-CM | POA: Insufficient documentation

## 2023-03-19 DIAGNOSIS — Z801 Family history of malignant neoplasm of trachea, bronchus and lung: Secondary | ICD-10-CM | POA: Insufficient documentation

## 2023-03-19 DIAGNOSIS — Z7902 Long term (current) use of antithrombotics/antiplatelets: Secondary | ICD-10-CM | POA: Insufficient documentation

## 2023-03-19 DIAGNOSIS — G08 Intracranial and intraspinal phlebitis and thrombophlebitis: Secondary | ICD-10-CM

## 2023-03-19 DIAGNOSIS — Z86718 Personal history of other venous thrombosis and embolism: Secondary | ICD-10-CM | POA: Diagnosis present

## 2023-03-19 LAB — CMP (CANCER CENTER ONLY)
ALT: 25 U/L (ref 0–44)
AST: 17 U/L (ref 15–41)
Albumin: 4.9 g/dL (ref 3.5–5.0)
Alkaline Phosphatase: 54 U/L (ref 38–126)
Anion gap: 5 (ref 5–15)
BUN: 17 mg/dL (ref 6–20)
CO2: 29 mmol/L (ref 22–32)
Calcium: 10 mg/dL (ref 8.9–10.3)
Chloride: 107 mmol/L (ref 98–111)
Creatinine: 1.17 mg/dL (ref 0.61–1.24)
GFR, Estimated: >60 mL/min (ref >60)
Glucose, Bld: 109 mg/dL — ABNORMAL HIGH (ref 70–99)
Potassium: 4.8 mmol/L (ref 3.5–5.1)
Sodium: 141 mmol/L (ref 135–145)
Total Bilirubin: 0.5 mg/dL (ref 0.3–1.2)
Total Protein: 7.3 g/dL (ref 6.5–8.1)

## 2023-03-19 LAB — CBC WITH DIFFERENTIAL (CANCER CENTER ONLY)
Abs Immature Granulocytes: 0.01 10*3/uL (ref 0.00–0.07)
Basophils Absolute: 0.1 10*3/uL (ref 0.0–0.1)
Basophils Relative: 1 %
Eosinophils Absolute: 0.1 10*3/uL (ref 0.0–0.5)
Eosinophils Relative: 2 %
HCT: 40.4 % (ref 39.0–52.0)
Hemoglobin: 14.3 g/dL (ref 13.0–17.0)
Immature Granulocytes: 0 %
Lymphocytes Relative: 25 %
Lymphs Abs: 1.9 10*3/uL (ref 0.7–4.0)
MCH: 32.2 pg (ref 26.0–34.0)
MCHC: 35.4 g/dL (ref 30.0–36.0)
MCV: 91 fL (ref 80.0–100.0)
Monocytes Absolute: 0.6 10*3/uL (ref 0.1–1.0)
Monocytes Relative: 8 %
Neutro Abs: 4.9 10*3/uL (ref 1.7–7.7)
Neutrophils Relative %: 64 %
Platelet Count: 200 10*3/uL (ref 150–400)
RBC: 4.44 MIL/uL (ref 4.22–5.81)
RDW: 12.8 % (ref 11.5–15.5)
WBC Count: 7.5 10*3/uL (ref 4.0–10.5)
nRBC: 0 % (ref 0.0–0.2)

## 2023-03-19 NOTE — Progress Notes (Signed)
[facility] [phone]   [phone]  PROGRESS NOTE  Patient Care Team: Paul Brunette, MD as PCP - General (Internal Medicine) Paul Clines, MD as Consulting Physician (Neurology)  Hematological/Oncological History # Cortical Vein Thrombosis 1) 02/02/2022: Presented with nocturnal convulsion 2) 03/04/2022: MRI brain showed abnormal signal within a relatively prominent vein that runs obliquely along the lateral left cerebral hemisphere, consistent with cortical vein thrombosis. 3) 03/07/2022:  -CT venogram was normal.  -Started on Lovenox injection then bridge to Pradaxa 150 mg BID -Hypercoagulable workup: Unremarkable except for mild elevation of homocysteine level 15.9 umol/L, antithrombin III function at 126% 4) 03/15/2022: Switched to Eliquis 5 mg BID as pharmacy did not have Pradaxa on formulary.  5) 03/26/2022: Establish care with [facility] 6) 02/2023: Completed 12 months of Pradaxa therapy.    Interval History:  Paul [name]. 51 y.o. male with medical history significant for a cortical vein thrombosis who presents for a follow up visit. The patient's last visit was on 10/08/2022. In the interim since the last visit he has continued on Pradaxa therapy.   On exam today Paul [name] reports that he has been tolerating his Pradaxa 150 mg twice daily well without any difficulty. He denies any bruising or bleeding. He denies any signs of thromboembolism including headaches, vision changes, swelling in legs, shortness of breath or chest pain.  He otherwise denies any fevers, chills, sweats, nausea, vomiting or diarrhea.  A full 10 point ROS was otherwise negative.  MEDICAL HISTORY:  Past Medical History:  Diagnosis Date   Abdominal pain    Acromioclavicular joint arthritis    Right shoulder   Cholelithiasis    Claustrophobia    History of kidney stones    Insomnia    Seizure (HCC) 2023   Syncope     SURGICAL HISTORY: Past Surgical  History:  Procedure Laterality Date   CHOLECYSTECTOMY N/A 11/20/2012   Procedure: LAPAROSCOPIC CHOLECYSTECTOMY converted to open cholecystectomy with  CHOLANGIOGRAM;  Surgeon: Adolph Pollack, MD;  Location: [facility];  Service: General;  Laterality: N/A;   ERCP N/A 11/22/2012   Procedure: ENDOSCOPIC RETROGRADE CHOLANGIOPANCREATOGRAPHY (ERCP);  Surgeon: Petra Kuba, MD;  Location: [facility];  Service: Endoscopy;  Laterality: N/A;   ERCP N/A 04/21/2013   Procedure: ENDOSCOPIC RETROGRADE CHOLANGIOPANCREATOGRAPHY (ERCP);  Surgeon: Petra Kuba, MD;  Location: [facility];  Service: Endoscopy;  Laterality: N/A;   ESOPHAGOGASTRODUODENOSCOPY N/A 01/01/2013   Procedure: ESOPHAGOGASTRODUODENOSCOPY (EGD);  Surgeon: Petra Kuba, MD;  Location: [facility];  Service: Endoscopy;  Laterality: N/A;   EXTRACORPOREAL SHOCK WAVE LITHOTRIPSY Right 12/31/2021   Procedure: EXTRACORPOREAL SHOCK WAVE LITHOTRIPSY (ESWL);  Surgeon: Jerilee Field, MD;  Location: [facility];  Service: Urology;  Laterality: Right;   HERNIA REPAIR     Childhood./ inguinal   SHOULDER ARTHROSCOPY WITH OPEN ROTATOR CUFF REPAIR AND DISTAL CLAVICLE ACROMINECTOMY Right 07/12/2022   Procedure: RIGHT SHOULDER ARTHROSCOPY, DEBRIDEMENT, BICEPS TENODESIS, DISTAL CLAVICLE EXCISION;  Surgeon: Cammy Copa, MD;  Location: [facility];  Service: Orthopedics;  Laterality: Right;   WISDOM TOOTH EXTRACTION      SOCIAL HISTORY: Social History   Socioeconomic History   Marital status: Married    Spouse name: Not on file   Number of children: 2   Years of education: college   Highest education level: Not on file  Occupational History   Occupation: sales  Tobacco Use   Smoking status: Never   Smokeless tobacco: Never  Vaping Use  Vaping status: Never Used  Substance and Sexual Activity   Alcohol use: Yes    Alcohol/week: 1.0 - 2.0 standard drink of alcohol    Types: 1 - 2 Standard drinks or equivalent per week   Drug use: Never    Sexual activity: Never  Other Topics Concern   Not on file  Social History Narrative   Lives at home with family.two story home   Right-handed.   Caffeine use: 2 cups per day.   Social Determinants of Health   Financial Resource Strain: Not on file  Food Insecurity: Not on file  Transportation Needs: Not on file  Physical Activity: Not on file  Stress: Not on file  Social Connections: Unknown (12/04/2021)   Received from [facility]   Social Network    Social Network: Not on file  Intimate Partner Violence: Unknown (10/26/2021)   Received from [facility]   HITS    Physically Hurt: Not on file    Insult or Talk Down To: Not on file    Threaten Physical Harm: Not on file    Scream or Curse: Not on file    FAMILY HISTORY: Family History  Problem Relation Age of Onset   Healthy Mother    Lung cancer Father     ALLERGIES:  is allergic to dilaudid [hydromorphone hcl], dilaudid [hydromorphone], and lunesta [eszopiclone].  MEDICATIONS:  Current Outpatient Medications  Medication Sig Dispense Refill   dabigatran (PRADAXA) 150 MG CAPS capsule Take 1 capsule (150 mg total) by mouth 2 (two) times daily. 180 capsule 1   levETIRAcetam (KEPPRA) 500 MG tablet Take 1 tablet twice a day 180 tablet 3   Menthol, Topical Analgesic, (ICY HOT EX) Apply 1 Application topically daily as needed (pain).     zaleplon (SONATA) 10 MG capsule 1 capsule at bedtime as needed     No current facility-administered medications for this visit.    REVIEW OF SYSTEMS:   Constitutional: ( - ) fevers, ( - )  chills , ( - ) night sweats Eyes: ( - ) blurriness of vision, ( - ) double vision, ( - ) watery eyes Ears, nose, mouth, throat, and face: ( - ) mucositis, ( - ) sore throat Respiratory: ( - ) cough, ( - ) dyspnea, ( - ) wheezes Cardiovascular: ( - ) palpitation, ( - ) chest discomfort, ( - ) lower extremity swelling Gastrointestinal:  ( - ) nausea, ( - ) heartburn, ( - ) change in bowel  habits Skin: ( - ) abnormal skin rashes Lymphatics: ( - ) new lymphadenopathy, ( - ) easy bruising Neurological: ( - ) numbness, ( - ) tingling, ( - ) new weaknesses Behavioral/Psych: ( - ) mood change, ( - ) new changes  All other systems were reviewed with the patient and are negative.  PHYSICAL EXAMINATION:  Vitals:   03/19/23 1009  BP: 130/89  Pulse: (!) 50  Resp: 16  Temp: 98.3 F (36.8 C)  SpO2: 99%   Filed Weights   03/19/23 1009  Weight: 220 lb 14.4 oz (100.2 kg)    GENERAL: Well-appearing middle-aged Caucasian male, alert, no distress and comfortable SKIN: skin color, texture, turgor are normal, no rashes or significant lesions EYES: conjunctiva are pink and non-injected, sclera clear LUNGS: clear to auscultation and percussion with normal breathing effort HEART: regular rate & rhythm and no murmurs and no lower extremity edema Musculoskeletal: no cyanosis of digits and no clubbing  PSYCH: alert & oriented x 3, fluent  speech NEURO: no focal motor/sensory deficits  LABORATORY DATA:  I have reviewed the data as listed    Latest Ref Rng & Units 03/19/2023    9:51 AM 10/08/2022   10:11 AM 07/05/2022    3:00 PM  CBC  WBC 4.0 - 10.5 K/uL 7.5  6.6  7.6   Hemoglobin 13.0 - 17.0 g/dL 29.5  62.1  30.8   Hematocrit 39.0 - 52.0 % 40.4  41.4  39.8   Platelets 150 - 400 K/uL 200  193  206        Latest Ref Rng & Units 03/19/2023    9:51 AM 10/08/2022   10:11 AM 07/05/2022    3:00 PM  CMP  Glucose 70 - 99 mg/dL 657  846  962   BUN 6 - 20 mg/dL 17  17  12    Creatinine 0.61 - 1.24 mg/dL 9.52  8.41  3.24   Sodium 135 - 145 mmol/L 141  141  141   Potassium 3.5 - 5.1 mmol/L 4.8  4.3  4.1   Chloride 98 - 111 mmol/L 107  106  107   CO2 22 - 32 mmol/L 29  27  26    Calcium 8.9 - 10.3 mg/dL 40.1  02.7  9.5   Total Protein 6.5 - 8.1 g/dL 7.3  7.5    Total Bilirubin 0.3 - 1.2 mg/dL 0.5  0.6    Alkaline Phos 38 - 126 U/L 54  52    AST 15 - 41 U/L 17  20    ALT 0 - 44 U/L 25   31      RADIOGRAPHIC STUDIES: No results found.  ASSESSMENT & PLAN Paul [name]. Is a 52 y.o. male diagnosed with cortical vein thrombosis. Previously we reviewed possible etiologies for his CVTs including pregnancy, oral contraceptives, malignancy, inflammatory conditions, and clotting disorders.  Patient underwent hypercoagulable work-up on 03/07/2022 that ruled out antiphospholipid syndrome, protein C and S deficiencies, factor V Leiden mutation and prothrombin gene mutation. There was mild elevation of Antithrombin III function measuring 126% and homocysteine level measuring 15.9 umol/L.  These levels are not elevated enough to meet criteria for a clotting disorder.   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    # Cortical Vein Thrombosis --Currently on Pradaxa 150 mg twice daily.  --today the patient reports no signs or symptoms of persistent/recurrent VTE.  --labs today show white blood cell 7.5, hemoglobin 14.3, MCV 91.0, and platelets of 200 --We recommend duration of anticoagulation therapy is up to 12 months in unprovoked cases.  Patient will have completed 12 months this month. Recommend to discontinue pradaxa therapy. Patient inquired about continuing on ASA 81 mg daily. Dr. Leonides Schanz recommends to continue on ASA 81 mg daily but adds there is no established evidence that aspirin is recommended for long term use for CVT.  --Recommend for patient to return as needed.    No orders of the defined types were placed in this encounter.   All questions were answered. The patient knows to call the clinic with any problems, questions or concerns.  A total of more than 30 minutes were spent on this encounter with face-to-face time and non-face-to-face time, including preparing to see the patient, ordering tests and/or medications, counseling the patient and coordination of care as outlined above.   Georga Kaufmann PA-C Dept of Hematology and  Oncology [facility] Phone: [phone]  03/19/2023 11:15 AM

## 2023-07-29 ENCOUNTER — Encounter: Payer: Self-pay | Admitting: Neurology

## 2023-07-29 ENCOUNTER — Ambulatory Visit (INDEPENDENT_AMBULATORY_CARE_PROVIDER_SITE_OTHER): Payer: 59 | Admitting: Neurology

## 2023-07-29 VITALS — BP 127/80 | HR 67 | Ht 70.0 in | Wt 228.6 lb

## 2023-07-29 DIAGNOSIS — R569 Unspecified convulsions: Secondary | ICD-10-CM

## 2023-07-29 DIAGNOSIS — Z86718 Personal history of other venous thrombosis and embolism: Secondary | ICD-10-CM

## 2023-07-29 MED ORDER — LEVETIRACETAM 500 MG PO TABS: ORAL_TABLET | ORAL | 3 refills | Status: DC

## 2023-07-29 NOTE — Patient Instructions (Signed)
 Good to see you doing well. Take the Keppra  500mg  every night and see if this helps with the confusion. Follow-up in 6 months, call for any changes.    Seizure Precautions: 1. If medication has been prescribed for you to prevent seizures, take it exactly as directed.  Do not stop taking the medicine without talking to your doctor first, even if you have not had a seizure in a long time.   2. Avoid activities in which a seizure would cause danger to yourself or to others.  Don't operate dangerous machinery, swim alone, or climb in high or dangerous places, such as on ladders, roofs, or girders.  Do not drive unless your doctor says you may.  3. If you have any warning that you may have a seizure, lay down in a safe place where you can't hurt yourself.    4.  No driving for 6 months from last seizure, as per [place]  state law.   Please refer to the following link on the Epilepsy Foundation of America's website for more information: http://www.epilepsyfoundation.org/answerplace/Social/driving/drivingu.cfm   5.  Maintain good sleep hygiene. Avoid alcohol.  6.  Contact your doctor if you have any problems that may be related to the medicine you are taking.  7.  Call 911 and bring the patient back to the ED if:        A.  The seizure lasts longer than 5 minutes.       B.  The patient doesn't awaken shortly after the seizure  C.  The patient has new problems such as difficulty seeing, speaking or moving  D.  The patient was injured during the seizure  E.  The patient has a temperature over 102 F (39C)  F.  The patient vomited and now is having trouble breathing

## 2023-07-29 NOTE — Progress Notes (Signed)
 NEUROLOGY FOLLOW UP OFFICE NOTE  Paul [name] [id number] [birth year]  HISTORY OF PRESENT ILLNESS: I had the pleasure of seeing Paul [name] in follow-up in the neurology clinic on 07/29/2023.  The patient was last seen 7 months ago for new onset seizures. He had a nocturnal convulsion on 02/02/22. Prior to this, he had an episode of facial twitching and an episode of aphasia in 2022. 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 Follow-up MRV in 05/2022 showed small vein in the region of the left post-central sulcus that may be partially thrombosed, no new changes. He follows with Hematology for likely unprovoked CVT and has been off Pradaxa  since August 2024.   Since his last visit, he continues to do well seizure-free since 01/2022. He denies any episodes of facial twitching, aphasia, no staring/unresponsive episodes, gaps in time, olfactory/gustatory hallucinations, focal numbness/tingling/weakness, myoclonic jerks. He denies any headaches, dizziness, vision changes, no falls. He reports self-reducing Levetiracetam  to 500mg  once a day a couple of months ago because he noticed some cognitive issues, getting confused with theoretical concepts, math, forgetting what he went to get. No change with dose reduction. Mood is okay, he notes it has gotten better. Some nights he gets 4 hours, other times he gets 8 hours of sleep.    History on Initial Assessment 02/07/2022: This is  53 year old right-handed man with a history of ureteral stone, presenting for evaluation of seizures. He was in the ER on 02/02/2022 after a nocturnal seizure witnessed by his wife. He felt fine the night prior and went to bed at 1:30am, then at 5:30am his wife heard his call out and saw his arms moving up and down. She could not wake him up, then after 60 seconds he went slack with sonorous respirations. He woke up to EMS around him, his wife reported he woke up after 10  minutes but was confused and groggy. He recalls being able to answer some questions but not all correctly, no tongue bite, incontinence, or focal weakness. He felt sore in his back and calves. Bloodwork showed a creatinine of 1.27, EtOH and UDS negative. I personally reviewed head CT without contrast which did not show any acute changes. A CT lumbar spine without contrast showed an acute T12 superior endplate compression fracture with 20% loss of vertebral body height. He was discharged home on Levetiracetam  500mg  BID.   He was evaluated by neurologist Dr. Onita in May 2022 after he had an episode in February 2022 while fixing his daughter's hair, he suddenly felt cloudy, dizzy, and tried to find a place to rest, when he suddenly fell to the floor on his right side and his wife noticed facial twitching. He was out briefly and took 30 minutes to clear up. Around 2 weeks later, he was at work talking on the phone when he felt an abnormal sensation, he was able to understand the conversation but had trouble finding and pronouncing his words for 3 minutes. There was no associated headache or any other symptoms, no loss of consciousness. Dr. Onita ordered an MRI and EEG, and prescribed Lamotrigine , however he did not start it and did not complete the studies. He denies any similar episodes since then. He and his brother-in-law deny any staring/unresponsive episodes, gaps in time, olfactory/gustatory hallucinations, deja vu, rising epigastric sensation, focal numbness/tingling/weakness, myoclonic jerks. Since the seizure, Paul [name] has noticed he is slower to formulate his words, it is not very  noticeable, but Paul [name] notes he is more deliberate. He denies any headaches, dizziness, vision changes, dysarthria/dysphagia, neck pain, bowel/bladder dysfunction. He has no prior history of back pain, but since the seizure, his back has been bothering him, he was given Roxicodone  in the ER. He has been referred to [facility]  and is awaiting an appointment. He has never been a good sleeper, usually getting 5 hours of sleep, but had no recent change in sleep pattern. He has prn Zaleplon. He drinks 1-2 alcohol beverages a week and had a beer the night prior to the seizure. Mood is pretty good, he may be a little more irritable. Memory is okay. He works in airline pilot.    Epilepsy Risk Factors:  He had a normal birth and early development.  There is no history of febrile convulsions, CNS infections such as meningitis/encephalitis, significant traumatic brain injury, neurosurgical procedures, or family history of seizures.  Diagnostic Data: EEG 02/2022: normal wake and drowsy EEG  PAST MEDICAL HISTORY: Past Medical History:  Diagnosis Date   Abdominal pain    Acromioclavicular joint arthritis    Right shoulder   Cholelithiasis    Claustrophobia    History of kidney stones    Insomnia    Seizure (HCC) 2023   Syncope     MEDICATIONS: Current Outpatient Medications on File Prior to Visit  Medication Sig Dispense Refill   levETIRAcetam  (KEPPRA ) 500 MG tablet Take 1 tablet twice a day (Patient taking 1 tablet every morning) 180 tablet 3   Menthol, Topical Analgesic, (ICY HOT EX) Apply 1 Application topically daily as needed (pain).     zaleplon (SONATA) 10 MG capsule 1 capsule at bedtime as needed     No current facility-administered medications on file prior to visit.    ALLERGIES: Allergies  Allergen Reactions   Dilaudid  [Hydromorphone  Hcl] Anaphylaxis    Appears to tolerate morphine  and oxycodone .   Dilaudid  [Hydromorphone ] Anaphylaxis   Lunesta [Eszopiclone] Other (See Comments)    Bad taste    FAMILY HISTORY: Family History  Problem Relation Age of Onset   Healthy Mother    Lung cancer Father     SOCIAL HISTORY: Social History   Socioeconomic History   Marital status: Married    Spouse name: Not on file   Number of children: 2   Years of education: college   Highest education level: Not on  file  Occupational History   Occupation: sales  Tobacco Use   Smoking status: Never   Smokeless tobacco: Never  Vaping Use   Vaping status: Never Used  Substance and Sexual Activity   Alcohol use: Yes    Alcohol/week: 1.0 - 2.0 standard drink of alcohol    Types: 1 - 2 Standard drinks or equivalent per week   Drug use: Never   Sexual activity: Never  Other Topics Concern   Not on file  Social History Narrative   Lives at home with family.two story home   Right-handed.   Caffeine use: 2 cups per day.   Social Drivers of Corporate Investment Banker Strain: Not on file  Food Insecurity: Not on file  Transportation Needs: Not on file  Physical Activity: Not on file  Stress: Not on file  Social Connections: Unknown (12/04/2021)   [facility]   Social Network    Social Network: Not on file  Intimate Partner Violence: Unknown (10/26/2021)   Received from [facility], [facility]   HITS  Physically Hurt: Not on file    Insult or Talk Down To: Not on file    Threaten Physical Harm: Not on file    Scream or Curse: Not on file     PHYSICAL EXAM: Vitals:   07/29/23 1502  BP: 127/80  Pulse: 67  SpO2: 98%   General: No acute distress Head:  Normocephalic/atraumatic Skin/Extremities: No rash, no edema Neurological Exam: alert and awake. No aphasia or dysarthria. Fund of knowledge is appropriate.Attention and concentration are normal.   Cranial nerves: Pupils equal, round. Extraocular movements intact with no nystagmus. Visual fields full.  No facial asymmetry.  Motor: Bulk and tone normal, muscle strength 5/5 throughout with no pronator drift.   Finger to nose testing intact.  Gait narrow-based and steady, able to tandem walk adequately.  Romberg negative.   IMPRESSION: This is 53 yo RH man with a history of ureteral stone, with new onset seizure on 02/02/2022. He had an episode of loss of consciousness with facial twitching in February 2022  and a transient episode of word-finding difficulties in March 2022. 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 No edema. EEG normal. He has completed Pradaxa  for 1 year for likely unprovoked CTV, after weighing benefits/risks of taking aspirin, he has opted not to start it. He self-reduced Levetiracetam  due to cognitive issues, he will try taking 1 tablet at night and monitor if this helps with cognitive concerns. He is aware of [place] driving laws to stop driving after a seizure-until 6 months seizure-free. Follow-up in 6 months, call for any changes.    Thank you for allowing me to participate in his care.  Please do not hesitate to call for any questions or concerns.    Darice Shivers, M.D.   CC: Dr. Clarice

## 2023-08-07 ENCOUNTER — Encounter: Payer: Self-pay | Admitting: Neurology

## 2023-08-25 IMAGING — CT CT RENAL STONE PROTOCOL
2 of 4 series · 16 of 46 positions shown, 18 images · non-contrast
Comparison: CT 01/11/2016

CLINICAL DATA: Flank pain, kidney stone suspected



[Series 2: stone full · axial · 0.76mm/px · z∈[-409,+66]mm · 13 of 105 slices shown, 15 images]
[im 5/105  soft-tissue]
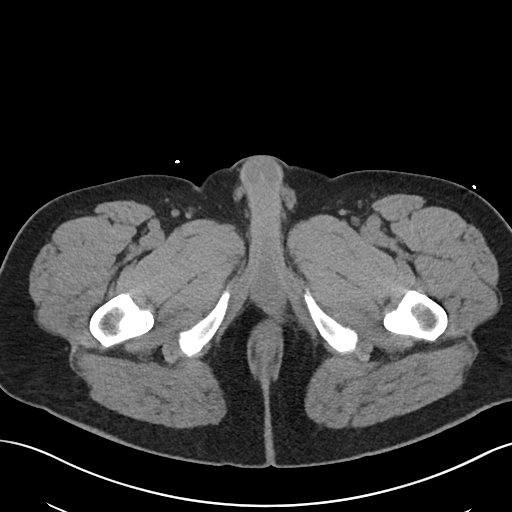
[im 5/105  bone]
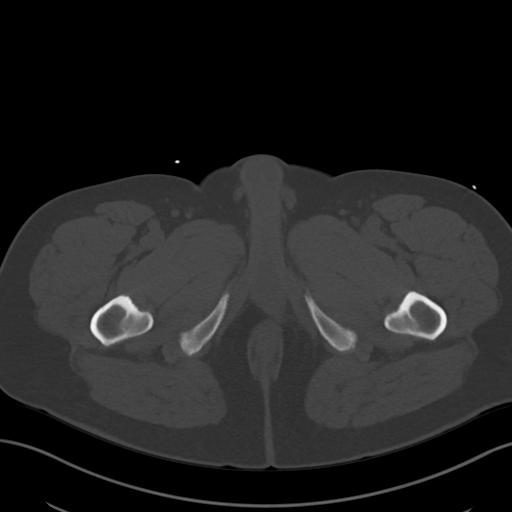
[im 14/105  soft-tissue]
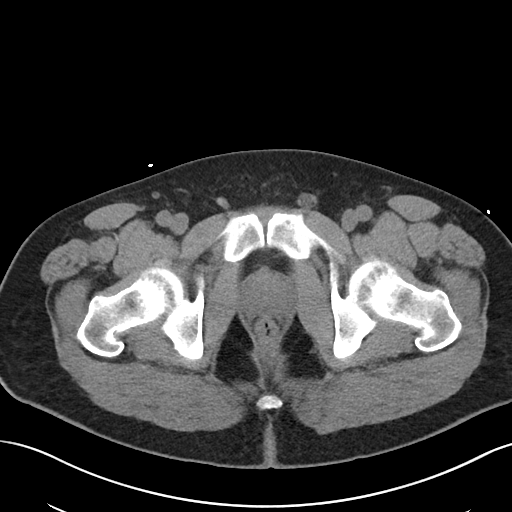
[im 22/105  soft-tissue]
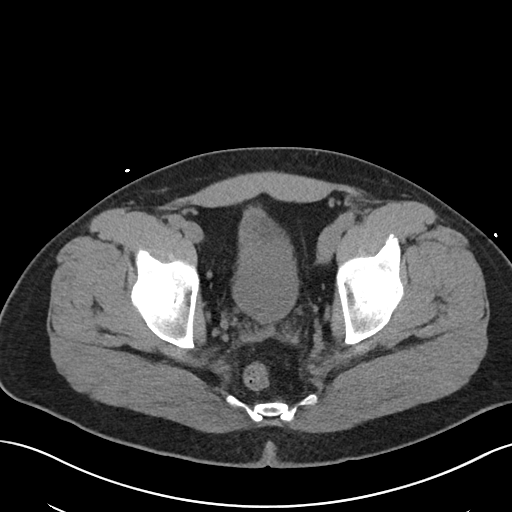
[im 31/105  soft-tissue]
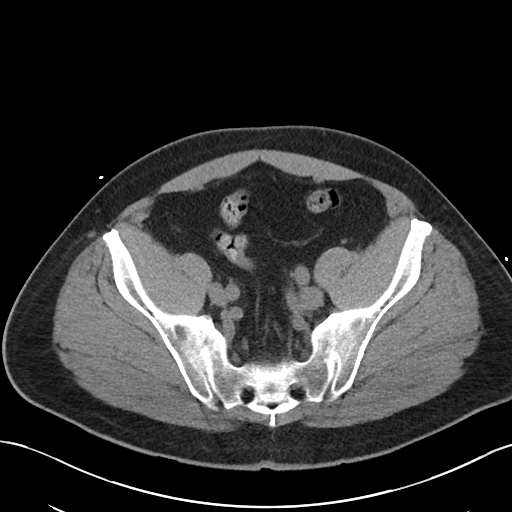
[im 35/105  soft-tissue]
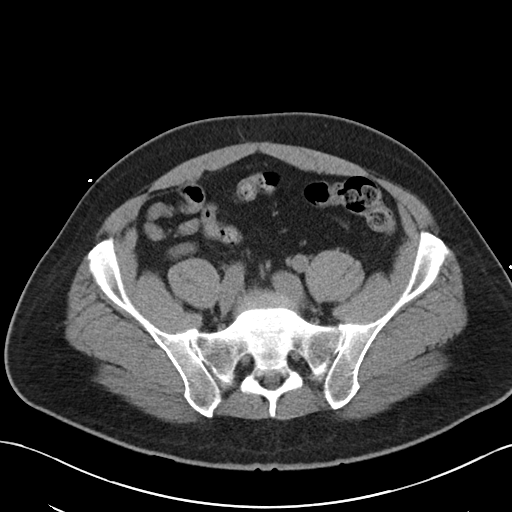
[im 44/105  soft-tissue]
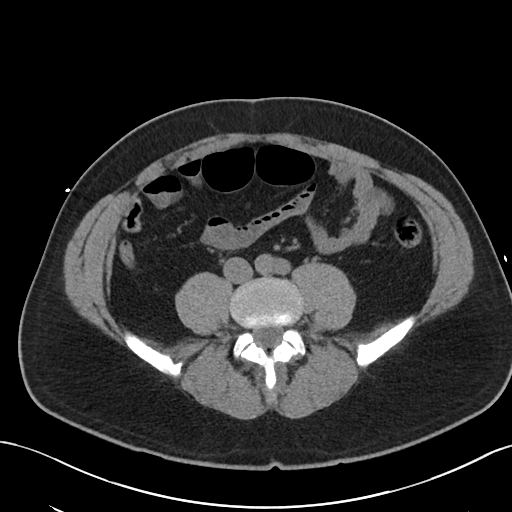
[im 53/105  soft-tissue]
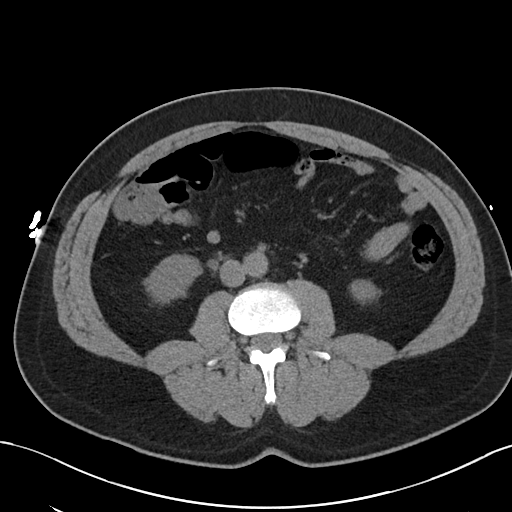
[im 61/105  soft-tissue]
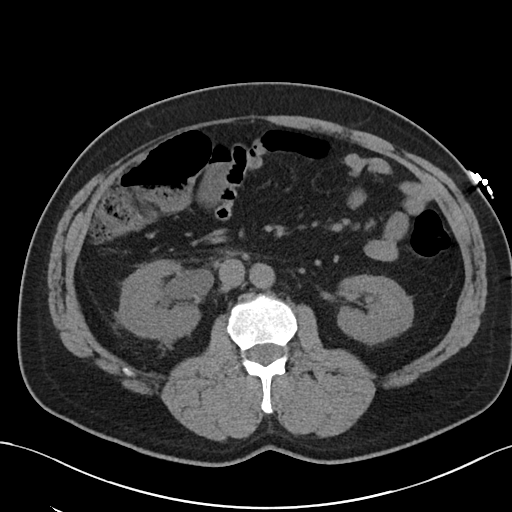
[im 70/105  soft-tissue]
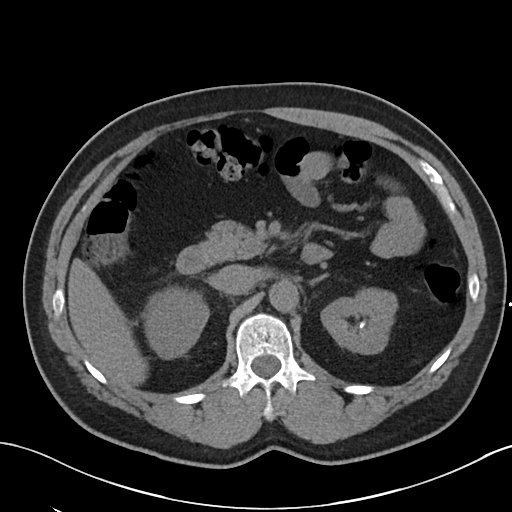
[im 70/105  bone]
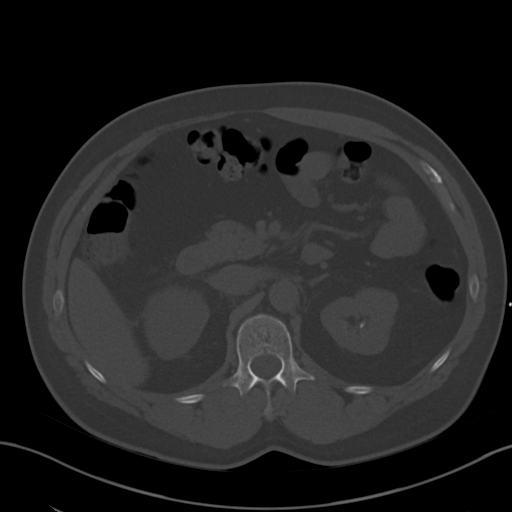
[im 74/105  soft-tissue]
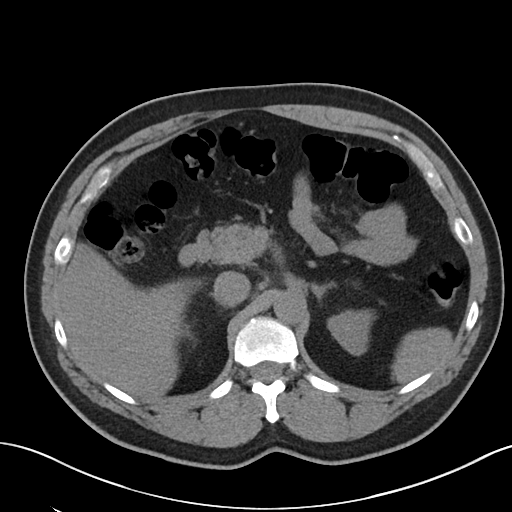
[im 83/105  soft-tissue]
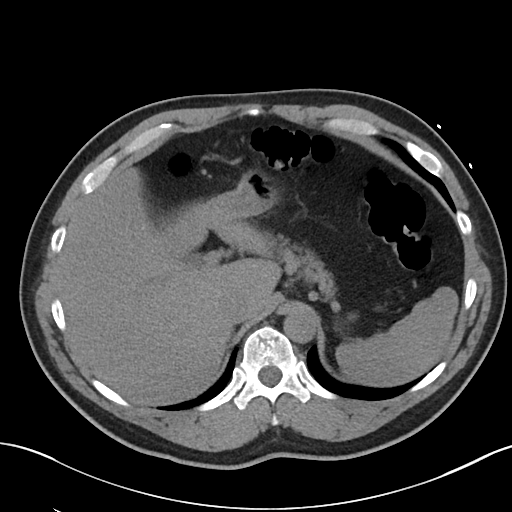
[im 92/105  soft-tissue]
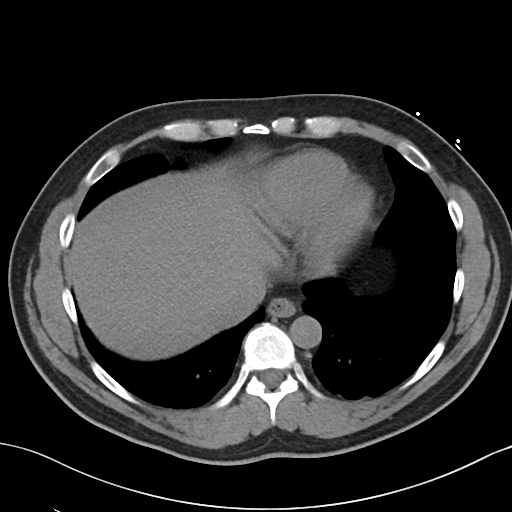
[im 100/105  soft-tissue]
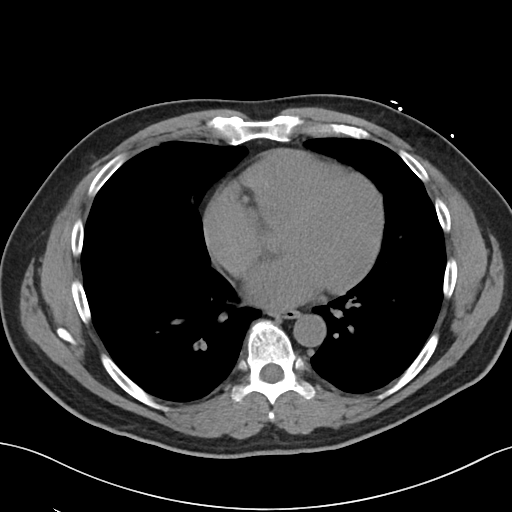

[Series 5: coronal · coronal · 0.75mm/px · 3 of 100 slices shown]
[im 34/100  soft-tissue]
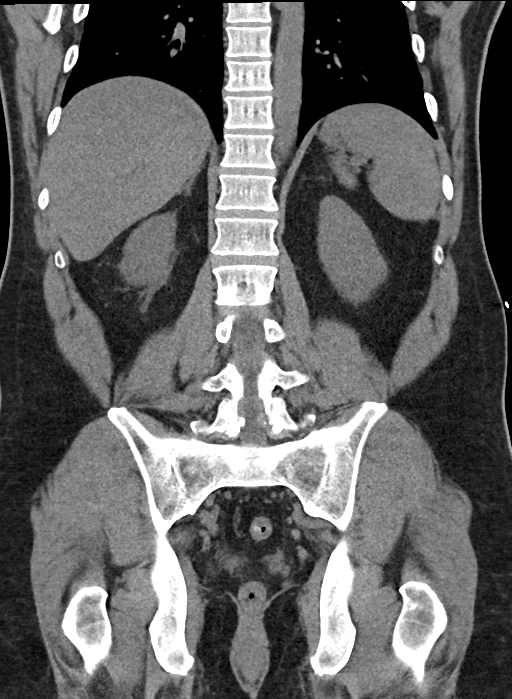
[im 45/100  soft-tissue]
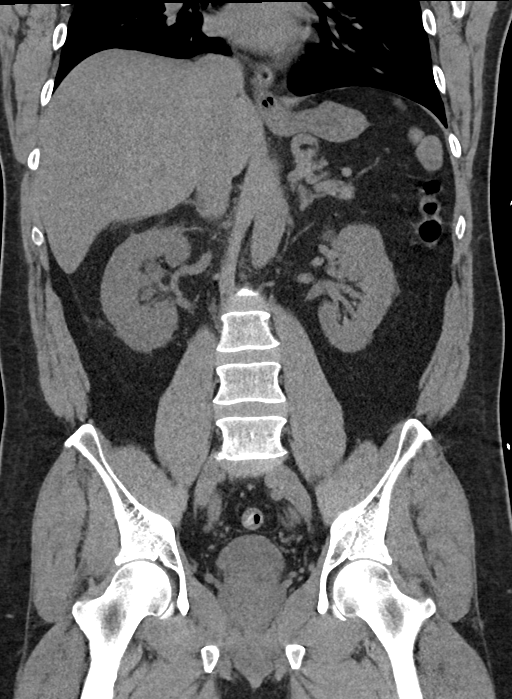
[im 56/100  soft-tissue]
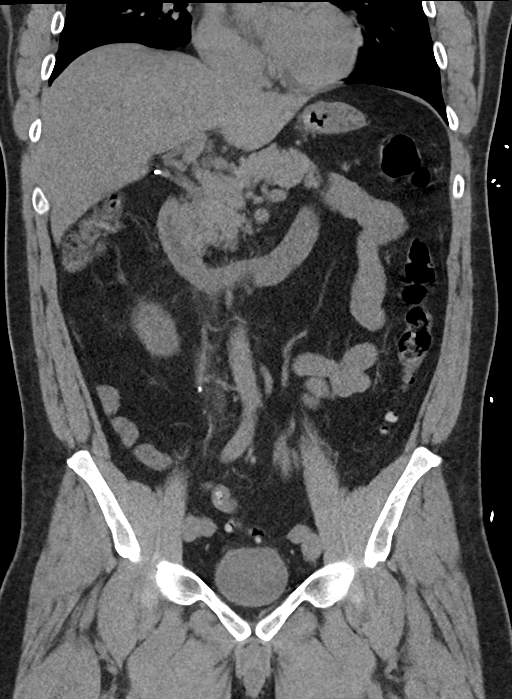

[16 of 46 positions shown; findings below may reference images not displayed]

FINDINGS: Lower chest: Right basilar linear scarring.

Hepatobiliary: No focal liver abnormality is seen. Prior
cholecystectomy.

Pancreas: Unremarkable. No pancreatic ductal dilatation or
surrounding inflammatory changes.

Spleen: Normal in size without focal abnormality.

Adrenals/Urinary Tract: Adrenal glands are unremarkable. There is an
obstructing 4 mm stone in the proximal right ureter, with upstream
hydroureteronephrosis, perinephric and periureteral stranding. There
are nonobstructing 2 and 3 mm stones in the upper pole of the left
kidney. The bladder is mildly distended.

Stomach/Bowel: The stomach is within normal limits. There is no
evidence of bowel obstruction.The appendix is normal. Sigmoid
diverticulosis. No diverticulitis.

Vascular/Lymphatic: No significant vascular findings are present. No
enlarged abdominal or pelvic lymph nodes.

Reproductive: Unremarkable.

Other: No abdominal wall hernia or abnormality. No abdominopelvic
ascites.

Musculoskeletal: No acute osseous abnormality. No suspicious osseous
lesion. Degenerative disc disease at L5-S1 with chronic bilateral L5
pars defects and grade 1 anterolisthesis.
IMPRESSION: Obstructing 4 mm stone in the proximal right ureter with upstream
hydroureteronephrosis, perinephric and periureteral stranding.

Nonobstructive nephrolithiasis in the left kidney.

## 2023-08-30 IMAGING — DX DG ABDOMEN 1V
2 series · 2 of 2 positions shown · non-contrast
Comparison: December 28, 2021

CLINICAL DATA: Pre lithotripsy for right kidney stone.

EXAM:
ABDOMEN - 1 VIEW

[abdomen kub (1 of 2)]
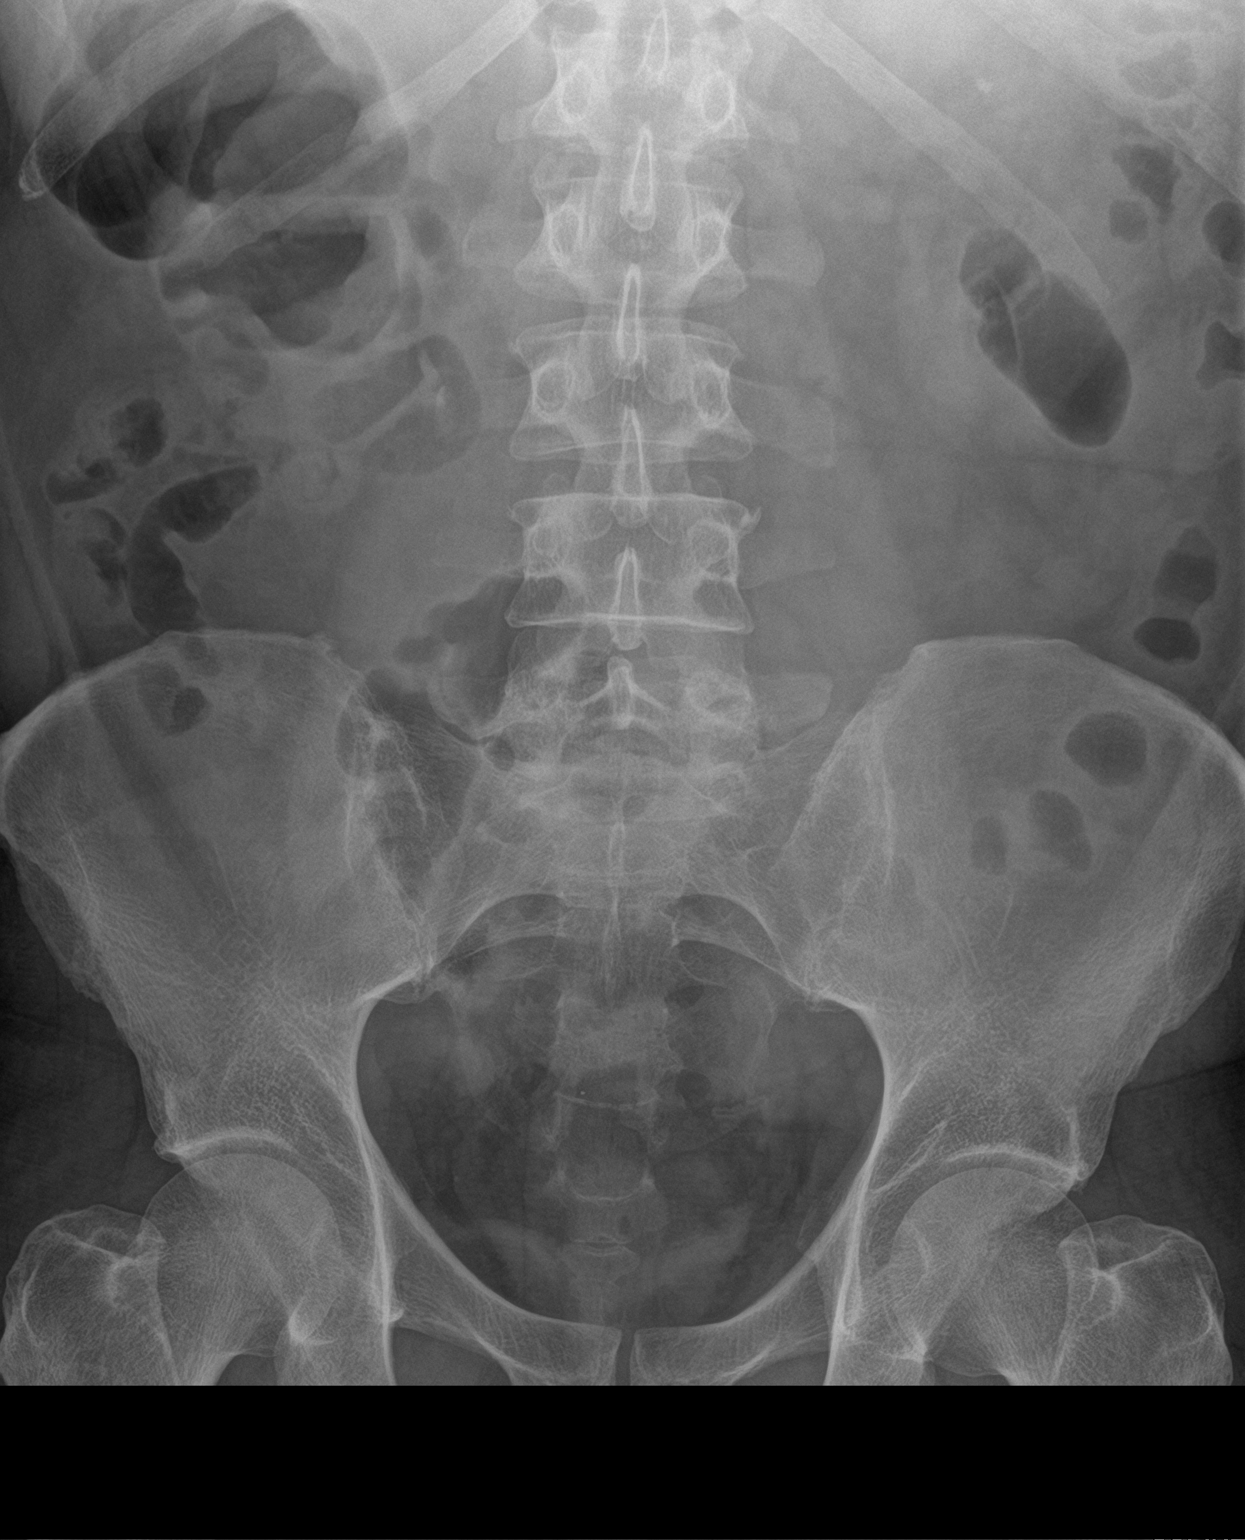

[abdomen kub (2 of 2)]
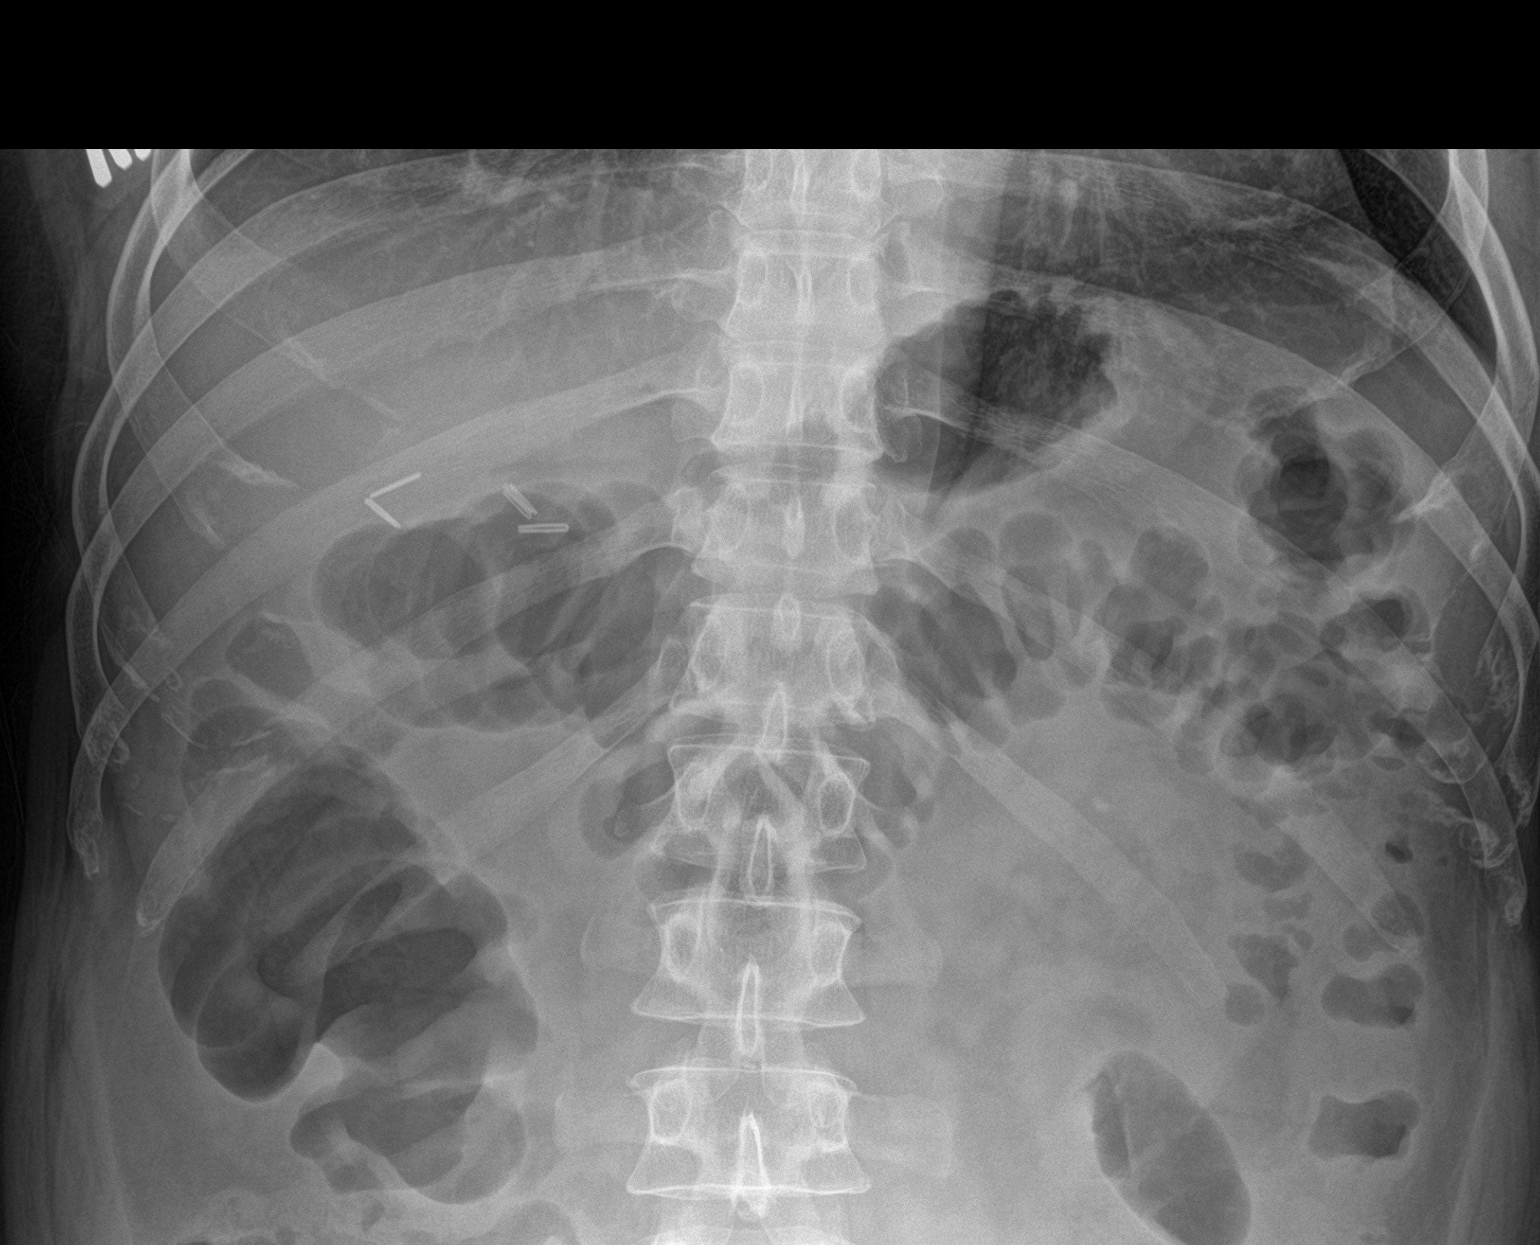

[2 of 2 positions shown; findings below may reference images not displayed]

FINDINGS: The bowel gas pattern is normal. 3 mm upper pole left kidney
nonobstructing stone is identified. No definite right kidney stone
is identified.
IMPRESSION: 3 mm upper pole left kidney nonobstructing stone.

## 2023-12-09 ENCOUNTER — Other Ambulatory Visit (HOSPITAL_COMMUNITY): Payer: Self-pay | Admitting: Internal Medicine

## 2023-12-09 DIAGNOSIS — R109 Unspecified abdominal pain: Secondary | ICD-10-CM

## 2023-12-10 ENCOUNTER — Ambulatory Visit (HOSPITAL_COMMUNITY)
Admission: RE | Admit: 2023-12-10 | Discharge: 2023-12-10 | Disposition: A | Discharge status: Home / Self Care | Source: Ambulatory Visit | Attending: Internal Medicine | Admitting: Internal Medicine

## 2023-12-10 DIAGNOSIS — R109 Unspecified abdominal pain: Secondary | ICD-10-CM | POA: Insufficient documentation

## 2023-12-17 ENCOUNTER — Other Ambulatory Visit: Payer: Self-pay | Admitting: Urology

## 2023-12-17 NOTE — H&P (Signed)
 1 - Recurrent Urolithiasis -  Pre 2025- SWL x 2, MET x several  11/2023 - 7mm left mid ureteral stone with mild hydro. Only additional punctate left renal (too small to measure) at L5 TP level. KUB with stone visible (noted with arrow).   2 - Medical Stone Disease-  BMP, PTH, Urate- pending; Composition - 100% CaOX; 24 Hr Urines - pending   PMH sig for seizures (very well controlled), lap to open chole. No CV disease / blood thinners. Works in Pension scheme manager. His PCP is Imelda Man MD.   Today " Paul [name] " is seen as on-call work in for KUB and eval ureteral stone noted on CT few days ago by Dr. Schuyler Custard.     ALLERGIES: Dilaudid     MEDICATIONS: Percocet  Flomax      GU PSH: ESWL - 12/31/2021     NON-GU PSH: Cholecystectomy (laparoscopic), 2014 Hernia Repair, As a child (1970s)     GU PMH: Renal and ureteral calculus - 01/14/2022      PMH Notes:  1898-07-22 00:00:00 - Note: Normal Routine History And Physical Adult   NON-GU PMH: No Non-GU PMH    FAMILY HISTORY: 2 daughters - Other throat cancer - Father    Notes: 3 foster daughters   SOCIAL HISTORY: Marital Status: Married Preferred Language: English; Ethnicity: Not Hispanic Or Latino; Race: White Current Smoking Status: Patient has never smoked.   Tobacco Use Assessment Completed: Used Tobacco in last 30 days? Does drink.  Drinks 4+ caffeinated drinks per day. Patient's occupation Teacher, music.    REVIEW OF SYSTEMS:    GU Review Male:   Patient denies frequent urination, hard to postpone urination, burning/ pain with urination, get up at night to urinate, leakage of urine, stream starts and stops, trouble starting your stream, have to strain to urinate , erection problems, and penile pain.  Gastrointestinal (Upper):   Patient denies nausea, vomiting, and indigestion/ heartburn.  Gastrointestinal (Lower):   Patient denies constipation and diarrhea.  Constitutional:   Patient denies fever, night sweats, weight loss,  and fatigue.  Skin:   Patient denies skin rash/ lesion and itching.  Eyes:   Patient denies blurred vision and double vision.  Ears/ Nose/ Throat:   Patient denies sore throat and sinus problems.  Hematologic/Lymphatic:   Patient denies swollen glands and easy bruising.  Cardiovascular:   Patient denies leg swelling and chest pains.  Respiratory:   Patient denies cough and shortness of breath.  Endocrine:   Patient denies excessive thirst.  Musculoskeletal:   Patient denies back pain and joint pain.  Neurological:   Patient denies headaches and dizziness.  Psychologic:   Patient denies depression and anxiety.   VITAL SIGNS: None   GU PHYSICAL EXAMINATION:    Testes: No tenderness, no swelling, no enlargement left testes. No tenderness, no swelling, no enlargement right testes. Normal location left testes. Normal location right testes. No mass, no cyst, no varicocele, no hydrocele left testes. No mass, no cyst, no varicocele, no hydrocele right testes.  Penis: Circumcised, no warts, no cracks. No dorsal Peyronie's plaques, no left corporal Peyronie's plaques, no right corporal Peyronie's plaques, no scarring, no warts. No balanitis, no meatal stenosis.  Sphincter Tone: Normal sphincter. No rectal tenderness. No rectal mass.    MULTI-SYSTEM PHYSICAL EXAMINATION:    Constitutional: Well-nourished. No physical deformities. Normally developed. Good grooming.  Neck: Neck symmetrical, not swollen. Normal tracheal position.  Respiratory: No labored breathing, no use of accessory muscles.   Cardiovascular:  Normal temperature, normal extremity pulses, no swelling, no varicosities.  Lymphatic: No enlargement of neck, axillae, groin.  Skin: No paleness, no jaundice, no cyanosis. No lesion, no ulcer, no rash.  Neurologic / Psychiatric: Oriented to time, oriented to place, oriented to person. No depression, no anxiety, no agitation.  Gastrointestinal: No mass, no tenderness, no rigidity, non obese  abdomen.  Eyes: Normal conjunctivae. Normal eyelids.  Ears, Nose, Mouth, and Throat: Left ear no scars, no lesions, no masses. Right ear no scars, no lesions, no masses. Nose no scars, no lesions, no masses. Normal hearing. Normal lips.  Musculoskeletal: Normal gait and station of head and neck.     Complexity of Data:  Records Review:   Pathology Reports, Previous Hospital Records, Previous Patient Records  Urine Test Review:   Urinalysis  X-Ray Review: KUB: Reviewed Films. Discussed With Patient.  C.T. Abdomen/Pelvis: Reviewed Films. Reviewed Report. Discussed With Patient.     PROCEDURES:         KUB - [id number]  A single view of the abdomen is obtained.      Patient confirmed No Neulasta OnPro Device. Left mid ureteral stoen noted just below L5 transverse process.           Visit Complexity - G2211          Urinalysis w/Scope Dipstick Dipstick Cont'd Micro  Color: Yellow Bilirubin: Neg mg/dL WBC/hpf: NS (Not Seen)  Appearance: Clear Ketones: Trace mg/dL RBC/hpf: 0 - 2/hpf  Specific Gravity: 1.025 Blood: Trace ery/uL Bacteria: NS (Not Seen)  pH: <=5.0 Protein: Trace mg/dL Cystals: NS (Not Seen)  Glucose: Neg mg/dL Urobilinogen: 0.2 mg/dL Casts: NS (Not Seen)    Nitrites: Neg Trichomonas: Not Present    Leukocyte Esterase: Neg leu/uL Mucous: Present      Epithelial Cells: NS (Not Seen)      Yeast: NS (Not Seen)      Sperm: Not Present    ASSESSMENT:      ICD-10 Details  1 GU:   Renal and ureteral calculus - N20.2 Left, Chronic, Exacerbation   PLAN:            Medications New Meds: Percocet 5-325 MG Tablet 1 tab Q6 PRN kidney stone pain   #20  0 Refill(s)  Ondansetron  8 MG Tablet Disintegrating 1 tab Q8 PRN nausea from kidney stone   #15  1 Refill(s)  Pharmacy Name:  [facility]  Address:  [address]   [place], [place] [id number]  Phone:  [phone]  Fax:  [phone]            Orders Labs Hypercalciura Profile  X-Rays: KUB           Schedule         Document Letter(s):  Created for Patient: Clinical Summary         Notes:   Discussed management options of medical therapy (<40% chance passage), shockwafe lithotripsy (>90% chance ureteral stone clearance) or left ureteroscopy (also about 90% chance single stage stone free) in order of increasing agressivemess. He opts for left SWL next avail. Percocet and zofran  for interval.   Rec formal metabolic eval on f/u given recurrence pattern as >90% chance future stones now recurrent.   CC: Imelda Man MD, pt's PCP

## 2023-12-18 ENCOUNTER — Encounter (HOSPITAL_COMMUNITY): Payer: Self-pay | Admitting: Urology

## 2023-12-18 NOTE — Progress Notes (Signed)
 Left voicemail instructing to arrive at main entrance of [facility] at 0645, bring driver's license, insurance card, blue folder, do not wear metal from waist down, do not wear flip flops or sandals, may take Keppra  in the morning, if needed, with just a sip of water, other than that, nothing to eat or drink after midnight, take laxative of choice today around lunch time, hydrate well and eat only a light meal this evening. If having any issues tomorrow morning, call [phone], if having any issues between now and tomorrow morning call [facility]. We will try to call back to finish instructions and review health information.

## 2023-12-18 NOTE — Progress Notes (Signed)
 Left voicemail instructing patient not to take NSAIDS, aspirin, pepto bismol today (and until after the procedure).

## 2023-12-18 NOTE — Progress Notes (Signed)
 Spoke w/ via phone for pre-op interview---[facility] needs dos---- KUB              COVID test -----patient states asymptomatic no test needed Arrive at -------0645 NPO after MN NO Solid Food.  Clear liquids from MN until--- Med rec completed. Pt aware to hold ASA/NSAIDs and supplements per [facility] protocol.  Medications to take morning of surgery -----percocet and nausea by 3-4 AM Diabetic/Weight loss medication ----- No Alcohol or recreational drugs for 24 hours/Tobacco products for 6 hours ---- Patient instructed to bring blue lithotripsy folder, photo id and insurance card day of surgery. Patient aware to have Driver (ride ) / caregiver for 24 hours after surgery -----[facility] [phone]  Patient Special Instructions -----take laxative of choice and bring blue folder provided by urology office. Pre-Op special Instructions ----- take laxative of choice day before procedure Patient verbalized understanding of instructions that were given at this phone interview. Patient denies shortness of breath, chest pain, fever, cough at this phone interview.

## 2023-12-19 ENCOUNTER — Other Ambulatory Visit: Payer: Self-pay

## 2023-12-19 ENCOUNTER — Encounter (HOSPITAL_COMMUNITY): Payer: Self-pay | Admitting: Urology

## 2023-12-19 ENCOUNTER — Ambulatory Visit (HOSPITAL_COMMUNITY)

## 2023-12-19 ENCOUNTER — Ambulatory Visit (HOSPITAL_COMMUNITY)
Admission: RE | Admit: 2023-12-19 | Discharge: 2023-12-19 | Disposition: A | Discharge status: Home / Self Care | Attending: Urology | Admitting: Urology

## 2023-12-19 ENCOUNTER — Encounter (HOSPITAL_COMMUNITY)
Admission: RE | Disposition: A | Discharge status: Home / Self Care | Payer: Self-pay | Source: Home / Self Care | Attending: Urology

## 2023-12-19 DIAGNOSIS — N201 Calculus of ureter: Secondary | ICD-10-CM | POA: Insufficient documentation

## 2023-12-19 HISTORY — PX: EXTRACORPOREAL SHOCK WAVE LITHOTRIPSY: SHX1557

## 2023-12-19 SURGERY — LITHOTRIPSY, ESWL
Anesthesia: LOCAL | Laterality: Left

## 2023-12-19 MED ORDER — SODIUM CHLORIDE 0.9 % IV SOLN: INTRAVENOUS | Status: DC

## 2023-12-19 MED ORDER — DIAZEPAM 5 MG PO TABS
10.0000 mg | ORAL_TABLET | ORAL | Status: AC
  Administered 2023-12-19: 10 mg via ORAL
  Filled 2023-12-19: qty 2

## 2023-12-19 MED ORDER — DIPHENHYDRAMINE HCL 25 MG PO CAPS
25.0000 mg | ORAL_CAPSULE | ORAL | Status: AC
  Administered 2023-12-19: 25 mg via ORAL
  Filled 2023-12-19: qty 1

## 2023-12-19 MED ORDER — OXYCODONE-ACETAMINOPHEN 5-325 MG PO TABS: 1.0000 | ORAL_TABLET | Freq: Four times a day (QID) | ORAL | 0 refills | Status: DC | PRN

## 2023-12-19 MED ORDER — TAMSULOSIN HCL 0.4 MG PO CAPS: 0.4000 mg | ORAL_CAPSULE | Freq: Every day | ORAL | 1 refills | Status: DC

## 2023-12-19 MED ORDER — SODIUM CHLORIDE 0.9% FLUSH: 3.0000 mL | Freq: Two times a day (BID) | INTRAVENOUS | Status: DC

## 2023-12-19 MED ORDER — CIPROFLOXACIN HCL 500 MG PO TABS
500.0000 mg | ORAL_TABLET | ORAL | Status: AC
  Administered 2023-12-19: 500 mg via ORAL
  Filled 2023-12-19: qty 1

## 2023-12-19 NOTE — Op Note (Signed)
 See [facility] scanned note

## 2023-12-19 NOTE — Interval H&P Note (Signed)
 History and Physical Interval Note: No change in stone location.  12/19/2023 7:43 AM  Paul [name].  has presented today for surgery, with the diagnosis of LEFT URETERAL STONE.  The various methods of treatment have been discussed with the patient and family. After consideration of risks, benefits and other options for treatment, the patient has consented to  Procedure(s): LITHOTRIPSY, ESWL (Left) as a surgical intervention.  The patient's history has been reviewed, patient examined, no change in status, stable for surgery.  I have reviewed the patient's chart and labs.  Questions were answered to the patient's satisfaction.     [name]

## 2023-12-22 ENCOUNTER — Encounter (HOSPITAL_COMMUNITY): Payer: Self-pay | Admitting: Urology

## 2024-01-08 NOTE — Progress Notes (Unsigned)
   Paul Muck, PhD, LAT, ATC acting as a scribe for Paul Juniper, MD.  Paul [name]. is a 53 y.o. male who presents to [facility] today for LBP ongoing intermittently for about 6 months. Hx T12 compression fx. Pt locates pain to ***  Radiating pain: LE numbness/tingling: LE weakness: Aggravates: Treatments tried:  12/10/23 Ab/Pelvis CT  Pertinent review of systems: ***  Relevant historical information: ***   Exam:  There were no vitals taken for this visit. General: Well Developed, well nourished, and in no acute distress.   MSK: ***    Lab and Radiology Results No results found for this or any previous visit (from the past 72 hours). No results found.     Assessment and Plan: 53 y.o. male with ***   PDMP not reviewed this encounter. No orders of the defined types were placed in this encounter.  No orders of the defined types were placed in this encounter.    Discussed warning signs or symptoms. Please see discharge instructions. Patient expresses understanding.   ***

## 2024-01-12 ENCOUNTER — Ambulatory Visit (INDEPENDENT_AMBULATORY_CARE_PROVIDER_SITE_OTHER): Admitting: Family Medicine

## 2024-01-12 ENCOUNTER — Encounter: Payer: Self-pay | Admitting: Family Medicine

## 2024-01-12 ENCOUNTER — Ambulatory Visit (INDEPENDENT_AMBULATORY_CARE_PROVIDER_SITE_OTHER)

## 2024-01-12 VITALS — BP 114/80 | HR 72 | Ht 70.0 in | Wt 215.0 lb

## 2024-01-12 DIAGNOSIS — M4317 Spondylolisthesis, lumbosacral region: Secondary | ICD-10-CM | POA: Diagnosis not present

## 2024-01-12 DIAGNOSIS — M545 Low back pain, unspecified: Secondary | ICD-10-CM

## 2024-01-12 NOTE — Patient Instructions (Addendum)
 Xray today [place] Spondylolisthesis Exercises

## 2024-01-19 ENCOUNTER — Ambulatory Visit: Payer: Self-pay | Admitting: Family Medicine

## 2024-01-19 NOTE — Progress Notes (Signed)
 Low back x-ray shows a little bit of arthritis

## 2024-02-27 ENCOUNTER — Ambulatory Visit: Payer: 59 | Admitting: Neurology

## 2024-03-02 ENCOUNTER — Encounter: Payer: Self-pay | Admitting: Neurology

## 2024-03-02 ENCOUNTER — Ambulatory Visit (INDEPENDENT_AMBULATORY_CARE_PROVIDER_SITE_OTHER): Payer: 59 | Admitting: Neurology

## 2024-03-02 VITALS — BP 128/82 | HR 69 | Resp 20 | Wt 217.0 lb

## 2024-03-02 DIAGNOSIS — R569 Unspecified convulsions: Secondary | ICD-10-CM | POA: Diagnosis not present

## 2024-03-02 DIAGNOSIS — Z86718 Personal history of other venous thrombosis and embolism: Secondary | ICD-10-CM

## 2024-03-02 MED ORDER — LEVETIRACETAM 500 MG PO TABS: ORAL_TABLET | ORAL | 3 refills | Status: AC

## 2024-03-02 NOTE — Progress Notes (Unsigned)
 NEUROLOGY FOLLOW UP OFFICE NOTE  Paul [name] [id number] Dec 28, 1970  HISTORY OF PRESENT ILLNESS: I had the pleasure of seeing Paul [name]. in follow-up in the neurology clinic on 03/02/2024. He is alone in the office today. The patient was last seen 7 months ago for new onset seizure. He had a nocturnal convulsion on 02/02/22. 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 No edema. EEG normal. Prior to this, he had an episode of facial twitching and an episode of aphasia in 2022. 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 He self-reduced Levetiracetam  to 500mg  at bedtime in 2024 due to cognitive changes. He notes a little improvement taking medication at night, there is not as much confusion. He does add that he misses medication 1-2 times a week with no symptom recurrence. He denies any staring/unresponsive episodes, gaps in time, olfactory/gustatory hallucinations, focal numbness/tingling/weakness, myoclonic jerks. No headaches, dizziness, vision changes, no falls. He gets 5-7 hours of sleep. Mood is good.    History on Initial Assessment 02/07/2022: This is  53 year old right-handed man with a history of ureteral stone, presenting for evaluation of seizures. He was in the ER on 02/02/2022 after a nocturnal seizure witnessed by his wife. He felt fine the night prior and went to bed at 1:30am, then at 5:30am his wife heard his call out and saw his arms moving up and down. She could not wake him up, then after 60 seconds he went slack with sonorous respirations. He woke up to EMS around him, his wife reported he woke up after 10 minutes but was confused and groggy. He recalls being able to answer some questions but not all correctly, no tongue bite, incontinence, or focal weakness. He felt sore in his back and calves. Bloodwork showed a  creatinine of 1.27, EtOH and UDS negative. I personally reviewed head CT without contrast which did not show any acute changes. A CT lumbar spine without contrast showed an acute T12 superior endplate compression fracture with 20% loss of vertebral body height. He was discharged home on Levetiracetam  500mg  BID.   He was evaluated by neurologist Dr. Onita in May 2022 after he had an episode in February 2022 while fixing his daughter's hair, he suddenly felt cloudy, dizzy, and tried to find a place to rest, when he suddenly fell to the floor on his right side and his wife noticed facial twitching. He was out briefly and took 30 minutes to clear up. Around 2 weeks later, he was at work talking on the phone when he felt an abnormal sensation, he was able to understand the conversation but had trouble finding and pronouncing his words for 3 minutes. There was no associated headache or any other symptoms, no loss of consciousness. Dr. Onita ordered an MRI and EEG, and prescribed Lamotrigine , however he did not start it and did not complete the studies. He denies any similar episodes since then. He and his brother-in-law deny any staring/unresponsive episodes, gaps in time, olfactory/gustatory hallucinations, deja vu, rising epigastric sensation, focal numbness/tingling/weakness, myoclonic jerks. Since the seizure, [name] has noticed he is slower to formulate his words, it is not very noticeable, but [name] notes he is more deliberate. He denies any headaches, dizziness, vision changes, dysarthria/dysphagia, neck pain, bowel/bladder dysfunction. He has no prior history of back pain, but since the seizure, his back  has been bothering him, he was given Roxicodone  in the ER. He has been referred to [facility] and is awaiting an appointment. He has never been a good sleeper, usually getting 5 hours of sleep, but had no recent change in sleep pattern. He has prn Zaleplon. He drinks 1-2 alcohol beverages a week and had  a beer the night prior to the seizure. Mood is pretty good, he may be a little more irritable. Memory is okay. He works in Airline pilot.    Epilepsy Risk Factors:  He had a normal birth and early development.  There is no history of febrile convulsions, CNS infections such as meningitis/encephalitis, significant traumatic brain injury, neurosurgical procedures, or family history of seizures.  Diagnostic Data: EEG 02/2022: normal wake and drowsy EEG  MRI brain with and without contrast 02/2022: abnormal signal within a relatively prominent vein that runs obliquely along the lateral left cerebral hemisphere, consistent with cortical vein thrombosis. The vein runs from the region of the left post-central sulcus inferior and anteriorly along the lateral margin of the left sylvian fissure to the anterolateral left middle cranial fossa. This is probably the left vein of Trolard. There is intrinsically T1-hyperintense signal within the vein, consistent with methemoglobin, which would suggest a subacute age.   MRV 05/2022: Small vein in the region of the left postcentral sulcus that may be partially thrombosed.   PAST MEDICAL HISTORY: Past Medical History:  Diagnosis Date   Abdominal pain    Acromioclavicular joint arthritis    Right shoulder   Cholelithiasis    Claustrophobia    History of kidney stones    Insomnia    Seizure (HCC) 2023   Syncope     MEDICATIONS: Current Outpatient Medications on File Prior to Visit  Medication Sig Dispense Refill   levETIRAcetam  (KEPPRA ) 500 MG tablet Take 1 tablet every night 90 tablet 3   zaleplon (SONATA) 10 MG capsule 1 capsule at bedtime as needed     No current facility-administered medications on file prior to visit.    ALLERGIES: Allergies  Allergen Reactions   Dilaudid  [Hydromorphone  Hcl] Anaphylaxis    Appears to tolerate morphine  and oxycodone .   Dilaudid  [Hydromorphone ] Anaphylaxis   Lunesta [Eszopiclone] Other (See Comments)    Bad taste     FAMILY HISTORY: Family History  Problem Relation Age of Onset   Healthy Mother    Lung cancer Father     SOCIAL HISTORY: Social History   Socioeconomic History   Marital status: Married    Spouse name: Not on file   Number of children: 2   Years of education: college   Highest education level: Not on file  Occupational History   Occupation: sales  Tobacco Use   Smoking status: Never   Smokeless tobacco: Never  Vaping Use   Vaping status: Never Used  Substance and Sexual Activity   Alcohol use: Yes    Alcohol/week: 1.0 - 2.0 standard drink of alcohol    Types: 1 - 2 Standard drinks or equivalent per week   Drug use: Never   Sexual activity: Never  Other Topics Concern   Not on file  Social History Narrative   Lives at home with family.two story home   Right-handed.   Caffeine use: 2 cups per day.   Social Drivers of Corporate investment banker Strain: Not on file  Food Insecurity: Not on file  Transportation Needs: Not on file  Physical Activity: Not on file  Stress: Not on file  Social Connections: Unknown (12/04/2021)   [facility] from [facility]   Social Network    Social Network: Not on file  Intimate Partner Violence: Unknown (10/26/2021)   Received from [facility]   HITS    Physically Hurt: Not on file    Insult or Talk Down To: Not on file    Threaten Physical Harm: Not on file    Scream or Curse: Not on file     PHYSICAL EXAM: Vitals:   03/02/24 1414  BP: 128/82  Pulse: 69  Resp: 20  SpO2: 97%   General: No acute distress Head:  Normocephalic/atraumatic Skin/Extremities: No rash, no edema Neurological Exam: alert and awake. No aphasia or dysarthria. Fund of knowledge is appropriate. Attention and concentration are normal.   Cranial nerves: Pupils equal, round. Extraocular movements intact with no nystagmus. Visual fields full.  No facial asymmetry.  Motor: Bulk and tone normal, muscle strength 5/5 throughout with no pronator drift.    Finger to nose testing intact.  Gait narrow-based and steady, able to tandem walk adequately.  Romberg negative.   IMPRESSION: This is 53 yo RH man with a history of ureteral stone, with new onset nocturnal convulsion on 02/02/2022. He had an episode of loss of consciousness with facial twitching in February 2022 and a transient episode of word-finding difficulties in March 2022. 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 No edema. EEG normal. He has completed Pradaxa  treatment a year ago and after weighing benefits/risks of taking aspirin, he has opted not to start it. He has been seizure-free for 2 years and is interested in weaning low dose Keppra , however understandably is concerned about seizure recurrence. I discussed repeating brain MRI with and without contrast and EEG before considering stopping medication. We discussed low risk of long-term side effects with continued use of Keppra , risks of breakthrough seizure with any medication adjustment. We agreed to continue Levetiracetam  500mg  at bedtime for now, he will discuss options with family and let us  know if he would like to proceed with testing. He is aware of [place] driving laws to stop driving after a seizure until 6 months seizure-free. Follow-up in 6 months, call for any changes.   Thank you for allowing me to participate in his care.  Please do not hesitate to call for any questions or concerns.    Darice Shivers, M.D.   CC: Dr. Clarice

## 2024-03-02 NOTE — Patient Instructions (Signed)
 Good to see you.  Continue Keppra  (Levetiracetam ) 500mg : take 1 tablet every night  2. Let me know if you would like to proceed with repeat MRI brain with and without contrast and EEG  3. Follow-up in 6 months, call for any changes   Seizure Precautions: 1. If medication has been prescribed for you to prevent seizures, take it exactly as directed.  Do not stop taking the medicine without talking to your doctor first, even if you have not had a seizure in a long time.   2. Avoid activities in which a seizure would cause danger to yourself or to others.  Don't operate dangerous machinery, swim alone, or climb in high or dangerous places, such as on ladders, roofs, or girders.  Do not drive unless your doctor says you may.  3. If you have any warning that you may have a seizure, lay down in a safe place where you can't hurt yourself.    4.  No driving for 6 months from last seizure, as per [place]  state law.   Please refer to the following link on the Epilepsy Foundation of America's website for more information: http://www.epilepsyfoundation.org/answerplace/Social/driving/drivingu.cfm   5.  Maintain good sleep hygiene. Avoid alcohol.  6.  Contact your doctor if you have any problems that may be related to the medicine you are taking.  7.  Call 911 and bring the patient back to the ED if:        A.  The seizure lasts longer than 5 minutes.       B.  The patient doesn't awaken shortly after the seizure  C.  The patient has new problems such as difficulty seeing, speaking or moving  D.  The patient was injured during the seizure  E.  The patient has a temperature over 102 F (39C)  F.  The patient vomited and now is having trouble breathing

## 2024-06-03 ENCOUNTER — Encounter: Payer: Self-pay | Admitting: Neurology

## 2024-10-04 ENCOUNTER — Ambulatory Visit: Admitting: Neurology
